# Patient Record
Sex: Female | Born: 1970
Health system: Southern US, Community
[De-identification: ages and names within clinical notes are randomized; demographics above are authoritative.]

## PROBLEM LIST (undated history)

## (undated) DIAGNOSIS — M65332 Trigger finger, left middle finger: Secondary | ICD-10-CM

## (undated) DIAGNOSIS — Z9889 Other specified postprocedural states: Secondary | ICD-10-CM

## (undated) DIAGNOSIS — D3613 Benign neoplasm of peripheral nerves and autonomic nervous system of lower limb, including hip: Secondary | ICD-10-CM

## (undated) DIAGNOSIS — T8859XA Other complications of anesthesia, initial encounter: Secondary | ICD-10-CM

## (undated) DIAGNOSIS — E7800 Pure hypercholesterolemia, unspecified: Secondary | ICD-10-CM

## (undated) DIAGNOSIS — M47812 Spondylosis without myelopathy or radiculopathy, cervical region: Secondary | ICD-10-CM

## (undated) DIAGNOSIS — T7840XA Allergy, unspecified, initial encounter: Secondary | ICD-10-CM

## (undated) DIAGNOSIS — M26609 Unspecified temporomandibular joint disorder, unspecified side: Secondary | ICD-10-CM

## (undated) DIAGNOSIS — K219 Gastro-esophageal reflux disease without esophagitis: Secondary | ICD-10-CM

## (undated) DIAGNOSIS — Z8614 Personal history of Methicillin resistant Staphylococcus aureus infection: Secondary | ICD-10-CM

## (undated) DIAGNOSIS — F329 Major depressive disorder, single episode, unspecified: Secondary | ICD-10-CM

## (undated) DIAGNOSIS — E079 Disorder of thyroid, unspecified: Secondary | ICD-10-CM

## (undated) DIAGNOSIS — R7303 Prediabetes: Secondary | ICD-10-CM

## (undated) DIAGNOSIS — E039 Hypothyroidism, unspecified: Secondary | ICD-10-CM

## (undated) DIAGNOSIS — I10 Essential (primary) hypertension: Secondary | ICD-10-CM

## (undated) DIAGNOSIS — Z1371 Encounter for nonprocreative screening for genetic disease carrier status: Secondary | ICD-10-CM

## (undated) DIAGNOSIS — J309 Allergic rhinitis, unspecified: Secondary | ICD-10-CM

## (undated) DIAGNOSIS — F32A Depression, unspecified: Secondary | ICD-10-CM

## (undated) DIAGNOSIS — R112 Nausea with vomiting, unspecified: Secondary | ICD-10-CM

## (undated) DIAGNOSIS — F909 Attention-deficit hyperactivity disorder, unspecified type: Secondary | ICD-10-CM

## (undated) DIAGNOSIS — K449 Diaphragmatic hernia without obstruction or gangrene: Secondary | ICD-10-CM

## (undated) DIAGNOSIS — T4145XA Adverse effect of unspecified anesthetic, initial encounter: Secondary | ICD-10-CM

## (undated) DIAGNOSIS — D649 Anemia, unspecified: Secondary | ICD-10-CM

## (undated) DIAGNOSIS — E063 Autoimmune thyroiditis: Secondary | ICD-10-CM

## (undated) HISTORY — PX: CARPAL TUNNEL RELEASE: SHX101

## (undated) HISTORY — DX: Allergy, unspecified, initial encounter: T78.40XA

## (undated) HISTORY — DX: Benign neoplasm of peripheral nerves and autonomic nervous system of lower limb, including hip: D36.13

## (undated) HISTORY — DX: Other specified postprocedural states: Z98.890

## (undated) HISTORY — PX: FACIAL RECONSTRUCTION SURGERY: SHX631

## (undated) HISTORY — PX: APPENDECTOMY: SHX54

## (undated) HISTORY — PX: NASAL SINUS SURGERY: SHX719

## (undated) HISTORY — PX: OTHER SURGICAL HISTORY: SHX169

## (undated) HISTORY — DX: Spondylosis without myelopathy or radiculopathy, cervical region: M47.812

## (undated) HISTORY — DX: Depression, unspecified: F32.A

## (undated) HISTORY — PX: KNEE SURGERY: SHX244

## (undated) HISTORY — DX: Unspecified temporomandibular joint disorder, unspecified side: M26.609

## (undated) HISTORY — PX: BREAST SURGERY: SHX581

## (undated) HISTORY — PX: HYSTEROSCOPY WITH D & C: SHX1775

## (undated) HISTORY — PX: OOPHORECTOMY: SHX86

## (undated) HISTORY — DX: Diaphragmatic hernia without obstruction or gangrene: K44.9

## (undated) HISTORY — PX: HALLUX VALGUS CORRECTION: SUR315

## (undated) HISTORY — PX: EXTERNAL FRONTAL ETHMOIDECTOMY: SHX1555

## (undated) HISTORY — DX: Major depressive disorder, single episode, unspecified: F32.9

## (undated) HISTORY — DX: Attention-deficit hyperactivity disorder, unspecified type: F90.9

## (undated) HISTORY — PX: WISDOM TOOTH EXTRACTION: SHX21

## (undated) HISTORY — DX: Disorder of thyroid, unspecified: E07.9

## (undated) HISTORY — PX: DILATION AND CURETTAGE OF UTERUS: SHX78

## (undated) HISTORY — DX: Encounter for nonprocreative screening for genetic disease carrier status: Z13.71

## (undated) HISTORY — PX: METATARSAL OSTEOTOMY: SHX1641

## (undated) HISTORY — PX: CHOLECYSTECTOMY: SHX55

---

## 2001-05-12 DIAGNOSIS — Z9889 Other specified postprocedural states: Secondary | ICD-10-CM

## 2001-05-12 HISTORY — DX: Other specified postprocedural states: Z98.890

## 2004-10-28 ENCOUNTER — Ambulatory Visit: Payer: Self-pay

## 2007-03-22 ENCOUNTER — Ambulatory Visit: Payer: Self-pay

## 2008-01-17 ENCOUNTER — Ambulatory Visit: Payer: Self-pay | Admitting: Otolaryngology

## 2008-11-07 ENCOUNTER — Ambulatory Visit: Payer: Self-pay

## 2009-08-06 ENCOUNTER — Ambulatory Visit: Payer: Self-pay | Admitting: General Surgery

## 2009-10-07 ENCOUNTER — Ambulatory Visit: Payer: Self-pay | Admitting: General Surgery

## 2010-08-12 ENCOUNTER — Ambulatory Visit: Payer: Self-pay

## 2010-08-12 DIAGNOSIS — J069 Acute upper respiratory infection, unspecified: Secondary | ICD-10-CM

## 2010-08-20 ENCOUNTER — Encounter: Payer: Self-pay | Admitting: Internal Medicine

## 2010-08-27 NOTE — Letter (Signed)
Summary: surgeries  surgeries   Imported By: Rosine Beat 08/20/2010 12:24:13  _____________________________________________________________________  External Attachment:    Type:   Image     Comment:   External Document

## 2010-08-27 NOTE — Letter (Signed)
Summary: allergies to medications  allergies to medications   Imported By: Rosine Beat 08/20/2010 12:23:20  _____________________________________________________________________  External Attachment:    Type:   Image     Comment:   External Document

## 2010-08-27 NOTE — Progress Notes (Signed)
Summary: office visit 08/12/10  office visit 08/12/10   Imported By: Erskine Squibb Breitmeier 08/20/2010 12:19:16  _____________________________________________________________________  External Attachment:    Type:   Image     Comment:   External Document

## 2010-08-27 NOTE — Progress Notes (Signed)
Summary: office visit 08/12/10  office visit 08/12/10   Imported By: Erskine Squibb Breitmeier 08/20/2010 12:20:06  _____________________________________________________________________  External Attachment:    Type:   Image     Comment:   External Document

## 2010-09-12 ENCOUNTER — Telehealth: Payer: Self-pay | Admitting: Family Medicine

## 2010-09-14 ENCOUNTER — Telehealth: Payer: Self-pay | Admitting: Family Medicine

## 2010-09-17 NOTE — Progress Notes (Signed)
  Phone Note Call from Patient   Caller: Patient Call For: Dr. Hyacinth Meeker Summary of Call: The patient requested to get a Zpack as she had discussed with Dr. Hyacinth Meeker if symptoms got any worse or didn't improve.  Initial call taken by: Eugenio Hoes,  September 12, 2010 1:34 PM  Follow-up for Phone Call        TRIED TO CALL PATIENT BUT NUMBER WAS DISCONNECTED THERE ARE KNOW OTHER PHONE NUMBERS IN SYSTEM FOR PATIENT  Follow-up by: Eugenio Hoes,  September 12, 2010 1:34 PM

## 2010-09-22 NOTE — Progress Notes (Signed)
  Phone Note Call from Patient   Reason for Call: Insurance Question Summary of Call: Patient was seen by Dr Hyacinth Meeker a week ago but still having problem with sinus. patient came in wanted to know could she still get some medication for her sinus.Marland Kitchen spoke with doctor advised her will send a zpack.. patient stated please send to [facility] phne number is [phone] Initial call taken by: Eugenio Hoes,  September 14, 2010 12:09 PM    New/Updated Medications: AZITHROMYCIN 250 MG TABS (AZITHROMYCIN) take 2 tabs by mouth on day 1, then 1 tab by mouth daily until completed Prescriptions: AZITHROMYCIN 250 MG TABS (AZITHROMYCIN) take 2 tabs by mouth on day 1, then 1 tab by mouth daily until completed  #6 x 0   Entered and Authorized by:   Standley Dakins MD   Signed by:   Standley Dakins MD on 09/14/2010   Method used:   Print then Give to Patient   RxID:   [id number]  It was telephoned to [facility] at [phone] C. Cyndie Mull, MD, CDE, FAAFP

## 2010-09-23 ENCOUNTER — Ambulatory Visit: Payer: Self-pay | Admitting: General Surgery

## 2010-09-24 ENCOUNTER — Ambulatory Visit: Payer: Self-pay | Admitting: General Surgery

## 2011-03-25 ENCOUNTER — Telehealth: Payer: Self-pay | Admitting: Internal Medicine

## 2011-03-25 NOTE — Telephone Encounter (Signed)
Patient has scheduled appt for next wed.

## 2011-03-25 NOTE — Telephone Encounter (Signed)
pitting edmina in both legs/nodlule in neck swollen pt would like to come in tomorrow or Tuesday or wednesday

## 2011-03-31 ENCOUNTER — Ambulatory Visit (INDEPENDENT_AMBULATORY_CARE_PROVIDER_SITE_OTHER): Payer: 59 | Admitting: Internal Medicine

## 2011-03-31 ENCOUNTER — Encounter: Payer: Self-pay | Admitting: Internal Medicine

## 2011-03-31 VITALS — BP 135/88 | HR 76 | Temp 98.4°F | Resp 16 | Ht 62.5 in | Wt 169.5 lb

## 2011-03-31 DIAGNOSIS — R609 Edema, unspecified: Secondary | ICD-10-CM

## 2011-03-31 DIAGNOSIS — R030 Elevated blood-pressure reading, without diagnosis of hypertension: Secondary | ICD-10-CM

## 2011-03-31 DIAGNOSIS — R51 Headache: Secondary | ICD-10-CM

## 2011-03-31 DIAGNOSIS — Z0000 Encounter for general adult medical examination without abnormal findings: Secondary | ICD-10-CM

## 2011-03-31 DIAGNOSIS — N926 Irregular menstruation, unspecified: Secondary | ICD-10-CM

## 2011-03-31 DIAGNOSIS — R635 Abnormal weight gain: Secondary | ICD-10-CM

## 2011-03-31 MED ORDER — TRAMADOL HCL 50 MG PO TABS: 50.0000 mg | ORAL_TABLET | Freq: Four times a day (QID) | ORAL | Status: DC | PRN

## 2011-03-31 MED ORDER — TRIAMTERENE-HCTZ 75-50 MG PO TABS: 1.0000 | ORAL_TABLET | Freq: Every day | ORAL | Status: DC

## 2011-04-01 ENCOUNTER — Encounter: Payer: Self-pay | Admitting: Internal Medicine

## 2011-04-01 LAB — CBC WITH DIFFERENTIAL/PLATELET
Basophils Absolute: 0 10*3/uL (ref 0.0–0.1)
Eosinophils Absolute: 0 10*3/uL (ref 0.0–0.7)
Hemoglobin: 11.1 g/dL — ABNORMAL LOW (ref 12.0–15.0)
Lymphocytes Relative: 24.2 % (ref 12.0–46.0)
MCHC: 32.6 g/dL (ref 30.0–36.0)
Monocytes Relative: 6.1 % (ref 3.0–12.0)
Neutrophils Relative %: 69.3 % (ref 43.0–77.0)
Platelets: 289 10*3/uL (ref 150.0–400.0)
RDW: 14.8 % — ABNORMAL HIGH (ref 11.5–14.6)

## 2011-04-01 LAB — LUTEINIZING HORMONE: LH: 6.57 m[IU]/mL

## 2011-04-01 LAB — LIPID PANEL: Cholesterol: 233 mg/dL — ABNORMAL HIGH (ref 0–200)

## 2011-04-01 LAB — TSH: TSH: 2.27 u[IU]/mL (ref 0.35–5.50)

## 2011-04-01 LAB — COMPREHENSIVE METABOLIC PANEL
AST: 21 U/L (ref 0–37)
BUN: 9 mg/dL (ref 6–23)
Calcium: 8.6 mg/dL (ref 8.4–10.5)
Chloride: 99 mEq/L (ref 96–112)
Creatinine, Ser: 0.6 mg/dL (ref 0.4–1.2)
GFR: 126.13 mL/min (ref >60.00)
Glucose, Bld: 111 mg/dL — ABNORMAL HIGH (ref 70–99)

## 2011-04-01 LAB — POCT URINALYSIS DIPSTICK
Ketones, UA: NEGATIVE
Leukocytes, UA: NEGATIVE
Nitrite, UA: NEGATIVE
Protein, UA: NEGATIVE
Urobilinogen, UA: 0.2

## 2011-04-01 LAB — MICROALBUMIN / CREATININE URINE RATIO
Creatinine,U: 16.9 mg/dL
Microalb Creat Ratio: 0.6 mg/g (ref 0.0–30.0)
Microalb, Ur: 0.1 mg/dL (ref 0.0–1.9)

## 2011-04-01 LAB — FOLLICLE STIMULATING HORMONE: FSH: 11.4 m[IU]/mL

## 2011-04-03 ENCOUNTER — Encounter: Payer: Self-pay | Admitting: Internal Medicine

## 2011-04-03 DIAGNOSIS — I10 Essential (primary) hypertension: Secondary | ICD-10-CM | POA: Insufficient documentation

## 2011-04-03 DIAGNOSIS — Z0000 Encounter for general adult medical examination without abnormal findings: Secondary | ICD-10-CM | POA: Insufficient documentation

## 2011-04-03 DIAGNOSIS — R635 Abnormal weight gain: Secondary | ICD-10-CM | POA: Insufficient documentation

## 2011-04-03 DIAGNOSIS — R609 Edema, unspecified: Secondary | ICD-10-CM | POA: Insufficient documentation

## 2011-04-03 NOTE — Progress Notes (Signed)
  Subjective:    Patient ID: Mandy Torres, female    DOB: 1970/07/15, 40 y.o.   MRN: [id number]  HPI  40 yo white female with history of facial reconstruction surgery, sinus surgery last seen June 2011 for sinusitis presents to reestablish primary care.  Has had increased fatigue, mild LE edema, menstrual irregularity and weight gain over the past year with no endocrine workup thus far.     Review of Systems  Constitutional: Positive for fatigue and unexpected weight change. Negative for fever and chills.  HENT: Negative for hearing loss, ear pain, nosebleeds, congestion, sore throat, facial swelling, rhinorrhea, sneezing, mouth sores, trouble swallowing, neck pain, neck stiffness, voice change, postnasal drip, sinus pressure, tinnitus and ear discharge.   Eyes: Negative for pain, discharge, redness and visual disturbance.  Respiratory: Negative for cough, chest tightness, shortness of breath, wheezing and stridor.   Cardiovascular: Positive for leg swelling. Negative for chest pain and palpitations.  Genitourinary: Positive for menstrual problem.  Musculoskeletal: Negative for myalgias and arthralgias.  Skin: Negative for color change and rash.  Neurological: Negative for dizziness, weakness, light-headedness and headaches.  Hematological: Negative for adenopathy.       Objective:   Physical Exam  Constitutional: She is oriented to person, place, and time. She appears well-developed and well-nourished.  HENT:  Mouth/Throat: Oropharynx is clear and moist.  Eyes: EOM are normal. Pupils are equal, round, and reactive to light. No scleral icterus.  Neck: Normal range of motion. Neck supple. No JVD present. No thyromegaly present.  Cardiovascular: Normal rate, regular rhythm, normal heart sounds and intact distal pulses.   Pulmonary/Chest: Effort normal and breath sounds normal.  Abdominal: Soft. Bowel sounds are normal. She exhibits no mass. There is no tenderness.  Musculoskeletal:  Normal range of motion. She exhibits no edema.  Lymphadenopathy:    She has no cervical adenopathy.  Neurological: She is alert and oriented to person, place, and time.  Skin: Skin is warm and dry.  Psychiatric: She has a normal mood and affect.          Assessment & Plan:

## 2011-04-03 NOTE — Assessment & Plan Note (Signed)
13 lb weight gain noted since last visit. BMI now 30.   Will screen for diabetes, thyroid, consider screening for sleep apnea if she is developing new onset hypertension as well.

## 2011-04-03 NOTE — Assessment & Plan Note (Signed)
New onset LE,  With weight gain and elevated bp concurrent.  Will check urine for protein ,  Consider vascular eval for venous insufficiency given sedentary job.

## 2011-04-09 ENCOUNTER — Telehealth: Payer: Self-pay | Admitting: Internal Medicine

## 2011-04-09 NOTE — Telephone Encounter (Signed)
Patient called and was a little upset because she got the letter from her lab results that we had sent her.   She stated she has a few concerns, it needs to be noted that she was not fasting the day she had her labs done, and she had requested a C Reactive Protein be drawn and it was not, and she also said she let the doctor she works for review her labs and they told her one lab indicates that she could be anemic, and she was wondering why that was not addressed with her.  She also stated she requested that a lab be drawn to see if she was perimenopausal, and she did not see that one either.  Please advise.

## 2011-04-21 ENCOUNTER — Encounter: Payer: Self-pay | Admitting: *Deleted

## 2011-04-22 ENCOUNTER — Encounter: Payer: Self-pay | Admitting: Internal Medicine

## 2011-05-04 ENCOUNTER — Other Ambulatory Visit: Payer: Self-pay | Admitting: Internal Medicine

## 2011-05-04 DIAGNOSIS — R609 Edema, unspecified: Secondary | ICD-10-CM

## 2011-05-04 DIAGNOSIS — N926 Irregular menstruation, unspecified: Secondary | ICD-10-CM

## 2011-05-04 DIAGNOSIS — Z0000 Encounter for general adult medical examination without abnormal findings: Secondary | ICD-10-CM

## 2011-05-04 DIAGNOSIS — R030 Elevated blood-pressure reading, without diagnosis of hypertension: Secondary | ICD-10-CM

## 2011-05-04 DIAGNOSIS — R635 Abnormal weight gain: Secondary | ICD-10-CM

## 2011-05-04 DIAGNOSIS — R51 Headache: Secondary | ICD-10-CM

## 2011-05-04 MED ORDER — TRAMADOL HCL 50 MG PO TABS: 50.0000 mg | ORAL_TABLET | Freq: Four times a day (QID) | ORAL | Status: DC | PRN

## 2011-06-19 ENCOUNTER — Other Ambulatory Visit: Payer: Self-pay

## 2011-06-19 ENCOUNTER — Ambulatory Visit (HOSPITAL_COMMUNITY)
Admission: RE | Admit: 2011-06-19 | Discharge: 2011-06-19 | Disposition: A | Discharge status: Home / Self Care | Payer: 59 | Source: Ambulatory Visit | Attending: Family Medicine | Admitting: Family Medicine

## 2011-06-19 ENCOUNTER — Other Ambulatory Visit (HOSPITAL_COMMUNITY): Payer: Self-pay | Admitting: *Deleted

## 2011-06-19 DIAGNOSIS — M542 Cervicalgia: Secondary | ICD-10-CM | POA: Insufficient documentation

## 2011-06-19 DIAGNOSIS — R52 Pain, unspecified: Secondary | ICD-10-CM

## 2011-06-23 ENCOUNTER — Ambulatory Visit (INDEPENDENT_AMBULATORY_CARE_PROVIDER_SITE_OTHER): Payer: 59 | Admitting: Family Medicine

## 2011-06-23 ENCOUNTER — Encounter: Payer: Self-pay | Admitting: Family Medicine

## 2011-06-23 VITALS — BP 120/72 | HR 85 | Temp 98.5°F | Ht 62.5 in | Wt 160.2 lb

## 2011-06-23 DIAGNOSIS — F329 Major depressive disorder, single episode, unspecified: Secondary | ICD-10-CM

## 2011-06-23 DIAGNOSIS — G43909 Migraine, unspecified, not intractable, without status migrainosus: Secondary | ICD-10-CM

## 2011-06-23 DIAGNOSIS — E785 Hyperlipidemia, unspecified: Secondary | ICD-10-CM

## 2011-06-23 DIAGNOSIS — F909 Attention-deficit hyperactivity disorder, unspecified type: Secondary | ICD-10-CM

## 2011-06-23 DIAGNOSIS — E7849 Other hyperlipidemia: Secondary | ICD-10-CM | POA: Insufficient documentation

## 2011-06-23 DIAGNOSIS — R030 Elevated blood-pressure reading, without diagnosis of hypertension: Secondary | ICD-10-CM

## 2011-06-23 NOTE — Patient Instructions (Addendum)
It was so nice to meet you. Please come in for a nurse visit for fasting labs in 3 months. Have a Merry Christmas.

## 2011-06-23 NOTE — Progress Notes (Signed)
Subjective:    Patient ID: Mandy Torres, female    DOB: 14-Jan-1971, 40 y.o.   MRN: [id number]  HPI   40 yo female, previous pt of Dr. Darrick Huntsman here to establish care.  DJD of spine- per pt, C4-C6.  Followed by neurosurg at [facility]. Now taking Nabumetone 500 mg twice daily, Gabapentin 100 mg three times daily Has follow up scheduled in January.  Less radiculopathy.  Migraine headaches- followed by Dr[facility] Neale [facility]. Couldn't tolerate topamax- caused cognitive issues. Getting trigger point injections, seems to be helping. Taking Compazine as needed for headache.  ADHD- recently diagnosed. Followed by Valinda Hoar. Started Amphetamine yesterday.  HLD- Lab Results  Component Value Date   CHOL 233* 04/01/2011   HDL 50.10 04/01/2011   LDLDIRECT 171.8 04/01/2011   TRIG 140.0 04/01/2011   CHOLHDL 5 04/01/2011   Working on diet. Does not want to take medication if possible.  Patient Active Problem List  Diagnoses  . Weight gain  . Elevated blood pressure reading without diagnosis of hypertension  . Edema  . Routine adult health maintenance  . Hyperlipidemia, familial, high LDL   Past Medical History  Diagnosis Date  . History of breast biopsy 05/2001  . Allergy   . ADHD (attention deficit hyperactivity disorder)     Valinda Hoar  . Depression   . DJD (degenerative joint disease), cervical     neurosurg - Dr. Darcus Austin at [facility]  . Migraine    Past Surgical History  Procedure Date  . Cholecystectomy   . Knee surgery 11/97 and 4/93    right  . Oophorectomy   . Appendectomy   . Nasal sinus surgery 2006 and 1998    deviated septum, ethmoids, Dr. Sherrie Mustache then Willeen Cass  . Facial reconstruction surgery 2/88, 9/88, 2/89  . Breast surgery     normal biopsy 2002, left   History  Substance Use Topics  . Smoking status: Never Smoker   . Smokeless tobacco: Never Used  . Alcohol Use: Yes     social   Family History  Problem Relation Age of Onset  .  Cancer Mother     Breast  . Cancer Sister     Half sister, Breast  . Cancer Maternal Aunt     Breast   Allergies  Allergen Reactions  . Avelox (Moxifloxacin Hcl In Nacl) Hives  . Levaquin Nausea And Vomiting  . Penicillins Hives  . Percocet (Oxycodone-Acetaminophen) Nausea And Vomiting    Also Endocet  . Ranitidine Nausea And Vomiting  . Sulfa Antibiotics Hives  . Topamax     congnitive impairmant  . Valium     Hyperactivity    Current Outpatient Prescriptions on File Prior to Visit  Medication Sig Dispense Refill  . Omeprazole-Sodium Bicarbonate (ZEGERID) 20-1100 MG CAPS Take 1 capsule by mouth daily.         The PMH, PSH, Social History, Family History, Medications, and allergies have been reviewed in [facility], and have been updated if relevant.   Review of Systems  See HPI Constitutional: Negative for fever and chills.  HENT: Negative for hearing loss, ear pain, nosebleeds, congestion, sore throat, facial swelling, rhinorrhea, sneezing, mouth sores, trouble swallowing, neck pain, neck stiffness, voice change, postnasal drip, sinus pressure, tinnitus and ear discharge.   Eyes: Negative for pain, discharge, redness and visual disturbance.  Respiratory: Negative for cough, chest tightness, shortness of breath, wheezing and stridor.   Cardiovascular:  Negative for chest pain and palpitations.  Musculoskeletal:  Negative for myalgias and arthralgias.  Skin: Negative for color change and rash.  Neurological: Negative for dizziness, weakness, light-headedness and headaches.  Hematological: Negative for adenopathy.       Objective:   Physical Exam  BP 120/72  Pulse 85  Temp(Src) 98.5 F (36.9 C) (Oral)  Ht 5' 2.5" (1.588 m)  Wt 160 lb 4 oz (72.689 kg)  BMI 28.84 kg/m2  LMP 05/24/2011  Constitutional: She is oriented to person, place, and time. She appears well-developed and well-nourished.  HENT:  Mouth/Throat: Oropharynx is clear and moist.  Eyes: EOM are normal. Pupils  are equal, round, and reactive to light. No scleral icterus.  Neck: Normal range of motion. Neck supple. No JVD present. No thyromegaly present.  Cardiovascular: Normal rate, regular rhythm, normal heart sounds and intact distal pulses.   Pulmonary/Chest: Effort normal and breath sounds normal.  Abdominal: Soft. Bowel sounds are normal. She exhibits no mass. There is no tenderness.  Musculoskeletal: Normal range of motion. She exhibits no edema.  Lymphadenopathy:    She has no cervical adenopathy.  Neurological: She is alert and oriented to person, place, and time.  Skin: Skin is warm and dry.  Psychiatric: She has a normal mood and affect.     Assessment & Plan:    1. Hyperlipidemia, familial, high LDL  New.  Working on diet. Recheck labs in 3 months. Lipid Profile, Basic metabolic panel  2. Elevated blood pressure reading without diagnosis of hypertension  BP normal today.   3. ADHD (attention deficit hyperactivity disorder)  Just started stimulant.  Keep follow up with Valinda Hoar.   4. Depression  Stable.   5. Migraine  Improved with trigger point injections.

## 2011-08-18 ENCOUNTER — Other Ambulatory Visit (HOSPITAL_COMMUNITY): Payer: Self-pay | Admitting: *Deleted

## 2011-08-18 DIAGNOSIS — K1120 Sialoadenitis, unspecified: Secondary | ICD-10-CM

## 2011-08-23 ENCOUNTER — Other Ambulatory Visit: Payer: Self-pay | Admitting: *Deleted

## 2011-08-23 ENCOUNTER — Other Ambulatory Visit (HOSPITAL_COMMUNITY): Payer: Self-pay | Admitting: *Deleted

## 2011-08-23 ENCOUNTER — Ambulatory Visit (HOSPITAL_COMMUNITY)
Admission: RE | Admit: 2011-08-23 | Discharge: 2011-08-23 | Disposition: A | Discharge status: Home / Self Care | Payer: 59 | Source: Ambulatory Visit | Attending: Family Medicine | Admitting: Family Medicine

## 2011-08-23 ENCOUNTER — Encounter (HOSPITAL_COMMUNITY): Payer: Self-pay

## 2011-08-23 DIAGNOSIS — K1120 Sialoadenitis, unspecified: Secondary | ICD-10-CM

## 2011-08-23 MED ORDER — IOHEXOL 300 MG/ML  SOLN: 75.0000 mL | Freq: Once | INTRAMUSCULAR | Status: AC | PRN

## 2011-08-23 MED ORDER — RIZATRIPTAN BENZOATE 10 MG PO TABS: 10.0000 mg | ORAL_TABLET | ORAL | Status: DC | PRN

## 2011-08-23 NOTE — Telephone Encounter (Signed)
Patient advised via telephone, Rx sent to pharmacy by Dr. Dayton Martes.

## 2011-09-27 ENCOUNTER — Other Ambulatory Visit: Payer: Self-pay | Admitting: *Deleted

## 2011-09-27 MED ORDER — RIZATRIPTAN BENZOATE 10 MG PO TABS: 10.0000 mg | ORAL_TABLET | ORAL | Status: DC | PRN

## 2011-09-27 NOTE — Telephone Encounter (Signed)
Received faxed refill request from pharmacy. Is it okay to refill medication? 

## 2011-11-08 ENCOUNTER — Other Ambulatory Visit: Payer: Self-pay | Admitting: *Deleted

## 2011-11-08 MED ORDER — RIZATRIPTAN BENZOATE 10 MG PO TABS: 10.0000 mg | ORAL_TABLET | ORAL | Status: DC | PRN

## 2011-11-08 NOTE — Telephone Encounter (Signed)
Faxed request from [facility], last filled 09/27/11 for # 9.

## 2011-12-15 ENCOUNTER — Ambulatory Visit: Payer: Self-pay | Admitting: General Surgery

## 2011-12-23 ENCOUNTER — Other Ambulatory Visit (HOSPITAL_COMMUNITY): Payer: Self-pay | Admitting: General Surgery

## 2011-12-23 DIAGNOSIS — R2240 Localized swelling, mass and lump, unspecified lower limb: Secondary | ICD-10-CM

## 2011-12-28 ENCOUNTER — Other Ambulatory Visit: Payer: Self-pay | Admitting: *Deleted

## 2011-12-28 MED ORDER — RIZATRIPTAN BENZOATE 10 MG PO TABS: 10.0000 mg | ORAL_TABLET | ORAL | Status: DC | PRN

## 2011-12-28 NOTE — Telephone Encounter (Signed)
Medication phoned to pharmacy.  

## 2011-12-28 NOTE — Telephone Encounter (Signed)
Received faxed refill request from pharmacy. Is it okay to refill medication? 

## 2011-12-31 ENCOUNTER — Encounter: Payer: Self-pay | Admitting: Family Medicine

## 2011-12-31 ENCOUNTER — Ambulatory Visit (INDEPENDENT_AMBULATORY_CARE_PROVIDER_SITE_OTHER): Payer: 59 | Admitting: Family Medicine

## 2011-12-31 ENCOUNTER — Ambulatory Visit (HOSPITAL_COMMUNITY)
Admission: RE | Admit: 2011-12-31 | Discharge: 2011-12-31 | Disposition: A | Discharge status: Home / Self Care | Payer: 59 | Source: Ambulatory Visit | Attending: General Surgery | Admitting: General Surgery

## 2011-12-31 VITALS — BP 110/84 | HR 56 | Temp 97.9°F | Wt 154.0 lb

## 2011-12-31 DIAGNOSIS — R2240 Localized swelling, mass and lump, unspecified lower limb: Secondary | ICD-10-CM

## 2011-12-31 DIAGNOSIS — K629 Disease of anus and rectum, unspecified: Secondary | ICD-10-CM

## 2011-12-31 DIAGNOSIS — R229 Localized swelling, mass and lump, unspecified: Secondary | ICD-10-CM | POA: Insufficient documentation

## 2011-12-31 DIAGNOSIS — K6289 Other specified diseases of anus and rectum: Secondary | ICD-10-CM

## 2011-12-31 MED ORDER — GADOBENATE DIMEGLUMINE 529 MG/ML IV SOLN
14.0000 mL | Freq: Once | INTRAVENOUS | Status: AC | PRN
  Administered 2011-12-31: 14 mL via INTRAVENOUS

## 2011-12-31 MED ORDER — DOXYCYCLINE HYCLATE 100 MG PO TABS: 100.0000 mg | ORAL_TABLET | Freq: Two times a day (BID) | ORAL | Status: AC

## 2011-12-31 MED ORDER — HYDROCORTISONE 2.5 % RE CREA: TOPICAL_CREAM | Freq: Two times a day (BID) | RECTAL | Status: AC

## 2011-12-31 NOTE — Progress Notes (Signed)
Subjective:    Patient ID: Mandy Torres, female    DOB: January 07, 1971, 41 y.o.   MRN: [id number]  HPI  41 yo here for ?swollen knot on rectum x 1 week.  Not painful or bleeding. She felt it when she was wiping. No h/o hemorrhoids. She does have frequent bouts of constipation.  Never had anything like this in past. Does have h/o MRSA.  Patient Active Problem List  Diagnosis  . Weight gain  . Elevated blood pressure reading without diagnosis of hypertension  . Edema  . Routine adult health maintenance  . Hyperlipidemia, familial, high LDL  . ADHD (attention deficit hyperactivity disorder)  . Depression  . Migraine   Past Medical History  Diagnosis Date  . History of breast biopsy 05/2001  . Allergy   . ADHD (attention deficit hyperactivity disorder)     Valinda Hoar  . Depression   . DJD (degenerative joint disease), cervical     neurosurg - [facility] Darcus Austin at [facility]  . Migraine    Past Surgical History  Procedure Date  . Cholecystectomy   . Knee surgery 11/97 and 4/93    right  . Oophorectomy   . Appendectomy   . Nasal sinus surgery 2006 and 1998    deviated septum, ethmoids, Dr. Sherrie Mustache then Willeen Cass  . Facial reconstruction surgery 2/88, 9/88, 2/89  . Breast surgery     normal biopsy 2002, left   History  Substance Use Topics  . Smoking status: Never Smoker   . Smokeless tobacco: Never Used  . Alcohol Use: Yes     social   Family History  Problem Relation Age of Onset  . Cancer Mother     Breast  . Cancer Sister     Half sister, Breast  . Cancer Maternal Aunt     Breast   Allergies  Allergen Reactions  . Avelox (Moxifloxacin Hcl In Nacl) Hives  . Levofloxacin Nausea And Vomiting  . Penicillins Hives  . Percocet (Oxycodone-Acetaminophen) Nausea And Vomiting    Also Endocet  . Ranitidine Nausea And Vomiting  . Sulfa Antibiotics Hives  . Topamax     congnitive impairmant  . Valium     Hyperactivity    Current Outpatient Prescriptions on File  Prior to Visit  Medication Sig Dispense Refill  . chlorproMAZINE (THORAZINE) 25 MG tablet Take 1-2 tablets three times daily as needed for headache       . gabapentin (NEURONTIN) 100 MG capsule Take 100 mg by mouth 3 (three) times daily.        . methocarbamol (ROBAXIN) 750 MG tablet Take 750 mg by mouth 4 (four) times daily.        . nabumetone (RELAFEN) 500 MG tablet Take 500 mg by mouth 2 (two) times daily.        . norethindrone-ethinyl estradiol (MICROGESTIN,JUNEL,LOESTRIN) 1-20 MG-MCG tablet Take 1 tablet by mouth daily.        Maxwell Caul Bicarbonate (ZEGERID) 20-1100 MG CAPS Take 1 capsule by mouth daily.        . rizatriptan (MAXALT) 10 MG tablet Take 1 tablet (10 mg total) by mouth as needed. May repeat in 2 hours if needed  10 tablet  0  . venlafaxine (EFFEXOR-XR) 75 MG 24 hr capsule Take 75 mg by mouth 3 (three) times daily.         The PMH, PSH, Social History, Family History, Medications, and allergies have been reviewed in [facility], and have been updated if relevant.  Review of Systems See HPI No abdominal pain No changes in her bowels No fevers or chills    Objective:   Physical Exam BP 110/84  Pulse 56  Temp 97.9 F (36.6 C)  Wt 154 lb (69.854 kg)  General:  Well-developed,well-nourished,in no acute distress; alert,appropriate and cooperative throughout examination Head:  normocephalic and atraumatic.   Abdomen:  Bowel sounds positive,abdomen soft and non-tender without masses, organomegaly or hernias noted. Rectal:   No hemorrhoids visible, non palpable internally Area that she is complaining of is just inferior to rectum- when I press, a little tender     Assessment & Plan:   1. Abnormality of rectum    New- no abscess or hemorrhoid visible, very small area that is slightly raised? Will treat with anusol, sitz baths. Multiple abx allergies- will place on Doxy since it is Friday afternoon. If it does develop into an abscess, will need immediate  surgical referral. Pt will call on Monday with an update. The patient indicates understanding of these issues and agrees with the plan.

## 2011-12-31 NOTE — Patient Instructions (Addendum)
Good to see you.  Take warm sitz baths for 20 to 30 minutes, 3 to 4 times per day.   anusol cream to the area at least twice a day  Take antibiotics as directed.  Please call me on Monday with an update.

## 2012-01-03 ENCOUNTER — Telehealth: Payer: Self-pay | Admitting: *Deleted

## 2012-01-03 DIAGNOSIS — K611 Rectal abscess: Secondary | ICD-10-CM

## 2012-01-03 NOTE — Telephone Encounter (Signed)
Pt called to report that she is exactly the same as she was when she saw you on Friday.  She is still doing the sitz baths, no change at all.  Please advise.

## 2012-01-03 NOTE — Telephone Encounter (Signed)
Ok thanks for update.  Let's refer to surgery for possible peri rectal abscess.

## 2012-01-07 ENCOUNTER — Ambulatory Visit: Payer: 59 | Admitting: Family Medicine

## 2012-01-31 ENCOUNTER — Other Ambulatory Visit: Payer: Self-pay | Admitting: *Deleted

## 2012-01-31 MED ORDER — RIZATRIPTAN BENZOATE 10 MG PO TABS: 10.0000 mg | ORAL_TABLET | ORAL | Status: DC | PRN

## 2012-01-31 NOTE — Telephone Encounter (Signed)
Faxed refill request from [facility], last filled 12/28/11.

## 2012-02-21 ENCOUNTER — Other Ambulatory Visit: Payer: Self-pay | Admitting: *Deleted

## 2012-02-21 MED ORDER — RIZATRIPTAN BENZOATE 10 MG PO TABS: 10.0000 mg | ORAL_TABLET | ORAL | Status: DC | PRN

## 2012-02-21 NOTE — Telephone Encounter (Signed)
Faxed refill request from [facility] [place].  Last filled 11/08/11.

## 2012-04-07 ENCOUNTER — Other Ambulatory Visit: Payer: Self-pay | Admitting: *Deleted

## 2012-04-07 MED ORDER — RIZATRIPTAN BENZOATE 10 MG PO TABS: 10.0000 mg | ORAL_TABLET | ORAL | Status: DC | PRN

## 2012-04-07 NOTE — Telephone Encounter (Signed)
Faxed refill request from [facility] [place], last filled 02/23/12.

## 2012-05-22 ENCOUNTER — Other Ambulatory Visit: Payer: Self-pay | Admitting: *Deleted

## 2012-05-22 MED ORDER — RIZATRIPTAN BENZOATE 10 MG PO TABS: 10.0000 mg | ORAL_TABLET | ORAL | Status: DC | PRN

## 2012-10-13 ENCOUNTER — Encounter: Payer: Self-pay | Admitting: Family Medicine

## 2012-10-13 ENCOUNTER — Ambulatory Visit (INDEPENDENT_AMBULATORY_CARE_PROVIDER_SITE_OTHER): Payer: 59 | Admitting: Family Medicine

## 2012-10-13 VITALS — BP 130/84 | HR 72 | Temp 98.2°F | Wt 169.0 lb

## 2012-10-13 DIAGNOSIS — F329 Major depressive disorder, single episode, unspecified: Secondary | ICD-10-CM

## 2012-10-13 DIAGNOSIS — E7849 Other hyperlipidemia: Secondary | ICD-10-CM

## 2012-10-13 DIAGNOSIS — G43909 Migraine, unspecified, not intractable, without status migrainosus: Secondary | ICD-10-CM

## 2012-10-13 DIAGNOSIS — F909 Attention-deficit hyperactivity disorder, unspecified type: Secondary | ICD-10-CM

## 2012-10-13 DIAGNOSIS — Z131 Encounter for screening for diabetes mellitus: Secondary | ICD-10-CM

## 2012-10-13 DIAGNOSIS — E785 Hyperlipidemia, unspecified: Secondary | ICD-10-CM

## 2012-10-13 LAB — LIPID PANEL
Cholesterol: 213 mg/dL — ABNORMAL HIGH (ref 0–200)
Triglycerides: 167 mg/dL — ABNORMAL HIGH (ref <150)
VLDL: 33 mg/dL (ref 0–40)

## 2012-10-13 LAB — COMPREHENSIVE METABOLIC PANEL
AST: 19 U/L (ref 0–37)
Alkaline Phosphatase: 68 U/L (ref 39–117)
BUN: 8 mg/dL (ref 6–23)
Calcium: 9.2 mg/dL (ref 8.4–10.5)
Chloride: 99 mEq/L (ref 96–112)
Creat: 0.7 mg/dL (ref 0.50–1.10)

## 2012-10-13 MED ORDER — RIZATRIPTAN BENZOATE 10 MG PO TABS: 10.0000 mg | ORAL_TABLET | ORAL | Status: DC | PRN

## 2012-10-13 NOTE — Progress Notes (Signed)
Subjective:    Patient ID: Mandy Torres, female    DOB: 08-Jan-1971, 42 y.o.   MRN: [id number]  HPI   Very pleasant 42 yo female here for rx refills/follow up.     Migraine headaches- was followed by Mandy Torres [facility] but no longer seeing him. Felt the injections were no longer helping. Could not tolerate topamax- caused cognitive issues. Takes Maxalt for abortive therapy.  Was under good control until last 3-4 weeks, have several per week.  Can be associated with nauseated, mild photophobia.  She has been under more stress lately- office just started Epic and her aunt has metastatic breast CA.    Was recently started and Lamictal and Effexor for mood disorder.  Followed by Mandy Torres.  Feels this is improving her mood.  HLD- LDL quite high when last checked in 03/2011- she wanted to work on diet and did not return for follow up labs. Lab Results  Component Value Date   CHOL 233* 04/01/2011   HDL 50.10 04/01/2011   LDLDIRECT 171.8 04/01/2011   TRIG 140.0 04/01/2011   CHOLHDL 5 04/01/2011    Patient Active Problem List  Diagnosis  . Weight gain  . Elevated blood pressure reading without diagnosis of hypertension  . Edema  . Hyperlipidemia, familial, high LDL  . ADHD (attention deficit hyperactivity disorder)  . Depression  . Migraine  . Abnormality of rectum   Past Medical History  Diagnosis Date  . History of breast biopsy 05/2001  . Allergy   . ADHD (attention deficit hyperactivity disorder)     Mandy Torres  . Depression   . DJD (degenerative joint disease), cervical     neurosurg - [facility] Darcus Torres at [facility]  . Migraine    Past Surgical History  Procedure Laterality Date  . Cholecystectomy    . Knee surgery  11/97 and 4/93    right  . Oophorectomy    . Appendectomy    . Nasal sinus surgery  2006 and 1998    deviated septum, ethmoids, Dr. Sherrie Torres then Mandy Torres  . Facial reconstruction surgery  2/88, 9/88, 2/89  . Breast surgery      normal  biopsy 2002, left   History  Substance Use Topics  . Smoking status: Never Smoker   . Smokeless tobacco: Never Used  . Alcohol Use: Yes     Comment: social   Family History  Problem Relation Age of Onset  . Cancer Mother     Breast  . Cancer Sister     Half sister, Breast  . Cancer Maternal Aunt     Breast   Allergies  Allergen Reactions  . Avelox (Moxifloxacin Hcl In Nacl) Hives  . Levofloxacin Nausea And Vomiting  . Penicillins Hives  . Percocet (Oxycodone-Acetaminophen) Nausea And Vomiting    Also Endocet  . Ranitidine Nausea And Vomiting  . Sulfa Antibiotics Hives  . Topamax     congnitive impairmant  . Valium     Hyperactivity    Current Outpatient Prescriptions on File Prior to Visit  Medication Sig Dispense Refill  . chlorproMAZINE (THORAZINE) 25 MG tablet Take 1-2 tablets three times daily as needed for headache       . gabapentin (NEURONTIN) 100 MG capsule Take 100 mg by mouth 3 (three) times daily.        . methocarbamol (ROBAXIN) 750 MG tablet Take 750 mg by mouth 4 (four) times daily.        . nabumetone (RELAFEN)  500 MG tablet Take 500 mg by mouth 2 (two) times daily.        . norethindrone-ethinyl estradiol (MICROGESTIN,JUNEL,LOESTRIN) 1-20 MG-MCG tablet Take 1 tablet by mouth daily.        Mandy Torres Bicarbonate (ZEGERID) 20-1100 MG CAPS Take 1 capsule by mouth daily.        . rizatriptan (MAXALT) 10 MG tablet Take 1 tablet (10 mg total) by mouth as needed. May repeat in 2 hours if needed  9 tablet  3  . venlafaxine (EFFEXOR-XR) 75 MG 24 hr capsule Take 75 mg by mouth 3 (three) times daily.         No current facility-administered medications on file prior to visit.   The PMH, PSH, Social History, Family History, Medications, and allergies have been reviewed in [facility], and have been updated if relevant.   Review of Systems  See HPI Constitutional: Negative for fever and chills.  Eyes: Negative for pain, discharge, redness and visual disturbance.   Respiratory: Negative for cough, chest tightness, shortness of breath, wheezing and stridor.   Cardiovascular:  Negative for chest pain and palpitations.  Musculoskeletal: Negative for myalgias and arthralgias.       Objective:   Physical Exam  BP 130/84  Pulse 72  Temp(Src) 98.2 F (36.8 C)  Wt 169 lb (76.658 kg)  BMI 30.4 kg/m2  Constitutional: She is oriented to person, place, and time. She appears well-developed and well-nourished.  HENT:  Mouth/Throat: Oropharynx is clear and moist.  Eyes: EOM are normal. Pupils are equal, round, and reactive to light. No scleral icterus.  Neck: Normal range of motion. Neck supple. No JVD present. No thyromegaly present.  Cardiovascular: Normal rate, regular rhythm, normal heart sounds and intact distal pulses.   Pulmonary/Chest: Effort normal and breath sounds normal.  Abdominal: Soft. Bowel sounds are normal. She exhibits no mass. There is no tenderness.  Musculoskeletal: Normal range of motion. She exhibits no edema.  Lymphadenopathy:    She has no cervical adenopathy.  Neurological: She is alert and oriented to person, place, and time.  Skin: Skin is warm and dry.  Psychiatric: She has a normal mood and affect.     Assessment & Plan:   1. Depression Improved with psychotherapy, lamictal and effexor. I have asked her to send records from her psychiatrist office. The patient indicates understanding of these issues and agrees with the plan.   2. Hyperlipidemia, familial, high LDL Recheck labs today. - Comprehensive metabolic panel - Lipid Panel  3. Migraine Deteriorated.  Likely due to increased stressors and recent addition of SSRI and Lamictal.  She would like to give it a little more time, call back in 3 weeks if migraines continue to remain as frequent.  Cautioned about taking maxalt too frequently as it can lead to rebound headaches. The patient indicates understanding of these issues and agrees with the plan.   4.  Screening for diabetes mellitus  - Hemoglobin A1c  5.  Mood disorder- See #1

## 2012-10-13 NOTE — Patient Instructions (Signed)
Good to see you. Please call me in a few weeks with an update of your migraines.

## 2012-10-16 ENCOUNTER — Encounter: Payer: Self-pay | Admitting: Family Medicine

## 2013-02-28 ENCOUNTER — Other Ambulatory Visit: Payer: Self-pay | Admitting: *Deleted

## 2013-02-28 MED ORDER — RIZATRIPTAN BENZOATE 10 MG PO TABS: 10.0000 mg | ORAL_TABLET | ORAL | Status: DC | PRN

## 2013-02-28 NOTE — Telephone Encounter (Signed)
Faxed refill request from [facility], last filled 01/30/13.

## 2013-03-02 NOTE — Telephone Encounter (Signed)
Pt called to ck on status of refill; spoke with Laurelyn Sickle at [facility] pharmacy and did not receive refill; Medication phoned to [facility] pharmacy as instructed.Called Greggory Stallion at [facility] and cancelled rx there. Apologized to pt.

## 2013-04-11 ENCOUNTER — Ambulatory Visit: Payer: Self-pay

## 2013-05-04 ENCOUNTER — Ambulatory Visit: Payer: 59

## 2013-05-04 ENCOUNTER — Ambulatory Visit (INDEPENDENT_AMBULATORY_CARE_PROVIDER_SITE_OTHER): Payer: 59

## 2013-05-04 VITALS — BP 142/99 | HR 86 | Resp 18

## 2013-05-04 DIAGNOSIS — M79671 Pain in right foot: Secondary | ICD-10-CM

## 2013-05-04 DIAGNOSIS — M79672 Pain in left foot: Secondary | ICD-10-CM

## 2013-05-04 DIAGNOSIS — M79609 Pain in unspecified limb: Secondary | ICD-10-CM

## 2013-05-04 DIAGNOSIS — G5760 Lesion of plantar nerve, unspecified lower limb: Secondary | ICD-10-CM

## 2013-05-04 MED ORDER — MELOXICAM 15 MG PO TABS: 15.0000 mg | ORAL_TABLET | Freq: Every day | ORAL | Status: DC

## 2013-05-04 MED ORDER — TRIAMCINOLONE ACETONIDE 10 MG/ML IJ SUSP: 10.0000 mg | Freq: Once | INTRAMUSCULAR | Status: DC

## 2013-05-04 NOTE — Progress Notes (Signed)
  Subjective:    Patient ID: Mandy Torres, female    DOB: 23-Mar-1971, 42 y.o.   MRN: [id number] Both feet hurt and has been going on for 2 years and has inserts and hurts in the ball of both feet HPI patient presents this time for followup continues to have pain second third toes consistent with neuroma symptomology left foot and now is having problems in the right foot as well. Patient also having shin splint type symptomatology with activities which she addressed by stretching before activities.    Review of Systems  Constitutional: Negative.   HENT: Negative.   Eyes: Negative.   Respiratory: Negative.   Cardiovascular: Negative.   Gastrointestinal:       Reflux and a herinal hernia  Endocrine: Negative.   Genitourinary: Negative.   Musculoskeletal: Positive for back pain and neck pain.       Degenerative neck disease  Skin: Negative.   Allergic/Immunologic: Negative.   Neurological: Positive for numbness.       Carpel tunnel and i have migraines  Hematological: Negative.   Psychiatric/Behavioral:       Hyertension difficent-ADHD       Objective:   Physical Exam Neurovascular status is intact with pedal pulses palpable DP and PT +2/4. Refill timed 3-4 seconds bilateral. Skin temperature warm turgor normal no edema noted. There is pain and draped lateral compression second and third interspaces most significant second lateral x-rays reveal narrowing of the second interspace and touching the second third metatarsals on AP and oblique views. There is some slight splaying of the second and third toes on x-ray and clinical evaluation. These findings are consistent with early neuroma symptomology. Patient did try to attempt Mobic has been using the knee nabumetone continues to have pain with activities. Has been wearing orthotics which she brought with her for modification at this time.       Assessment & Plan:  Assessment persistent neuropsychology bilateral feet. Metatarsal pads  applied to the orthotics at this time. Steroid injection consisting of 10 mg Kenalog in 20 mg Xylocaine infiltrated to the second interspace bilateral feet. Recommend followup in place also prescription for Mobic 15 mg once daily we'll discontinue the Relafen. Followup in approximately 2 months for reevaluation may be candidate for steroid or alcohol sclerosing injections injections in the future.  Alvan Dame DPM

## 2013-05-04 NOTE — Addendum Note (Signed)
Addended by: Shelly Bombard on: 05/04/2013 03:21 PM   Modules accepted: Orders, Medications

## 2013-05-04 NOTE — Patient Instructions (Signed)
Morton's Neuroma Neuralgia (nerve pain) or neuroma (benign [non-cancerous] nerve tumor) may develop on any interdigital nerve. The interdigital nerves (nerves between digits) of the foot travel beneath and between the metatarsals (long bones of the fore foot) and pass the nerve endings to the toes. The third interdigital is a common place for a small neuroma to form called Morton's neuroma. Another nerve to be affected commonly is the fourth interdigital nerve. This would be in approximately in the area of the base or ball under the bottom of your fourth toe. This condition occurs more commonly in women and is usually on one side. It is usually first noticed by pain radiating (spreading) to the ball of the foot or to the toes. CAUSES The cause of interdigital neuralgia may be from low grade repetitive trauma (damage caused by an accident) as in activities causing a repeated pounding of the foot (running, jumping etc.). It is also caused by improper footwear or recent loss of the fatty padding on the bottom of the foot. TREATMENT  The condition often resolves (goes away) simply with decreasing activity if that is thought to be the cause. Proper shoes are beneficial. Orthotics (special foot support aids) such as a metatarsal bar are often beneficial. This condition usually responds to conservative therapy, however if surgery is necessary it usually brings complete relief. HOME CARE INSTRUCTIONS   Apply ice to the area of soreness for 15-20 minutes, 3-4 times per day, while awake for the first 2 days. Put ice in a plastic bag and place a towel between the bag of ice and your skin.  Only take over-the-counter or prescription medicines for pain, discomfort, or fever as directed by your caregiver. MAKE SURE YOU:   Understand these instructions.  Will watch your condition.  Will get help right away if you are not doing well or get worse. Document Released: 10/04/2000 Document Revised: 09/20/2011 Document  Reviewed: 06/28/2005 [employer] Patient Information 2014 [employer] ICE INSTRUCTIONS  Apply ice or cold pack to the affected area at least 3 times a day for 10-15 minutes each time.  You should also use ice after prolonged activity or vigorous exercise.  Do not apply ice longer than 20 minutes at one time.  Always keep a cloth between your skin and the ice pack to prevent burns.  Being consistent and following these instructions will help control your symptoms.  We suggest you purchase a gel ice pack because they are reusable and do bit leak.  Some of them are designed to wrap around the area.  Use the method that works best for you.  Here are some other suggestions for icing.   Use a frozen bag of peas or corn-inexpensive and molds well to your body, usually stays frozen for 10 to 20 minutes.  Wet a towel with cold water and squeeze out the excess until it's damp.  Place in a bag in the freezer for 20 minutes. Then remove and use. 

## 2013-06-26 ENCOUNTER — Ambulatory Visit (INDEPENDENT_AMBULATORY_CARE_PROVIDER_SITE_OTHER): Payer: 59 | Admitting: Internal Medicine

## 2013-06-26 ENCOUNTER — Encounter: Payer: Self-pay | Admitting: Internal Medicine

## 2013-06-26 VITALS — BP 124/82 | HR 95 | Temp 97.6°F | Wt 178.5 lb

## 2013-06-26 DIAGNOSIS — J019 Acute sinusitis, unspecified: Secondary | ICD-10-CM

## 2013-06-26 DIAGNOSIS — B379 Candidiasis, unspecified: Secondary | ICD-10-CM

## 2013-06-26 MED ORDER — CEFDINIR 300 MG PO CAPS: 300.0000 mg | ORAL_CAPSULE | Freq: Two times a day (BID) | ORAL | Status: DC

## 2013-06-26 MED ORDER — FLUCONAZOLE 150 MG PO TABS: 150.0000 mg | ORAL_TABLET | Freq: Once | ORAL | Status: DC

## 2013-06-26 NOTE — Patient Instructions (Signed)

## 2013-06-26 NOTE — Progress Notes (Signed)
Pre-visit discussion using our clinic review tool. No additional management support is needed unless otherwise documented below in the visit note.  

## 2013-06-26 NOTE — Progress Notes (Signed)
HPI  Pt presents to the clinic today with c/o sinus congestion and cough. This started about 9 days ago. She did see ENT 2 weeks ago for TMJ, she was given steroids at that time. She does have a headache, postnasal drip and yellow/brown nasal drainage. She has tried dayquil, nyquil, and honey with lemon juice without much relief. She denies fever, chills or body aches. She does have a history of allergies but not asthma. She is taking Patanase for her allergies. She has not had sick contacts.  Review of Systems    Past Medical History  Diagnosis Date  . History of breast biopsy 05/2001  . Allergy   . ADHD (attention deficit hyperactivity disorder)     Mandy Torres  . Depression   . DJD (degenerative joint disease), cervical     neurosurg - Dr. Darcus Austin at [facility]  . Migraine     Family History  Problem Relation Age of Onset  . Cancer Mother     Breast  . Cancer Sister     Half sister, Breast  . Cancer Maternal Aunt     Breast    History   Social History  . Marital Status: Single    Spouse Name: N/A    Number of Children: N/A  . Years of Education: N/A   Occupational History  . Not on file.   Social History Main Topics  . Smoking status: Never Smoker   . Smokeless tobacco: Never Used  . Alcohol Use: Yes     Comment: social  . Drug Use: No  . Sexual Activity: Not on file   Other Topics Concern  . Not on file   Social History Narrative  . No narrative on file    Allergies  Allergen Reactions  . Avelox [Moxifloxacin Hcl In Nacl] Hives  . Endocet [Oxycodone-Acetaminophen]   . Levofloxacin Nausea And Vomiting  . Penicillins Hives  . Percocet [Oxycodone-Acetaminophen] Nausea And Vomiting    Also Endocet  . Ranitidine Nausea And Vomiting  . Sulfa Antibiotics Hives  . Topamax     congnitive impairmant  . Valium     Hyperactivity      Constitutional: Positive headache, fatigue and fever. Denies fever or abrupt weight changes.  HEENT:  Positive eye pain,  pressure behind the eyes, facial pain, nasal congestion and sore throat. Denies eye redness, ear pain, ringing in the ears, wax buildup, runny nose or bloody nose. Respiratory: Positive cough. Denies difficulty breathing or shortness of breath.  Cardiovascular: Denies chest pain, chest tightness, palpitations or swelling in the hands or feet.   No other specific complaints in a complete review of systems (except as listed in HPI above).  Objective:    BP 124/82  Pulse 95  Temp(Src) 97.6 F (36.4 C) (Oral)  Wt 178 lb 8 oz (80.967 kg)  SpO2 98% Wt Readings from Last 3 Encounters:  06/26/13 178 lb 8 oz (80.967 kg)  10/13/12 169 lb (76.658 kg)  12/31/11 154 lb (69.854 kg)    General: Appears her stated age, well developed, well nourished in NAD. HEENT: Head: normal shape and size; Eyes: sclera white, no icterus, conjunctiva pink, PERRLA and EOMs intact; Ears: Tm's gray and intact, normal light reflex; Nose: mucosa pink and moist, septum midline; Throat/Mouth: + PND. Teeth present, mucosa pink and moist, no exudate noted, no lesions or ulcerations noted.  Neck: Mild cervical lymphadenopathy. Neck supple, trachea midline. No massses, lumps or thyromegaly present.  Cardiovascular: Normal rate and rhythm. S1,S2  noted.  No murmur, rubs or gallops noted. No JVD or BLE edema. No carotid bruits noted. Pulmonary/Chest: Normal effort and positive vesicular breath sounds. No respiratory distress. No wheezes, rales or ronchi noted.      Assessment & Plan:   Acute bacterial sinusitis  Can use a Neti Pot which can be purchased from your local drug store. Continue Patanase daily eRx for Omnicef 300 mg BID x 10 days eRx for Diflucan for antibiotic induced yeast infection  RTC as needed or if symptoms persist.

## 2013-07-03 ENCOUNTER — Telehealth: Payer: Self-pay | Admitting: *Deleted

## 2013-07-03 NOTE — Telephone Encounter (Signed)
RECEIVED PHARMACY REQUEST REFILL FOR MELOXICAM 15 MG

## 2013-07-20 ENCOUNTER — Ambulatory Visit: Payer: 59

## 2013-07-27 ENCOUNTER — Ambulatory Visit: Payer: 59 | Admitting: Family Medicine

## 2013-07-31 ENCOUNTER — Ambulatory Visit: Payer: 59

## 2013-08-02 ENCOUNTER — Telehealth: Payer: Self-pay | Admitting: *Deleted

## 2013-08-02 MED ORDER — MELOXICAM 15 MG PO TABS: 15.0000 mg | ORAL_TABLET | Freq: Every day | ORAL | Status: DC

## 2013-08-02 NOTE — Telephone Encounter (Signed)
Patient requested refill on meloxicam. Advised pt that she needed a follow up appt before refill. Pt states that she has a follow up in Feb, so I did refill just this time.

## 2013-08-03 ENCOUNTER — Ambulatory Visit (INDEPENDENT_AMBULATORY_CARE_PROVIDER_SITE_OTHER): Payer: 59 | Admitting: Family Medicine

## 2013-08-03 ENCOUNTER — Encounter: Payer: Self-pay | Admitting: Family Medicine

## 2013-08-03 VITALS — BP 142/92 | HR 97 | Temp 98.3°F | Ht 62.5 in | Wt 183.5 lb

## 2013-08-03 DIAGNOSIS — E785 Hyperlipidemia, unspecified: Secondary | ICD-10-CM

## 2013-08-03 DIAGNOSIS — G43909 Migraine, unspecified, not intractable, without status migrainosus: Secondary | ICD-10-CM

## 2013-08-03 DIAGNOSIS — E7849 Other hyperlipidemia: Secondary | ICD-10-CM

## 2013-08-03 DIAGNOSIS — R7303 Prediabetes: Secondary | ICD-10-CM

## 2013-08-03 DIAGNOSIS — R7309 Other abnormal glucose: Secondary | ICD-10-CM

## 2013-08-03 DIAGNOSIS — R635 Abnormal weight gain: Secondary | ICD-10-CM

## 2013-08-03 LAB — T4, FREE: Free T4: 0.74 ng/dL — ABNORMAL LOW (ref 0.80–1.80)

## 2013-08-03 LAB — LIPID PANEL
CHOL/HDL RATIO: 4.5 ratio
Cholesterol: 207 mg/dL — ABNORMAL HIGH (ref 0–200)
HDL: 46 mg/dL (ref >39)
LDL Cholesterol: 120 mg/dL — ABNORMAL HIGH (ref 0–99)
Triglycerides: 203 mg/dL — ABNORMAL HIGH (ref <150)
VLDL: 41 mg/dL — ABNORMAL HIGH (ref 0–40)

## 2013-08-03 LAB — COMPREHENSIVE METABOLIC PANEL
ALBUMIN: 4.1 g/dL (ref 3.5–5.2)
ALK PHOS: 78 U/L (ref 39–117)
ALT: 18 U/L (ref 0–35)
AST: 19 U/L (ref 0–37)
BUN: 8 mg/dL (ref 6–23)
CALCIUM: 8.8 mg/dL (ref 8.4–10.5)
CHLORIDE: 100 meq/L (ref 96–112)
CO2: 26 mEq/L (ref 19–32)
Creat: 0.77 mg/dL (ref 0.50–1.10)
GLUCOSE: 92 mg/dL (ref 70–99)
POTASSIUM: 4.3 meq/L (ref 3.5–5.3)
SODIUM: 135 meq/L (ref 135–145)
TOTAL PROTEIN: 6.5 g/dL (ref 6.0–8.3)
Total Bilirubin: 0.2 mg/dL — ABNORMAL LOW (ref 0.3–1.2)

## 2013-08-03 LAB — TSH: TSH: 3.694 u[IU]/mL (ref 0.350–4.500)

## 2013-08-03 MED ORDER — AMITRIPTYLINE HCL 10 MG PO TABS: 10.0000 mg | ORAL_TABLET | Freq: Every evening | ORAL | Status: DC | PRN

## 2013-08-03 NOTE — Assessment & Plan Note (Signed)
Deteriorated, likely due to stress. Will try elavil 10 mg qhs for preventative therapy.  Continue maxalt for abortive therapy- cautioned on overuse.  Will refer to Dr. Tomi Likens, neurology for further evaluation/treatment.

## 2013-08-03 NOTE — Assessment & Plan Note (Signed)
Recheck labs today. 

## 2013-08-03 NOTE — Patient Instructions (Addendum)
Good to see you.  We are starting elavil 10 mg at bedtime.  Please stop by to see Rosaria Ferries on your way out to set up your referrals.  I will call you with your lab results.  I am so sorry for your loss.

## 2013-08-03 NOTE — Progress Notes (Signed)
Pre-visit discussion using our clinic review tool. No additional management support is needed unless otherwise documented below in the visit note.  

## 2013-08-03 NOTE — Progress Notes (Signed)
Subjective:    Patient ID: Mandy Torres, female    DOB: 09/16/1970, 43 y.o.   MRN: [id number]  HPI  Very pleasant 43 yo female here to discuss getting lab work and referrals.   Migraine headaches- was followed by Dr. Domingo Cocking [facility] but no longer seeing him. Felt the injections were no longer helping.  Could not tolerate topamax- caused cognitive issues.  Takes Maxalt for abortive therapy.  Can be associated with nauseated, mild photophobia. Deteriorated two months ago- after her aunt died (who she lived with).  Having almost daily headaches.  HLD- has not had labs checked in a while.  Would like lab work today.  Obesity- has gained weight.  Tries to make good choices but thinks she needs to see a nutritionist to really help her lose weight for good.  Patient Active Problem List   Diagnosis Date Noted  . Abnormality of rectum 12/31/2011  . Hyperlipidemia, familial, high LDL 06/23/2011  . ADHD (attention deficit hyperactivity disorder)   . Depression   . Migraine   . Weight gain 04/03/2011  . Elevated blood pressure reading without diagnosis of hypertension 04/03/2011   Past Medical History  Diagnosis Date  . History of breast biopsy 05/2001  . Allergy   . ADHD (attention deficit hyperactivity disorder)     [name]  . Depression   . DJD (degenerative joint disease), cervical     neurosurg - Dr. Wendall Stade at [facility]  . Migraine    Past Surgical History  Procedure Laterality Date  . Cholecystectomy    . Knee surgery  11/97 and 4/93    right  . Oophorectomy    . Appendectomy    . Nasal sinus surgery  2006 and 1998    deviated septum, ethmoids, Dr. Caryn Section then Richardson Landry  . Facial reconstruction surgery  2/88, 9/88, 2/89  . Breast surgery      normal biopsy 2002, left   History  Substance Use Topics  . Smoking status: Never Smoker   . Smokeless tobacco: Never Used  . Alcohol Use: Yes     Comment: social   Family History  Problem  Relation Age of Onset  . Cancer Mother     Breast  . Cancer Sister     Half sister, Breast  . Cancer Maternal Aunt     Breast   Allergies  Allergen Reactions  . Avelox [Moxifloxacin Hcl In Nacl] Hives  . Endocet [Oxycodone-Acetaminophen]   . Levofloxacin Nausea And Vomiting  . Penicillins Hives  . Percocet [Oxycodone-Acetaminophen] Nausea And Vomiting    Also Endocet  . Ranitidine Nausea And Vomiting  . Sulfa Antibiotics Hives  . Topamax     congnitive impairmant  . Valium     Hyperactivity    Current Outpatient Prescriptions on File Prior to Visit  Medication Sig Dispense Refill  . Ascorbic Acid (VITAMIN C) 1000 MG tablet Take 1,000 mg by mouth daily.      . cetirizine (ZYRTEC) 10 MG tablet Take 10 mg by mouth daily.      Marland Kitchen lamoTRIgine (LAMICTAL) 100 MG tablet Take 100 mg by mouth daily.      Marland Kitchen loratadine (CLARITIN) 10 MG tablet Take 10 mg by mouth daily.      . meloxicam (MOBIC) 15 MG tablet Take 1 tablet (15 mg total) by mouth daily.  30 tablet  0  . methocarbamol (ROBAXIN) 750 MG tablet Take 750 mg by mouth 4 (four) times daily.        Marland Kitchen  norethindrone-ethinyl estradiol (MICROGESTIN,JUNEL,LOESTRIN) 1-20 MG-MCG tablet Take 1 tablet by mouth daily.        Earney Navy Bicarbonate (ZEGERID) 20-1100 MG CAPS Take 1 capsule by mouth daily.        . rizatriptan (MAXALT) 10 MG tablet Take 1 tablet (10 mg total) by mouth as needed. May repeat in 2 hours if needed  9 tablet  3  . venlafaxine (EFFEXOR-XR) 75 MG 24 hr capsule Take 75 mg by mouth 3 (three) times daily.         Current Facility-Administered Medications on File Prior to Visit  Medication Dose Route Frequency Provider Last Rate Last Dose  . triamcinolone acetonide (KENALOG) 10 MG/ML injection 10 mg  10 mg Intramuscular Once Harriet Masson, DPM      . triamcinolone acetonide (KENALOG) 10 MG/ML injection 10 mg  10 mg Intramuscular Once Harriet Masson, DPM       The PMH, PSH, Social History, Family History,  Medications, and allergies have been reviewed in [facility], and have been updated if relevant.   Review of Systems See HPI Denies anxiety or depression- feels she is coping with aunt's loss the best she can.    Objective:   Physical Exam BP Readings from Last 3 Encounters:  06/26/13 124/82  05/04/13 142/99  10/13/12 130/84  BP 142/92  Pulse 97  Temp(Src) 98.3 F (36.8 C) (Oral)  Ht 5' 2.5" (1.588 m)  Wt 183 lb 8 oz (83.235 kg)  BMI 33.01 kg/m2  SpO2 97%  Gen:  Alert, pleasant, NAD Neuro:  Normal gait, CN II-XII grossly intact Psych:  Good eye contact, not anxious or depressed appearing       Assessment & Plan:

## 2013-08-03 NOTE — Assessment & Plan Note (Signed)
Wt Readings from Last 3 Encounters:  08/03/13 183 lb 8 oz (83.235 kg)  06/26/13 178 lb 8 oz (80.967 kg)  10/13/12 169 lb (76.658 kg)   Deteriorated.  Refer to nutritionist.

## 2013-08-04 LAB — HEMOGLOBIN A1C
HEMOGLOBIN A1C: 5.8 % — AB (ref <5.7)
Mean Plasma Glucose: 120 mg/dL — ABNORMAL HIGH (ref <117)

## 2013-08-06 ENCOUNTER — Telehealth: Payer: Self-pay | Admitting: *Deleted

## 2013-08-06 NOTE — Telephone Encounter (Signed)
RECEIVED FAXED REFILL REQUEST FOR MELOXICAM 15 MG

## 2013-08-07 MED ORDER — MELOXICAM 15 MG PO TABS: 15.0000 mg | ORAL_TABLET | Freq: Every day | ORAL | Status: DC

## 2013-08-07 NOTE — Telephone Encounter (Signed)
Ok refill of meloxicam 15 mg daily.  Dr Blenda Mounts

## 2013-08-09 ENCOUNTER — Other Ambulatory Visit: Payer: Self-pay

## 2013-08-09 MED ORDER — RIZATRIPTAN BENZOATE 10 MG PO TABS: 10.0000 mg | ORAL_TABLET | ORAL | Status: DC | PRN

## 2013-08-09 NOTE — Telephone Encounter (Signed)
Pt request refill maxalt to [facility] employee pharmacy and pt wanted to ck on status of amitriptyline. Spoke with Reece Levy at [place] and Amitriptyline is ready for pick up.pt voiced understanding.

## 2013-08-15 ENCOUNTER — Telehealth: Payer: Self-pay | Admitting: *Deleted

## 2013-08-15 NOTE — Telephone Encounter (Signed)
vm left by pt stating that she faxed over paperwork to be completed for her to be able to workout at [facility]. i spoke to pt and informed her that I had not yet received paperwork. She states that she faxed it to the main line; requested that the fax it directly to me at [phone]. Fax has been received and placed on Dr Hulen Shouts desk for completion. Pt informed Dr Deborra Medina out of office until 08/17/13.

## 2013-08-17 ENCOUNTER — Ambulatory Visit (INDEPENDENT_AMBULATORY_CARE_PROVIDER_SITE_OTHER): Payer: 59

## 2013-08-17 VITALS — BP 167/105 | HR 97 | Resp 16

## 2013-08-17 DIAGNOSIS — M79609 Pain in unspecified limb: Secondary | ICD-10-CM

## 2013-08-17 DIAGNOSIS — G5760 Lesion of plantar nerve, unspecified lower limb: Secondary | ICD-10-CM

## 2013-08-17 NOTE — Patient Instructions (Signed)

## 2013-08-17 NOTE — Progress Notes (Signed)
   Subjective:    Patient ID: Mandy Torres, female    DOB: 10/17/1970, 43 y.o.   MRN: [id number]  HPI Comments: "I am wanting these shots. That's what helps"  patient had significant improvement following the steroid injection second interspace bilateral. However the pain has resumed there is tenderness both of second and third interspaces second more severe bilateral    Review of Systems no new changes or problems noted.     Objective:   Physical Exam Neurovascular status appears to be intact with pedal pulses palpable DP and PT +2/4 bilateral Refill time 3 seconds all digits turgor normal no edema noted neurologically epicritic and proprioceptive sensations intact there is paresthesia direct lateral compression second interspace and somewhat third interspace right slightly worse than left. Patient has otherwise normal muscle strengths no open wounds ulcerations mild flexible digital contractures noted otherwise unremarkable feet bilateral.       Assessment & Plan:  Assessment this time is suspect Morton's neuroma responding to the steroid injection this time patient is requesting more permanent correction to 4 months series of alcohol sclerosing injections. At this time infiltrated 1 cc of 4% alcohol and 0.5% Marcaine plain to each of the second and third interspaces of both feet. At this fact total of 4 cc was utilized. Patient are the injections well as instructed to take Tylenol as needed for pain and apply ice to the affected areas daily reappointed within the next week and then weekly thereafter for another 4 injections in a series of 5-7 injections are usually needed. Reevaluate in one to 2 weeks for her next injection for suspect Morton's neuroma maintain a coming shoes as instructed as well.  Harriet Masson DPM

## 2013-08-20 NOTE — Telephone Encounter (Signed)
Spoke to pt and informed her paperwork for workout clearance faxed to [place] at [facility] .

## 2013-08-24 ENCOUNTER — Ambulatory Visit (INDEPENDENT_AMBULATORY_CARE_PROVIDER_SITE_OTHER): Payer: 59

## 2013-08-24 VITALS — BP 149/98 | HR 96 | Resp 16

## 2013-08-24 DIAGNOSIS — M79609 Pain in unspecified limb: Secondary | ICD-10-CM

## 2013-08-24 DIAGNOSIS — G5760 Lesion of plantar nerve, unspecified lower limb: Secondary | ICD-10-CM

## 2013-08-24 NOTE — Progress Notes (Signed)
   Subjective:    Patient ID: Mandy Torres, female    DOB: 1970/11/06, 43 y.o.   MRN: [id number]  HPI Comments: "Well, the shots last week didn't do as good as the cortisone, but it is a little better"     Review of Systems no new changes or findings at this time     Objective:   Physical Exam Look straight basket status is intact pedal pulses palpable there's no signs of bruising or ecchymosis from previous injection patient has was temporary improvement although not as much as a steroid injection and the patient was explained has normal is expected that the improvement should increase over the series of injections. At this time and he still pain on direct lateral compression second intermetatarsal spaces of both feet. No new changes or findings noted       Assessment & Plan:  Assessment Morton's neuroma second and third interspaces bilateral at this time all 4 interspaces are infiltrated each with 1 cc of 4% alcohol solution and 0.5% Marcaine plain patient of injections well instructed to maintain ice packs the next couple days Tylenol or improve is needed for pain reappointed in the next week to 2 weeks for the third series of 5-7 injections. Contact of any difficulties or exacerbations occur  Harriet Masson DPM

## 2013-08-24 NOTE — Patient Instructions (Signed)

## 2013-09-04 ENCOUNTER — Ambulatory Visit: Payer: 59

## 2013-09-04 ENCOUNTER — Ambulatory Visit (INDEPENDENT_AMBULATORY_CARE_PROVIDER_SITE_OTHER): Payer: 59

## 2013-09-04 VITALS — BP 134/86 | HR 90 | Resp 12

## 2013-09-04 DIAGNOSIS — G5760 Lesion of plantar nerve, unspecified lower limb: Secondary | ICD-10-CM

## 2013-09-04 DIAGNOSIS — M79609 Pain in unspecified limb: Secondary | ICD-10-CM

## 2013-09-04 NOTE — Progress Notes (Signed)
   Subjective:    Patient ID: Mandy Torres, female    DOB: 1971/04/20, 43 y.o.   MRN: [id number]  HPI '' BOTH FEET MUCH BETTER, BUT STILL HAVING PAIN.''    Review of Systems no new changes or findings at this time.     Objective:   Physical Exam Neurovascular status is intact pedal pulses palpable distal slight tenderness of the second and third interspace second more so than third bilateral on direct lateral compression. No ecchymosis is noted no change in skin texture turgor no other new findings patient describes roughly less pain since last injections will continue with a series of 57 injections this time plan for her third injection to each of the second third interspaces bilateral.       Assessment & Plan:  Assessment persistent Morton's neuroma second third and metatarsal spaces of both feet. This time injection 1 cc of 4% alcohol solution and 0.5% Marcaine plain for you to each of the second and third intermetatarsal spaces bilateral feet. Reappointed one week for a fourth and a series of 57 injections maintain ice to the affected area and Tylenol for pain  Harriet Masson DPM

## 2013-09-04 NOTE — Patient Instructions (Signed)

## 2013-09-07 ENCOUNTER — Ambulatory Visit: Payer: 59 | Admitting: Neurology

## 2013-09-14 ENCOUNTER — Ambulatory Visit (INDEPENDENT_AMBULATORY_CARE_PROVIDER_SITE_OTHER): Payer: 59

## 2013-09-14 ENCOUNTER — Ambulatory Visit: Payer: Self-pay | Admitting: Family Medicine

## 2013-09-14 VITALS — BP 139/88 | HR 96 | Resp 12

## 2013-09-14 DIAGNOSIS — M79609 Pain in unspecified limb: Secondary | ICD-10-CM

## 2013-09-14 DIAGNOSIS — G5760 Lesion of plantar nerve, unspecified lower limb: Secondary | ICD-10-CM

## 2013-09-14 NOTE — Patient Instructions (Signed)

## 2013-09-14 NOTE — Progress Notes (Signed)
   Subjective:    Patient ID: Heath Lark, female    DOB: 06/23/1971, 43 y.o.   MRN: [id number]  HPI '' BOTH FEET ARE DOING OK AND IS ABOUT THE SAME.''    Review of Systems no new changes or findings    Objective:   Physical Exam Neurovascular status intact patient had decreased burning sensation up in the ball of the foot and the toe area still a bruised feeling your achiness a more proximal posse secondary to ecchymosis from the series of alcohol injections at this time a fourth injection of 1 cc 4% alcohol solution and 0.5% Marcaine plain infiltrated each of the second and third interspaces of both feet. There is no significant ecchymosis noted no increased temperature patient's symptoms are stabilizing are slightly improved will continue in reappointed one week for a fifth injection series       Assessment & Plan:  Assessment Morton's neuroma improving with steroid and correction alcohol injections has had 3 in a series of 57 injections at this time and fourth injection was delivered to each of the second and third interspaces bilateral. Maintain ice and Tylenol as needed for pain recheck in one week for a fifth injection and then we'll consider one-month hiatus  Harriet Masson DPM

## 2013-09-20 ENCOUNTER — Ambulatory Visit: Payer: 59 | Admitting: Neurology

## 2013-09-21 ENCOUNTER — Ambulatory Visit (INDEPENDENT_AMBULATORY_CARE_PROVIDER_SITE_OTHER): Payer: 59

## 2013-09-21 ENCOUNTER — Ambulatory Visit: Payer: 59

## 2013-09-21 ENCOUNTER — Ambulatory Visit: Payer: 59 | Admitting: Neurology

## 2013-09-21 VITALS — BP 142/89 | HR 93 | Resp 16

## 2013-09-21 DIAGNOSIS — M79609 Pain in unspecified limb: Secondary | ICD-10-CM

## 2013-09-21 DIAGNOSIS — G5760 Lesion of plantar nerve, unspecified lower limb: Secondary | ICD-10-CM

## 2013-09-21 NOTE — Progress Notes (Signed)
   Subjective:    Patient ID: Mandy Torres, female    DOB: 03-22-1971, 43 y.o.   MRN: [id number]  HPI Comments: "They are much better"  2nd and 3rd interspace bilateral     Review of Systems no new changes or finding     Objective:   Physical Exam Neurovascular status is intact pedal pulses palpable there is definite decrease the amount of pain or sensitivity on direct lateral compression second and third spaces of both feet [name] been feeling much better not completely resolved and improved at this time patient is ready for a fifth injection of alcohol to each of the second interspaces bilateral.       Assessment & Plan:  Assessment persistent Morton's neuroma improving with a series of alcohol injections at this time 1 cc of 4% alcohol is infiltrated to the second and third intermetatarsal spaces of both feet. The alcohol is infiltrated alcohol and 0.5% Marcaine plain is delivered patient tolerated the injection well we'll recheck in 6 weeks for long-term followup possible sixth or seventh injections if needed based on progress or improvement  Harriet Masson DPM

## 2013-09-21 NOTE — Patient Instructions (Signed)

## 2013-10-05 ENCOUNTER — Telehealth: Payer: Self-pay | Admitting: *Deleted

## 2013-10-05 ENCOUNTER — Ambulatory Visit (INDEPENDENT_AMBULATORY_CARE_PROVIDER_SITE_OTHER): Payer: 59 | Admitting: Neurology

## 2013-10-05 ENCOUNTER — Encounter: Payer: Self-pay | Admitting: Neurology

## 2013-10-05 VITALS — BP 130/84 | HR 84 | Temp 97.2°F | Resp 20 | Ht 64.0 in | Wt 181.8 lb

## 2013-10-05 DIAGNOSIS — M542 Cervicalgia: Secondary | ICD-10-CM

## 2013-10-05 DIAGNOSIS — M5481 Occipital neuralgia: Secondary | ICD-10-CM

## 2013-10-05 DIAGNOSIS — G43009 Migraine without aura, not intractable, without status migrainosus: Secondary | ICD-10-CM

## 2013-10-05 DIAGNOSIS — G4440 Drug-induced headache, not elsewhere classified, not intractable: Secondary | ICD-10-CM

## 2013-10-05 DIAGNOSIS — M531 Cervicobrachial syndrome: Secondary | ICD-10-CM

## 2013-10-05 MED ORDER — NORTRIPTYLINE HCL 10 MG PO CAPS: 10.0000 mg | ORAL_CAPSULE | Freq: Every day | ORAL | Status: DC

## 2013-10-05 NOTE — Patient Instructions (Signed)
Migraine Recommendations: 1.  Take Maxalt (10mg ) at immediate onset of migraine. 2.  Limit use of pain relievers to no more than 2 days out of the week.  These medications include acetaminophen, ibuprofen, triptans and narcotics.  This will help reduce risk of rebound headaches. 3.  Keep a headache diary. 4.  Stay adequately hydrated. 5.  Maintain good sleep hygiene. 6.  Maintain proper stress management. 7.  We will stop amitriptyline and instead start nortriptyline 10mg  at bedtime.  It is similar to amitriptyline except it has a reputation of less side effects.  Call in 4 weeks with update. 8.  We will schedule you for occipital nerve blocks. 9.  Follow up in 3 months.
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
## 2013-10-05 NOTE — Telephone Encounter (Signed)
Pharmacy at [phone] is aware of medication change

## 2013-10-10 ENCOUNTER — Ambulatory Visit: Payer: Self-pay | Admitting: Family Medicine

## 2013-10-19 ENCOUNTER — Ambulatory Visit (INDEPENDENT_AMBULATORY_CARE_PROVIDER_SITE_OTHER): Payer: 59 | Admitting: Neurology

## 2013-10-19 VITALS — BP 140/90 | HR 78 | Temp 98.0°F | Resp 18 | Wt 180.8 lb

## 2013-10-19 DIAGNOSIS — M531 Cervicobrachial syndrome: Secondary | ICD-10-CM

## 2013-10-19 DIAGNOSIS — M5481 Occipital neuralgia: Secondary | ICD-10-CM

## 2013-10-19 MED ORDER — BUPIVACAINE HCL 0.25 % IJ SOLN
3.0000 mL | Freq: Once | INTRAMUSCULAR | Status: AC
  Administered 2013-10-19: 3 mL

## 2013-10-19 MED ORDER — TRIAMCINOLONE ACETONIDE 40 MG/ML IJ SUSP
40.0000 mg | Freq: Once | INTRAMUSCULAR | Status: AC
  Administered 2013-10-19: 40 mg via INTRAMUSCULAR

## 2013-10-19 NOTE — Progress Notes (Signed)
See procedure note.

## 2013-10-19 NOTE — Procedures (Signed)
Procedure: Occipital nerve block  Procedure risks, benefits and alternative treatments explained to patient and they agree to proceed.   A concentration of 0.25% bupivacaine (3 ml) was mixed with 40 mg of Kenalog (1 ml). A 30 gauge needle was used for injection. The regions of the left and right greater occipital nerves were located by palpation. The areas were prepped with alcohol. A total of 4 cc of the above mixture (2 cc each side) was injected without difficulty. The patient tolerated the procedure well.    Adam R. Tomi Likens, DO

## 2013-10-31 ENCOUNTER — Other Ambulatory Visit: Payer: Self-pay | Admitting: Neurology

## 2013-10-31 ENCOUNTER — Other Ambulatory Visit: Payer: Self-pay | Admitting: *Deleted

## 2013-10-31 MED ORDER — METHOCARBAMOL 750 MG PO TABS: 750.0000 mg | ORAL_TABLET | Freq: Four times a day (QID) | ORAL | Status: DC

## 2013-10-31 NOTE — Telephone Encounter (Signed)
Pt left v/m requesting refill methocarbamol to [facility]; pt said had discussed with Dr Deborra Medina about filling methocarbamol; Mendel Ryder PA had previously filled.

## 2013-10-31 NOTE — Telephone Encounter (Signed)
Pt requesting medication refill. Last ov 07/2013-acute with no future appts scheduled. pls advise

## 2013-11-02 ENCOUNTER — Ambulatory Visit (INDEPENDENT_AMBULATORY_CARE_PROVIDER_SITE_OTHER): Payer: 59

## 2013-11-02 VITALS — BP 158/93 | HR 18

## 2013-11-02 DIAGNOSIS — M779 Enthesopathy, unspecified: Secondary | ICD-10-CM

## 2013-11-02 DIAGNOSIS — G5760 Lesion of plantar nerve, unspecified lower limb: Secondary | ICD-10-CM

## 2013-11-02 DIAGNOSIS — M79609 Pain in unspecified limb: Secondary | ICD-10-CM

## 2013-11-02 DIAGNOSIS — M722 Plantar fascial fibromatosis: Secondary | ICD-10-CM

## 2013-11-02 NOTE — Progress Notes (Signed)
   Subjective:    Patient ID: Mandy Torres, female    DOB: [birth year], 43 y.o.   MRN: [id number]  HPI  Both feet are better and my left feet is an issue and when I walk it has pain to it    Review of Systems no new systemic findings or changes noted     Objective:   Physical Exam Lower extremity objective findings as follows neurovascular status is intact there is no reproducible pain on direct lateral compression of the interspaces bilateral patient has series of 5 alcohol sclerosing injections some slight tenderness when she tries to hyperextend or Hyperflex the toes on the left foot: Nearly as severe or problematic as it had been constantly prior to that and series of injections been completed I did not feel additional injections or needed at this time for patient continues to have some mild digital contracture and some mild capsulitis a second MTP joint with hyperextension also posse some mild plantar fascial symptomology with gait changes her old orthotics are more than 45 years old and likely no longer functioning patient would benefit from new functional orthoses this time to maintain the correction improvement we've achieved an alcohol injections and prevent recurrence of both/plantar fasciitis capsulitis of the foot as well as neuroma symptomology      Assessment & Plan:  Assessment resolved are improved neuroma symptomology bilateral feet residual capsulitis and plantar fascial symptomology the arch and foot are still present would benefit from functional orthoses orthotics skin carried out at this time patient be contacted with in 2-3 weeks for orthotic pickup and fitting once they're ready available for fitting and use with appropriate instructions for use been given. Recheck in 3 weeks for followup. If there is any future re\re exacerbation of neuroma symptomology consider posterior injections of alcohol that time Harriet Masson DPM

## 2013-11-02 NOTE — Patient Instructions (Signed)
WEARING INSTRUCTIONS FOR ORTHOTICS  Don't expect to be comfortable wearing your orthotic devices for the first time.  Like eyeglasses, you may be aware of them as time passes, they will not be uncomfortable and you will enjoy wearing them.  FOLLOW THESE INSTRUCTIONS EXACTLY!  1. Wear your orthotic devices for:       Not more than 1 hour the first day.       Not more than 2 hours the second day.       Not more than 3 hours the third day and so on.        Or wear them for as long as they feel comfortable.       If you experience discomfort in your feet or legs take them out.  When feet & legs feel       better, put them back in.  You do need to be consistent and wear them a little        everyday. 2.   If at any time the orthotic devices become acutely uncomfortable before the       time for that particular day, STOP WEARING THEM. 3.   On the next day, do not increase the wearing time. 4.   Subsequently, increase the wearing time by 15-30 minutes only if comfortable to do       so. 5.   You will be seen by your doctor about 2-4 weeks after you receive your orthotic       devices, at which time you will probably be wearing your devices comfortably        for about 8 hours or more a day. 6.   Some patients occasionally report mild aches or discomfort in other parts of the of       body such as the knees, hips or back after 3 or 4 consecutive hours of wear.  If this       is the case with you, do not extend your wearing time.  Instead, cut it back an hour or       two.  In all likelihood, these symptoms will disappear in a short period of time as your       body posture realigns itself and functions more efficiently. 7.   It is possible that your orthotic device may require some small changes or adjustment       to improve their function or make them more comfortable.   This is usually not done       before one to three months have elapsed.  These adjustments are made in        accordance  with the changed position your feet are assuming as a result of       improved biomechanical function. 8.   In women's shoes, it's not unusual for your heel to slip out of the shoe, particularly if       they are step-in-shoes.  If this is the case, try other shoes or other styles.  Try to       purchase shoes which have deeper heal seats or higher heel counters. 9.   Squeaking of orthotics devices in the shoes is due to the movement of the devices       when they are functioning normally.  To eliminate squeaking, simply dust some       baby powder into your shoes before inserting the devices.  If this does not work,          apply soap or wax to the edges of the orthotic devices or put a tissue into the shoes. 10. It is important that you follow these directions explicitly.  Failure to do so will simply       prolong the adjustment period or create problems which are easily avoided.  It makes       no difference if you are wearing your orthotic devices for only a few hours after        several months, so long as you are wearing them comfortably for those hours. 11. If you have any questions or complaints, contact our office.  We have no way of       knowing about your problems unless you tell us.  If we do not hear from you, we will       assume that you are proceeding well.     ICE INSTRUCTIONS  Apply ice or cold pack to the affected area at least 3 times a day for 10-15 minutes each time.  You should also use ice after prolonged activity or vigorous exercise.  Do not apply ice longer than 20 minutes at one time.  Always keep a cloth between your skin and the ice pack to prevent burns.  Being consistent and following these instructions will help control your symptoms.  We suggest you purchase a gel ice pack because they are reusable and do bit leak.  Some of them are designed to wrap around the area.  Use the method that works best for you.  Here are some other suggestions for icing.   Use a  frozen bag of peas or corn-inexpensive and molds well to your body, usually stays frozen for 10 to 20 minutes.  Wet a towel with cold water and squeeze out the excess until it's damp.  Place in a bag in the freezer for 20 minutes. Then remove and use. 

## 2013-11-15 ENCOUNTER — Other Ambulatory Visit: Payer: Self-pay | Admitting: *Deleted

## 2013-11-15 MED ORDER — NABUMETONE 500 MG PO TABS: 500.0000 mg | ORAL_TABLET | Freq: Two times a day (BID) | ORAL | Status: DC

## 2013-11-15 NOTE — Telephone Encounter (Signed)
Per Dr. Deborra Medina, Rx was faxed to [facility] and chart updated with med and pt notified Rx was sent

## 2013-11-30 ENCOUNTER — Ambulatory Visit (INDEPENDENT_AMBULATORY_CARE_PROVIDER_SITE_OTHER): Payer: 59 | Admitting: *Deleted

## 2013-11-30 DIAGNOSIS — M722 Plantar fascial fibromatosis: Secondary | ICD-10-CM

## 2013-11-30 NOTE — Patient Instructions (Signed)

## 2013-11-30 NOTE — Progress Notes (Signed)
Dispensed patient's orthotics with oral and written instructions for wearing. Patient will follow up with Dr. Sikora in 6 weeks for an orthotic check.  

## 2013-12-04 ENCOUNTER — Other Ambulatory Visit: Payer: Self-pay | Admitting: Neurology

## 2013-12-21 ENCOUNTER — Ambulatory Visit: Payer: 59 | Admitting: Neurology

## 2013-12-28 ENCOUNTER — Telehealth: Payer: Self-pay | Admitting: Family Medicine

## 2013-12-28 NOTE — Telephone Encounter (Signed)
Patient Information:  Caller Name: Kathaleen  Phone: [phone]  Patient: Mandy Torres, Mandy Torres  Gender: Female  DOB: [birth year]  Age: 43 Years  PCP: Arnette Norris [facility])  Pregnant: No  Office Follow Up:  Does the office need to follow up with this patient?: No  Instructions For The Office: N/A  RN Note:  Triaged per Nausea Guideline in [facility]; Homecare due to all other situations all triage questions negative; force fluids and make sure continuing to urinate well; if sx increase or worsen call back   Symptoms  Reason For Call & Symptoms: Pt is calling and states that she has had diarrhea since 12/25/13; she is also having nausea and feeling hot and cold;  no further diarrhea and no vomiting; still feeling very nauseated; had several bottles of Gatorade;   no fever; urinated 20 minutes ago and good amount; states she feels better today than she had been feeling  Reviewed Health History In EMR: Yes  Reviewed Medications In EMR: Yes  Reviewed Allergies In EMR: Yes  Reviewed Surgeries / Procedures: Yes  Date of Onset of Symptoms: 12/25/2013 OB / GYN:  LMP: Unknown  Guideline(s) Used:  No Protocol Available - Sick Adult  Disposition Per Guideline:   Home Care  Reason For Disposition Reached:   Patient's symptoms are safe to treat at home per nursing judgment  Advice Given:  Call Back If:  New symptoms develop  You become worse.  Patient Will Follow Care Advice:  YES

## 2013-12-30 NOTE — Telephone Encounter (Signed)
Noted  

## 2014-01-18 ENCOUNTER — Ambulatory Visit (INDEPENDENT_AMBULATORY_CARE_PROVIDER_SITE_OTHER): Payer: 59

## 2014-01-18 VITALS — BP 141/82 | HR 95 | Resp 16

## 2014-01-18 DIAGNOSIS — M948X9 Other specified disorders of cartilage, unspecified sites: Secondary | ICD-10-CM

## 2014-01-18 DIAGNOSIS — M722 Plantar fascial fibromatosis: Secondary | ICD-10-CM

## 2014-01-18 DIAGNOSIS — M779 Enthesopathy, unspecified: Secondary | ICD-10-CM

## 2014-01-18 DIAGNOSIS — G5760 Lesion of plantar nerve, unspecified lower limb: Secondary | ICD-10-CM

## 2014-01-18 DIAGNOSIS — M2580 Other specified joint disorders, unspecified joint: Secondary | ICD-10-CM

## 2014-01-18 DIAGNOSIS — M79609 Pain in unspecified limb: Secondary | ICD-10-CM

## 2014-01-18 DIAGNOSIS — M79606 Pain in leg, unspecified: Secondary | ICD-10-CM

## 2014-01-18 MED ORDER — MELOXICAM 15 MG PO TABS: 15.0000 mg | ORAL_TABLET | Freq: Every day | ORAL | Status: DC

## 2014-01-18 NOTE — Progress Notes (Signed)
   Subjective:    Patient ID: Mandy Torres, female    DOB: Sep 29, 1970, 43 y.o.   MRN: [id number]  HPI Comments: Orthotic Check  "I feel like these sharp knife like pains"       Review of Systems no new systemic changes or findings noted     Objective:   Physical Exam Neurovascular status is intact and unchanged pedal pulses are palpable patient is having pain under the first MTP area ball of left foot more so than right no neuroma pain in the interspaces are doing much better no plantar fascial pain the orthotic otherwise fit and contour well however has possibly some changes in her gait and conscious he feels his walking little different has developed possibly a mild sesamoiditis or bursitis of the first MTP joint left x-rays demonstrate no fracture slight HAV deformity bilateral with subluxation at the MTP joint bipartite tibial sesamoid on the right no bipartite sesamoid on the left although cannot rule out a arthropathy or shifting of sesamoids.       Assessment & Plan:  Assessment sesamoiditis or bursitis first MTP area left foot at this time a dancer's pad or pockets applied to the left orthotic with adjustment also this time patient placed back on a regimen of MOBIC 15 mg once daily and run out of her NSAID therapy in the past the [facility] have been helpful previously also recommend warm compress ice pack to the area recheck in 2-4 weeks if no improvement may be candidate for more invasive options possibly a steroid injection or additional adjustments to orthoses next  [employer] the

## 2014-01-18 NOTE — Patient Instructions (Signed)
ICE INSTRUCTIONS  Apply ice or cold pack to the affected area at least 3 times a day for 10-15 minutes each time.  You should also use ice after prolonged activity or vigorous exercise.  Do not apply ice longer than 20 minutes at one time.  Always keep a cloth between your skin and the ice pack to prevent burns.  Being consistent and following these instructions will help control your symptoms.  We suggest you purchase a gel ice pack because they are reusable and do bit leak.  Some of them are designed to wrap around the area.  Use the method that works best for you.  Here are some other suggestions for icing.   Use a frozen bag of peas or corn-inexpensive and molds well to your body, usually stays frozen for 10 to 20 minutes.  Wet a towel with cold water and squeeze out the excess until it's damp.  Place in a bag in the freezer for 20 minutes. Then remove and use.  Alternate applying warm compress ice pack to the ball of left foot in the area of sesamoid bone and bursitis apply ice 2 or 3 times daily as needed

## 2014-01-25 ENCOUNTER — Ambulatory Visit (INDEPENDENT_AMBULATORY_CARE_PROVIDER_SITE_OTHER): Payer: 59 | Admitting: Family Medicine

## 2014-01-25 ENCOUNTER — Encounter: Payer: Self-pay | Admitting: Family Medicine

## 2014-01-25 VITALS — BP 144/92 | HR 90 | Temp 98.0°F | Wt 180.0 lb

## 2014-01-25 DIAGNOSIS — F32A Depression, unspecified: Secondary | ICD-10-CM

## 2014-01-25 DIAGNOSIS — E7849 Other hyperlipidemia: Secondary | ICD-10-CM

## 2014-01-25 DIAGNOSIS — R030 Elevated blood-pressure reading, without diagnosis of hypertension: Secondary | ICD-10-CM

## 2014-01-25 DIAGNOSIS — F3289 Other specified depressive episodes: Secondary | ICD-10-CM

## 2014-01-25 DIAGNOSIS — E785 Hyperlipidemia, unspecified: Secondary | ICD-10-CM

## 2014-01-25 DIAGNOSIS — R7303 Prediabetes: Secondary | ICD-10-CM | POA: Insufficient documentation

## 2014-01-25 DIAGNOSIS — R946 Abnormal results of thyroid function studies: Secondary | ICD-10-CM

## 2014-01-25 DIAGNOSIS — E669 Obesity, unspecified: Secondary | ICD-10-CM

## 2014-01-25 DIAGNOSIS — R7989 Other specified abnormal findings of blood chemistry: Secondary | ICD-10-CM | POA: Insufficient documentation

## 2014-01-25 DIAGNOSIS — F329 Major depressive disorder, single episode, unspecified: Secondary | ICD-10-CM

## 2014-01-25 DIAGNOSIS — R7309 Other abnormal glucose: Secondary | ICD-10-CM

## 2014-01-25 LAB — COMPREHENSIVE METABOLIC PANEL
ALK PHOS: 79 U/L (ref 39–117)
ALT: 14 U/L (ref 0–35)
AST: 18 U/L (ref 0–37)
Albumin: 4.2 g/dL (ref 3.5–5.2)
BUN: 6 mg/dL (ref 6–23)
CO2: 28 mEq/L (ref 19–32)
CREATININE: 0.77 mg/dL (ref 0.50–1.10)
Calcium: 9.1 mg/dL (ref 8.4–10.5)
Chloride: 96 mEq/L (ref 96–112)
Glucose, Bld: 85 mg/dL (ref 70–99)
Potassium: 4.2 mEq/L (ref 3.5–5.3)
Sodium: 132 mEq/L — ABNORMAL LOW (ref 135–145)
Total Bilirubin: 0.2 mg/dL (ref 0.2–1.2)
Total Protein: 6.4 g/dL (ref 6.0–8.3)

## 2014-01-25 LAB — T4, FREE: Free T4: 0.85 ng/dL (ref 0.80–1.80)

## 2014-01-25 LAB — LDL CHOLESTEROL, DIRECT: LDL DIRECT: 137 mg/dL — AB

## 2014-01-25 LAB — HEMOGLOBIN A1C
HEMOGLOBIN A1C: 5.8 % — AB (ref <5.7)
MEAN PLASMA GLUCOSE: 120 mg/dL — AB (ref <117)

## 2014-01-25 LAB — TSH: TSH: 4.302 u[IU]/mL (ref 0.350–4.500)

## 2014-01-25 MED ORDER — METHOCARBAMOL 750 MG PO TABS: 750.0000 mg | ORAL_TABLET | Freq: Four times a day (QID) | ORAL | Status: DC

## 2014-01-25 MED ORDER — RIZATRIPTAN BENZOATE 10 MG PO TABS: 10.0000 mg | ORAL_TABLET | ORAL | Status: DC | PRN

## 2014-01-25 MED ORDER — HYDROCHLOROTHIAZIDE 12.5 MG PO CAPS: 12.5000 mg | ORAL_CAPSULE | Freq: Every day | ORAL | Status: DC

## 2014-01-25 NOTE — Assessment & Plan Note (Signed)
New.  Advised that Mobic could worsen LE edema/BP. She will monitor this. Start HCTZ 12.5 mg daily. Follow up in 2 weeks.

## 2014-01-25 NOTE — Assessment & Plan Note (Signed)
Has not lost weight but starting a new exercise program. Very motivated.

## 2014-01-25 NOTE — Patient Instructions (Addendum)
Great to see you. We will call you with your lab results and you can view them online.  We are staring hydrochlorothiazide 12. 5 mg daily.  Follow up in 2 weeks.

## 2014-01-25 NOTE — Assessment & Plan Note (Signed)
Recheck ldl today.

## 2014-01-25 NOTE — Assessment & Plan Note (Signed)
Recheck a1c today.

## 2014-01-25 NOTE — Progress Notes (Signed)
Subjective:   Patient ID: Mandy Torres, female    DOB: 11-17-1970, 43 y.o.   MRN: [id number]  Mandy Torres is a pleasant 43 y.o. year old female who presents to clinic today with Hypertension  on 01/25/2014  HPI: ?HTN/LE edema-  BP has been going up for about a year. Does feel "beet red" when she exercises.  No CP or SOB.  Does feel legs are more swollen at the end of the day. BP Readings from Last 3 Encounters:  01/25/14 144/92  01/18/14 141/82  11/02/13 158/93     Abnormal thyroid labs- In January 2015, TSH normal but Ft4 a little low at 0.74. Denies any symptoms of hypothyroidism other than difficulty losing weight. Wt Readings from Last 3 Encounters:  01/25/14 180 lb (81.647 kg)  10/19/13 180 lb 12.8 oz (82.01 kg)  10/05/13 181 lb 12.8 oz (82.464 kg)   Has not been able to exercise as much due to foot pain but this is improving.    H/o prediabetes- Lab Results  Component Value Date   HGBA1C 5.8* 08/03/2013    Foot/leg pain- saw Dr. Blenda Mounts, podiatry, on 7/10- note reviewed and recommendations as follows:  sesamoiditis or bursitis first MTP area left foot at this time a dancer's pad or pockets applied to the left orthotic with adjustment also this time patient placed back on a regimen of MOBIC 15 mg once daily and run out of her NSAID therapy in the past the [facility] have been helpful previously also recommend warm compress ice pack to the area recheck in 2-4 weeks if no improvement may be candidate for more invasive options possibly a steroid injection or additional adjustments to orthoses next   Occipital neuralgia- followed by Dr. Tomi Likens.  Had occipital nerve block on 10/19/13.  Note reviewed.    Depression- followed by Dennard Nip, NP.  Current Outpatient Prescriptions on File Prior to Visit  Medication Sig Dispense Refill  . Ascorbic Acid (VITAMIN C) 1000 MG tablet Take 1,000 mg by mouth daily.      . cetirizine (ZYRTEC) 10 MG tablet Take 10 mg by mouth daily.       Marland Kitchen loratadine (CLARITIN) 10 MG tablet Take 10 mg by mouth daily.      . meloxicam (MOBIC) 15 MG tablet Take 1 tablet (15 mg total) by mouth daily.  30 tablet  2  . norethindrone-ethinyl estradiol (MICROGESTIN,JUNEL,LOESTRIN) 1-20 MG-MCG tablet Take 1 tablet by mouth daily.        . nortriptyline (PAMELOR) 10 MG capsule TAKE 1 CAPSULE (10 MG TOTAL) BY MOUTH AT BEDTIME.  30 capsule  0  . Omeprazole-Sodium Bicarbonate (ZEGERID) 20-1100 MG CAPS Take 1 capsule by mouth daily.        Marland Kitchen venlafaxine (EFFEXOR-XR) 75 MG 24 hr capsule Take 75 mg by mouth 3 (three) times daily.         Current Facility-Administered Medications on File Prior to Visit  Medication Dose Route Frequency Provider Last Rate Last Dose  . triamcinolone acetonide (KENALOG) 10 MG/ML injection 10 mg  10 mg Intramuscular Once Harriet Masson, DPM      . triamcinolone acetonide (KENALOG) 10 MG/ML injection 10 mg  10 mg Intramuscular Once Harriet Masson, DPM        Allergies  Allergen Reactions  . Avelox [Moxifloxacin Hcl In Nacl] Hives  . Endocet [Oxycodone-Acetaminophen]   . Levofloxacin Nausea And Vomiting  . Penicillins Hives  . Percocet [Oxycodone-Acetaminophen] Nausea And Vomiting    Also Endocet  .  Ranitidine Nausea And Vomiting  . Sulfa Antibiotics Hives  . Topamax     congnitive impairmant  . Valium     Hyperactivity     Past Medical History  Diagnosis Date  . History of breast biopsy 05/2001  . Allergy   . ADHD (attention deficit hyperactivity disorder)     [name]  . Depression   . DJD (degenerative joint disease), cervical     neurosurg - Dr. Wendall Stade at [facility]  . Migraine   . Hiatal hernia   . Foot neuroma   . TMJ disease     Past Surgical History  Procedure Laterality Date  . Cholecystectomy    . Knee surgery  11/97 and 4/93    right  . Oophorectomy    . Appendectomy    . Nasal sinus surgery  2006 and 1998    deviated septum, ethmoids, Dr. Caryn Section then Richardson Landry  . Facial reconstruction  surgery  2/88, 9/88, 2/89  . Breast surgery      normal biopsy 2002, left    Family History  Problem Relation Age of Onset  . Cancer Mother     Breast  . Cancer Sister     Half sister, Breast  . Cancer Maternal Aunt     Breast    History   Social History  . Marital Status: Single    Spouse Name: N/A    Number of Children: N/A  . Years of Education: N/A   Occupational History  . Not on file.   Social History Main Topics  . Smoking status: Never Smoker   . Smokeless tobacco: Never Used  . Alcohol Use: Yes     Comment: social  . Drug Use: No  . Sexual Activity: Yes   Other Topics Concern  . Not on file   Social History Narrative  . No narrative on file   The PMH, PSH, Social History, Family History, Medications, and allergies have been reviewed in [facility], and have been updated if relevant.  Review of Systems    See HPI Denies any anxiety or depression  Objective:    BP 144/92  Pulse 90  Temp(Src) 98 F (36.7 C) (Oral)  Wt 180 lb (81.647 kg)  SpO2 97%   Physical Exam  Nursing note and vitals reviewed. Constitutional: She appears well-developed and well-nourished. No distress.  HENT:  Head: Normocephalic.  Neck: Normal range of motion.  Cardiovascular: Normal rate and regular rhythm.   Pulmonary/Chest: Effort normal and breath sounds normal. No respiratory distress. She has no wheezes. She has no rales.  Musculoskeletal: She exhibits edema.  Trace edema bilaterally  Psychiatric: She has a normal mood and affect. Her behavior is normal. Judgment and thought content normal.          Assessment & Plan:   Prediabetes - Plan: Hemoglobin A1c  Abnormal thyroid blood test - Plan: TSH, T4, Free  HLD (hyperlipidemia) - Plan: LDL Cholesterol, Direct, Comprehensive metabolic panel  Hyperlipidemia, familial, high LDL  Elevated blood pressure reading without diagnosis of hypertension  Depression  Obesity, unspecified No Follow-up on file.

## 2014-01-25 NOTE — Progress Notes (Signed)
Pre visit review using our clinic review tool, if applicable. No additional management support is needed unless otherwise documented below in the visit note. 

## 2014-01-28 ENCOUNTER — Other Ambulatory Visit: Payer: Self-pay | Admitting: Neurology

## 2014-01-28 NOTE — Telephone Encounter (Signed)
Okay to refill nortriptyline 10mg  at bedtime, #30, R3

## 2014-01-28 NOTE — Telephone Encounter (Signed)
Please advise 

## 2014-01-31 ENCOUNTER — Telehealth: Payer: Self-pay

## 2014-01-31 NOTE — Telephone Encounter (Signed)
Pt called to ck on recent lab results advised Dr Deborra Medina plans to discuss at 02/07/14 appt and pt said that was OK.

## 2014-02-07 ENCOUNTER — Ambulatory Visit: Payer: 59 | Admitting: Family Medicine

## 2014-02-15 ENCOUNTER — Ambulatory Visit (INDEPENDENT_AMBULATORY_CARE_PROVIDER_SITE_OTHER): Payer: 59 | Admitting: Family Medicine

## 2014-02-15 ENCOUNTER — Ambulatory Visit (INDEPENDENT_AMBULATORY_CARE_PROVIDER_SITE_OTHER): Payer: 59

## 2014-02-15 ENCOUNTER — Encounter: Payer: Self-pay | Admitting: Family Medicine

## 2014-02-15 ENCOUNTER — Ambulatory Visit: Payer: 59

## 2014-02-15 VITALS — BP 132/84 | HR 85 | Resp 16

## 2014-02-15 VITALS — BP 132/84 | HR 85 | Temp 98.3°F | Wt 182.5 lb

## 2014-02-15 DIAGNOSIS — M79606 Pain in leg, unspecified: Secondary | ICD-10-CM

## 2014-02-15 DIAGNOSIS — R7309 Other abnormal glucose: Secondary | ICD-10-CM

## 2014-02-15 DIAGNOSIS — G5760 Lesion of plantar nerve, unspecified lower limb: Secondary | ICD-10-CM

## 2014-02-15 DIAGNOSIS — I10 Essential (primary) hypertension: Secondary | ICD-10-CM

## 2014-02-15 DIAGNOSIS — R7989 Other specified abnormal findings of blood chemistry: Secondary | ICD-10-CM

## 2014-02-15 DIAGNOSIS — R946 Abnormal results of thyroid function studies: Secondary | ICD-10-CM

## 2014-02-15 DIAGNOSIS — M948X9 Other specified disorders of cartilage, unspecified sites: Secondary | ICD-10-CM

## 2014-02-15 DIAGNOSIS — M2580 Other specified joint disorders, unspecified joint: Secondary | ICD-10-CM

## 2014-02-15 DIAGNOSIS — R7303 Prediabetes: Secondary | ICD-10-CM

## 2014-02-15 DIAGNOSIS — M79609 Pain in unspecified limb: Secondary | ICD-10-CM

## 2014-02-15 DIAGNOSIS — E669 Obesity, unspecified: Secondary | ICD-10-CM

## 2014-02-15 DIAGNOSIS — M722 Plantar fascial fibromatosis: Secondary | ICD-10-CM

## 2014-02-15 MED ORDER — HYDROCHLOROTHIAZIDE 12.5 MG PO CAPS: 12.5000 mg | ORAL_CAPSULE | Freq: Every day | ORAL | Status: DC

## 2014-02-15 MED ORDER — TRIAMCINOLONE ACETONIDE 10 MG/ML IJ SUSP: 10.0000 mg | Freq: Once | INTRAMUSCULAR | Status: DC

## 2014-02-15 NOTE — Progress Notes (Signed)
Pre visit review using our clinic review tool, if applicable. No additional management support is needed unless otherwise documented below in the visit note. 

## 2014-02-15 NOTE — Progress Notes (Signed)
Subjective:   Patient ID: Mandy Torres, female    DOB: 1971/01/08, 43 y.o.   MRN: [id number]  Mandy Torres is a pleasant 43 y.o. year old female who presents to clinic today with Follow-up  on 02/15/2014  HPI: Two week follow up:  Saw her on 7/17-  HTN/LE edema-  BP had been going up for about a year. Did endorse feeling "beet red" when she exercises.  No CP or SOB. Also noticed more LE edema at the end of the day. Started HCTZ 12.5 mg daily. Has noticed improved in lower extremity edema. No side effects.  Does feel like her BP has been under better control.  Lab Results  Component Value Date   CREATININE 0.77 01/25/2014   Wt Readings from Last 3 Encounters:  02/15/14 182 lb 8 oz (82.781 kg)  01/25/14 180 lb (81.647 kg)  10/19/13 180 lb 12.8 oz (82.01 kg)     Abnormal thyroid labs 6 months ago- In January 2015, TSH normal but Ft4 a little low at 0.74. Denied any symptoms of hypothyroidism other than difficulty losing weight. Labs done two weeks ago normal FT 4 0.85- we are discussing those today as well. Lab Results  Component Value Date   TSH 4.302 01/25/2014      H/o prediabetes- Lab Results  Component Value Date   HGBA1C 5.8* 01/25/2014   HLD- LDL improved to 137 two weeks ago. Lab Results  Component Value Date   CHOL 207* 08/03/2013   HDL 46 08/03/2013   LDLCALC 120* 08/03/2013   LDLDIRECT 137* 01/25/2014   TRIG 203* 08/03/2013   CHOLHDL 4.5 08/03/2013   Current Outpatient Prescriptions on File Prior to Visit  Medication Sig Dispense Refill  . Ascorbic Acid (VITAMIN C) 1000 MG tablet Take 1,000 mg by mouth daily.      . cetirizine (ZYRTEC) 10 MG tablet Take 10 mg by mouth daily.      Marland Kitchen gabapentin (NEURONTIN) 100 MG capsule Take 100 mg by mouth 3 (three) times daily.      Marland Kitchen loratadine (CLARITIN) 10 MG tablet Take 10 mg by mouth daily.      . meloxicam (MOBIC) 15 MG tablet Take 1 tablet (15 mg total) by mouth daily.  30 tablet  2  . methocarbamol (ROBAXIN)  750 MG tablet Take 1 tablet (750 mg total) by mouth 4 (four) times daily.  120 tablet  0  . norethindrone-ethinyl estradiol (MICROGESTIN,JUNEL,LOESTRIN) 1-20 MG-MCG tablet Take 1 tablet by mouth daily.        . nortriptyline (PAMELOR) 10 MG capsule TAKE 1 CAPSULE (10 MG TOTAL) BY MOUTH AT BEDTIME.  30 capsule  3  . Omeprazole-Sodium Bicarbonate (ZEGERID) 20-1100 MG CAPS Take 1 capsule by mouth daily.        . rizatriptan (MAXALT) 10 MG tablet Take 1 tablet (10 mg total) by mouth as needed. May repeat in 2 hours if needed  9 tablet  3  . venlafaxine (EFFEXOR-XR) 75 MG 24 hr capsule Take 75 mg by mouth 3 (three) times daily.         Current Facility-Administered Medications on File Prior to Visit  Medication Dose Route Frequency Provider Last Rate Last Dose  . triamcinolone acetonide (KENALOG) 10 MG/ML injection 10 mg  10 mg Intramuscular Once Harriet Masson, DPM      . triamcinolone acetonide (KENALOG) 10 MG/ML injection 10 mg  10 mg Intramuscular Once Harriet Masson, DPM        Allergies  Allergen Reactions  .  Avelox [Moxifloxacin Hcl In Nacl] Hives  . Endocet [Oxycodone-Acetaminophen]   . Levofloxacin Nausea And Vomiting  . Penicillins Hives  . Percocet [Oxycodone-Acetaminophen] Nausea And Vomiting    Also Endocet  . Ranitidine Nausea And Vomiting  . Sulfa Antibiotics Hives  . Topamax     congnitive impairmant  . Valium     Hyperactivity     Past Medical History  Diagnosis Date  . History of breast biopsy 05/2001  . Allergy   . ADHD (attention deficit hyperactivity disorder)     [name]  . Depression   . DJD (degenerative joint disease), cervical     neurosurg - Dr. Wendall Stade at [facility]  . Migraine   . Hiatal hernia   . Foot neuroma   . TMJ disease     Past Surgical History  Procedure Laterality Date  . Cholecystectomy    . Knee surgery  11/97 and 4/93    right  . Oophorectomy    . Appendectomy    . Nasal sinus surgery  2006 and 1998    deviated septum, ethmoids,  Dr. Caryn Section then Richardson Landry  . Facial reconstruction surgery  2/88, 9/88, 2/89  . Breast surgery      normal biopsy 2002, left    Family History  Problem Relation Age of Onset  . Cancer Mother     Breast  . Cancer Sister     Half sister, Breast  . Cancer Maternal Aunt     Breast    History   Social History  . Marital Status: Single    Spouse Name: N/A    Number of Children: N/A  . Years of Education: N/A   Occupational History  . Not on file.   Social History Main Topics  . Smoking status: Never Smoker   . Smokeless tobacco: Never Used  . Alcohol Use: Yes     Comment: social  . Drug Use: No  . Sexual Activity: Yes   Other Topics Concern  . Not on file   Social History Narrative  . No narrative on file   The PMH, PSH, Social History, Family History, Medications, and allergies have been reviewed in [facility], and have been updated if relevant.  Review of Systems    See HPI No CP or SOB NO dizziness NO LE edema No HA Objective:    BP 132/84  Pulse 85  Temp(Src) 98.3 F (36.8 C) (Oral)  Wt 182 lb 8 oz (82.781 kg)  SpO2 97%   Physical Exam  Nursing note and vitals reviewed. Constitutional: She is oriented to person, place, and time. She appears well-developed and well-nourished. No distress.  HENT:  Head: Normocephalic and atraumatic.  Cardiovascular: Normal rate, regular rhythm and normal heart sounds.   Pulmonary/Chest: Effort normal and breath sounds normal.  Neurological: She is alert and oriented to person, place, and time.  Skin: Skin is warm and dry.  Psychiatric: She has a normal mood and affect. Her behavior is normal. Judgment and thought content normal.          Assessment & Plan:   Obesity, unspecified  Essential hypertension  Prediabetes No Follow-up on file.

## 2014-02-15 NOTE — Progress Notes (Signed)
   Subjective:    Patient ID: Mandy Torres, female    DOB: 06-08-1971, 43 y.o.   MRN: [id number]  HPI Comments: "Its still hurting"  Follow up sesamoiditis vs bursitis left foot  Foot Pain      Review of Systems no new findings or systemic changes    Objective:   Physical Exam Neurovascular status intact and unchanged pedal pulses palpable the fascia area is greatly improved orthotics have helped with plantar fasciitis there is no pain medial band plantar fascia however significant pain first MTP area bilateral. Previous is more on the left and right but now both are painful on direct compression with sesamoid area and on dorsiflexion or resisted plantar flexion. Patient also indicates she's been doing any exercise routinely to a lot of stooping clotting which may be putting a lot of excess pressure on the MTP joint of the foot as well. I suggested she discontinue or moderate that over the next couple of weeks. Orthopedic exam otherwise unremarkable no changes no new x-rays at this time. Assessment is continued sesamoiditis or capsulitis first MTP joint bilateral cannot rule out early neuroma symptomology hours definitely more in the sesamoid region and therefore recommend a steroid injection       Assessment & Plan:  Assessment bursitis first of your versus sesamoiditis bilateral. At this time injection tender with Kenalog 20 mg Xylocaine plain is injected divided between the left and right first MTP joint sesamoid area. Patient tolerated injections well was given instructions for warm compress ice pack continue with the [facility] or meloxicam anti-inflammatory as needed and maintain stable shoes. Avoid squatting and bending followup in one month if not improved.  Harriet Masson DPM

## 2014-02-15 NOTE — Patient Instructions (Signed)

## 2014-02-15 NOTE — Assessment & Plan Note (Signed)
Resolved. TSH and FT4 within normal limits. Discussed results today.

## 2014-02-15 NOTE — Assessment & Plan Note (Signed)
Improved with HCTZ 12.5 mg daily. Continue current dose- eRx sent. Call or return to clinic prn if these symptoms worsen or fail to improve as anticipated.

## 2014-02-15 NOTE — Assessment & Plan Note (Signed)
Stable

## 2014-03-04 ENCOUNTER — Telehealth: Payer: Self-pay | Admitting: Family Medicine

## 2014-03-04 NOTE — Telephone Encounter (Signed)
Pt's OBGYN will not be practicing any more due to his own personal illness. Pt will start coming to you for her yearly pap smears. She will also need for you to start refilling her birth control Loestrin. Pt uses [facility].   Pt also request referral for a neck ultrasound; right side. Her neck is continuing to bother her really bad.  Please advise.

## 2014-03-05 MED ORDER — NORETHINDRONE ACET-ETHINYL EST 1-20 MG-MCG PO TABS: 1.0000 | ORAL_TABLET | Freq: Every day | ORAL | Status: DC

## 2014-03-05 NOTE — Telephone Encounter (Signed)
Spoke to pt and advised per Dr Deborra Medina. BCP Rx filled and CPE scheduled for 03/2014

## 2014-03-05 NOTE — Telephone Encounter (Signed)
Yes I can do her pap smears and refill her OCPs.  Is she talking about a thyroid ultrasound?

## 2014-03-14 ENCOUNTER — Telehealth: Payer: Self-pay | Admitting: Family Medicine

## 2014-03-14 DIAGNOSIS — M542 Cervicalgia: Secondary | ICD-10-CM

## 2014-03-14 NOTE — Telephone Encounter (Signed)
Patient called to check on the order for the Neck US that was discussed last week. No order is in Epic. She is having right sided neck pain and wants to go to [facility] or [facility] on a Friday 2pm or later as they close at 1pm. She can be reached at work# [phone]. Please place the order.

## 2014-03-14 NOTE — Telephone Encounter (Signed)
Referral placed.

## 2014-03-15 ENCOUNTER — Ambulatory Visit (HOSPITAL_COMMUNITY)
Admission: RE | Admit: 2014-03-15 | Discharge: 2014-03-15 | Disposition: A | Discharge status: Home / Self Care | Payer: 59 | Source: Ambulatory Visit | Attending: Family Medicine | Admitting: Family Medicine

## 2014-03-15 DIAGNOSIS — M542 Cervicalgia: Secondary | ICD-10-CM | POA: Diagnosis present

## 2014-03-15 DIAGNOSIS — R599 Enlarged lymph nodes, unspecified: Secondary | ICD-10-CM | POA: Insufficient documentation

## 2014-03-19 ENCOUNTER — Other Ambulatory Visit: Payer: Self-pay | Admitting: Family Medicine

## 2014-03-19 DIAGNOSIS — E069 Thyroiditis, unspecified: Secondary | ICD-10-CM

## 2014-03-22 ENCOUNTER — Encounter: Payer: 59 | Admitting: Family Medicine

## 2014-03-26 ENCOUNTER — Other Ambulatory Visit (HOSPITAL_COMMUNITY): Payer: Self-pay | Admitting: Specialist

## 2014-03-26 DIAGNOSIS — M25561 Pain in right knee: Secondary | ICD-10-CM

## 2014-04-02 ENCOUNTER — Encounter: Payer: Self-pay | Admitting: Family Medicine

## 2014-04-02 ENCOUNTER — Ambulatory Visit (INDEPENDENT_AMBULATORY_CARE_PROVIDER_SITE_OTHER): Payer: 59 | Admitting: Family Medicine

## 2014-04-02 ENCOUNTER — Other Ambulatory Visit (HOSPITAL_COMMUNITY)
Admission: RE | Admit: 2014-04-02 | Discharge: 2014-04-02 | Disposition: A | Discharge status: Home / Self Care | Payer: 59 | Source: Ambulatory Visit | Attending: Family Medicine | Admitting: Family Medicine

## 2014-04-02 VITALS — BP 118/68 | HR 79 | Temp 98.4°F | Ht 62.0 in | Wt 179.5 lb

## 2014-04-02 DIAGNOSIS — Z113 Encounter for screening for infections with a predominantly sexual mode of transmission: Secondary | ICD-10-CM | POA: Insufficient documentation

## 2014-04-02 DIAGNOSIS — R7989 Other specified abnormal findings of blood chemistry: Secondary | ICD-10-CM

## 2014-04-02 DIAGNOSIS — I10 Essential (primary) hypertension: Secondary | ICD-10-CM

## 2014-04-02 DIAGNOSIS — N760 Acute vaginitis: Secondary | ICD-10-CM | POA: Insufficient documentation

## 2014-04-02 DIAGNOSIS — E069 Thyroiditis, unspecified: Secondary | ICD-10-CM

## 2014-04-02 DIAGNOSIS — E7849 Other hyperlipidemia: Secondary | ICD-10-CM

## 2014-04-02 DIAGNOSIS — Z1151 Encounter for screening for human papillomavirus (HPV): Secondary | ICD-10-CM | POA: Insufficient documentation

## 2014-04-02 DIAGNOSIS — E039 Hypothyroidism, unspecified: Secondary | ICD-10-CM | POA: Insufficient documentation

## 2014-04-02 DIAGNOSIS — F32A Depression, unspecified: Secondary | ICD-10-CM

## 2014-04-02 DIAGNOSIS — F329 Major depressive disorder, single episode, unspecified: Secondary | ICD-10-CM

## 2014-04-02 DIAGNOSIS — M25569 Pain in unspecified knee: Secondary | ICD-10-CM

## 2014-04-02 DIAGNOSIS — E669 Obesity, unspecified: Secondary | ICD-10-CM

## 2014-04-02 DIAGNOSIS — F3289 Other specified depressive episodes: Secondary | ICD-10-CM

## 2014-04-02 DIAGNOSIS — Z01419 Encounter for gynecological examination (general) (routine) without abnormal findings: Secondary | ICD-10-CM | POA: Insufficient documentation

## 2014-04-02 DIAGNOSIS — R946 Abnormal results of thyroid function studies: Secondary | ICD-10-CM

## 2014-04-02 DIAGNOSIS — R21 Rash and other nonspecific skin eruption: Secondary | ICD-10-CM

## 2014-04-02 DIAGNOSIS — E785 Hyperlipidemia, unspecified: Secondary | ICD-10-CM

## 2014-04-02 DIAGNOSIS — Z0000 Encounter for general adult medical examination without abnormal findings: Secondary | ICD-10-CM

## 2014-04-02 DIAGNOSIS — M25561 Pain in right knee: Secondary | ICD-10-CM | POA: Insufficient documentation

## 2014-04-02 LAB — T4, FREE: FREE T4: 0.51 ng/dL — AB (ref 0.60–1.60)

## 2014-04-02 LAB — TSH: TSH: 4.31 u[IU]/mL (ref 0.35–4.50)

## 2014-04-02 LAB — T3, FREE: T3, Free: 2.7 pg/mL (ref 2.3–4.2)

## 2014-04-02 NOTE — Progress Notes (Signed)
Subjective:   Patient ID: Mandy Torres, female    DOB: [birth year], 43 y.o.   MRN: [id number]  Mandy Torres is a pleasant 43 y.o. year old female who presents to clinic today with Annual Exam and Knee Pain  on 04/02/2014  HPI:  Last mammogram 04/2013. Last pap smear at [place] last year- having them yearly. Did have h/o abnormal pap smear. S/p oophorectomy in her 45s.  Since then, has been on OCPs.  HTN- Saw her on 7/17-  HTN/LE edema-  BP had been going up for about a year. Did endorse feeling "beet red" when she exercises.  No CP or SOB. Also noticed more LE edema at the end of the day. Started HCTZ 12.5 mg daily. Has noticed improved in lower extremity edema. No side effects.  Does feel like her BP has been under better control.  Lab Results  Component Value Date   CREATININE 0.77 01/25/2014      Wt Readings from Last 3 Encounters:  04/02/14 179 lb 8 oz (81.421 kg)  02/15/14 182 lb 8 oz (82.781 kg)  01/25/14 180 lb (81.647 kg)     Abnormal thyroid labs 6 months ago- In January 2015, TSH normal but Ft4 a little low at 0.74. Denied any symptoms of hypothyroidism other than difficulty losing weight. Labs done last month were normal FT 4 0.85. Lab Results  Component Value Date   TSH 4.302 01/25/2014   Thyroiditis confirmed on ultrasound 03/15/14-  Seeing endocrinologist.   H/o prediabetes- Lab Results  Component Value Date   HGBA1C 5.8* 01/25/2014   HLD- LDL improved Lab Results  Component Value Date   CHOL 207* 08/03/2013   HDL 46 08/03/2013   LDLCALC 120* 08/03/2013   LDLDIRECT 137* 01/25/2014   TRIG 203* 08/03/2013   CHOLHDL 4.5 08/03/2013   Knee pain- seeing Dr. Theda Torres. MRI right knee has been ordered by him and scheduled for 04/12/14. Has had multiple surgeries on that knee and cortisone injections no longer effective.  Also also been seeing Dr. Blenda Torres for morton's neuroma and sesamoiditis. Last saw him on 02/15/14- note reviewed.   Given cortisone  injections. Current Outpatient Prescriptions on File Prior to Visit  Medication Sig Dispense Refill  . cetirizine (ZYRTEC) 10 MG tablet Take 10 mg by mouth daily.      Marland Kitchen gabapentin (NEURONTIN) 100 MG capsule Take 100 mg by mouth 3 (three) times daily.      . hydrochlorothiazide (MICROZIDE) 12.5 MG capsule Take 1 capsule (12.5 mg total) by mouth daily.  90 capsule  3  . loratadine (CLARITIN) 10 MG tablet Take 10 mg by mouth daily.      . meloxicam (MOBIC) 15 MG tablet Take 1 tablet (15 mg total) by mouth daily.  30 tablet  2  . methocarbamol (ROBAXIN) 750 MG tablet Take 1 tablet (750 mg total) by mouth 4 (four) times daily.  120 tablet  0  . norethindrone-ethinyl estradiol (MICROGESTIN,JUNEL,LOESTRIN) 1-20 MG-MCG tablet Take 1 tablet by mouth daily.  1 Package  11  . nortriptyline (PAMELOR) 10 MG capsule TAKE 1 CAPSULE (10 MG TOTAL) BY MOUTH AT BEDTIME.  30 capsule  3  . rizatriptan (MAXALT) 10 MG tablet Take 1 tablet (10 mg total) by mouth as needed. May repeat in 2 hours if needed  9 tablet  3  . venlafaxine (EFFEXOR-XR) 75 MG 24 hr capsule Take 75 mg by mouth 3 (three) times daily.         Current Facility-Administered Medications  on File Prior to Visit  Medication Dose Route Frequency Provider Last Rate Last Dose  . triamcinolone acetonide (KENALOG) 10 MG/ML injection 10 mg  10 mg Intramuscular Once Mandy Torres, DPM      . triamcinolone acetonide (KENALOG) 10 MG/ML injection 10 mg  10 mg Intramuscular Once Mandy Torres, DPM      . triamcinolone acetonide (KENALOG) 10 MG/ML injection 10 mg  10 mg Other Once Mandy Torres, DPM        Allergies  Allergen Reactions  . Avelox [Moxifloxacin Hcl In Nacl] Hives  . Endocet [Oxycodone-Acetaminophen]   . Levofloxacin Nausea And Vomiting  . Penicillins Hives  . Percocet [Oxycodone-Acetaminophen] Nausea And Vomiting    Also Endocet  . Ranitidine Nausea And Vomiting  . Sulfa Antibiotics Hives  . Topamax     congnitive impairmant  . Valium      Hyperactivity     Past Medical History  Diagnosis Date  . History of breast biopsy 05/2001  . Allergy   . ADHD (attention deficit hyperactivity disorder)     Mandy Torres  . Depression   . DJD (degenerative joint disease), cervical     neurosurg - Dr. Wendall Torres at [facility]  . Migraine   . Hiatal hernia   . Foot neuroma   . TMJ disease     Past Surgical History  Procedure Laterality Date  . Cholecystectomy    . Knee surgery  11/97 and 4/93    right  . Oophorectomy    . Appendectomy    . Nasal sinus surgery  2006 and 1998    deviated septum, ethmoids, Dr. Caryn Torres then Mandy Torres  . Facial reconstruction surgery  2/88, 9/88, 2/89  . Breast surgery      normal biopsy 2002, left    Family History  Problem Relation Age of Onset  . Cancer Mother     Breast  . Cancer Sister     Half sister, Breast  . Cancer Maternal Aunt     Breast    History   Social History  . Marital Status: Single    Spouse Name: N/A    Number of Children: N/A  . Years of Education: N/A   Occupational History  . Not on file.   Social History Main Topics  . Smoking status: Never Smoker   . Smokeless tobacco: Never Used  . Alcohol Use: Yes     Comment: social  . Drug Use: No  . Sexual Activity: Yes   Other Topics Concern  . Not on file   Social History Narrative  . No narrative on file   The PMH, PSH, Social History, Family History, Medications, and allergies have been reviewed in [facility], and have been updated if relevant.  Review of Systems    See HPI Patient reports no  vision/ hearing changes,anorexia, weight change, fever ,adenopathy, persistant / recurrent hoarseness, swallowing issues, chest pain, edema,persistant / recurrent cough, hemoptysis, dyspnea(rest, exertional, paroxysmal nocturnal), gastrointestinal  bleeding (melena, rectal bleeding), abdominal pain, excessive heart burn, GU symptoms(dysuria, hematuria, pyuria, voiding/incontinence  Issues) syncope, focal weakness, severe  memory loss, concerning skin lesions, depression, anxiety, abnormal bruising/bleeding, major joint swelling, breast masses or abnormal vaginal bleeding.    Objective:    BP 118/68  Pulse 79  Temp(Src) 98.4 F (36.9 C) (Oral)  Ht 5\' 2"  (1.575 m)  Wt 179 lb 8 oz (81.421 kg)  BMI 32.82 kg/m2  SpO2 97%  LMP 03/15/2014   Physical Exam  General:  Well-developed,well-nourished,in no  acute distress; alert,appropriate and cooperative throughout examination Head:  normocephalic and atraumatic.   Eyes:  vision grossly intact, pupils equal, pupils round, and pupils reactive to light.   Ears:  R ear normal and L ear normal.   Nose:  no external deformity.   Mouth:  good dentition.   Neck:  No deformities, masses, or tenderness noted. Breasts:  No mass, nodules, thickening, tenderness, bulging, retraction, inflamation, nipple discharge or skin changes noted.   Lungs:  Normal respiratory effort, chest expands symmetrically. Lungs are clear to auscultation, no crackles or wheezes. Heart:  Normal rate and regular rhythm. S1 and S2 normal without gallop, murmur, click, rub or other extra sounds. Abdomen:  Bowel sounds positive,abdomen soft and non-tender without masses, organomegaly or hernias noted. Rectal:  no external abnormalities.   Genitalia:  Pelvic Exam:        External: normal female genitalia without lesions or masses        Vagina: normal without lesions or masses        Cervix: normal without lesions or masses        Adnexa: normal bimanual exam without masses or fullness        Uterus: normal by palpation        Pap smear: performed Msk:  No deformity or scoliosis noted of thoracic or lumbar spine.   Extremities:  No clubbing, cyanosis, edema, or deformity noted with normal full range of motion of all joints.   Neurologic:  alert & oriented X3 and gait normal.   Skin:   Dry flaky skin on hairline Cervical Nodes:  No lymphadenopathy noted Axillary Nodes:  No palpable  lymphadenopathy Psych:  Cognition and judgment appear intact. Alert and cooperative with normal attention span and concentration. No apparent delusions, illusions, hallucinations        Assessment & Plan:   Routine general medical examination at a health care facility  Hyperlipidemia, familial, high LDL  Essential hypertension  Depression  Abnormal thyroid blood test No Follow-up on file.

## 2014-04-02 NOTE — Assessment & Plan Note (Signed)
Pap smear today. Advised her to schedule mammogram. We also discussed weaning her off OCPs soon. The patient indicates understanding of these issues and agrees with the plan.

## 2014-04-02 NOTE — Assessment & Plan Note (Signed)
Followed by ortho. MRI scheduled.

## 2014-04-02 NOTE — Assessment & Plan Note (Signed)
Reviewed preventive care protocols, scheduled due services, and updated immunizations Discussed nutrition, exercise, diet, and healthy lifestyle.  

## 2014-04-02 NOTE — Patient Instructions (Signed)
Great to see you. We will call you with your lab results and I can route them to Dr. Cruzita Lederer. We will also call you with a dermatology referral.

## 2014-04-02 NOTE — Progress Notes (Signed)
Pre visit review using our clinic review tool, if applicable. No additional management support is needed unless otherwise documented below in the visit note. 

## 2014-04-02 NOTE — Assessment & Plan Note (Signed)
Improved.  Has been attending nutrition counseling and exercising more.

## 2014-04-02 NOTE — Addendum Note (Signed)
Addended by: Modena Nunnery on: 04/02/2014 10:47 AM   Modules accepted: Orders

## 2014-04-02 NOTE — Assessment & Plan Note (Signed)
Advised to keep appt with Dr. Cruzita Lederer. Will recheck thyroid function today and route to Dr. Cruzita Lederer.

## 2014-04-04 LAB — CYTOLOGY - PAP

## 2014-04-05 ENCOUNTER — Ambulatory Visit (INDEPENDENT_AMBULATORY_CARE_PROVIDER_SITE_OTHER): Payer: 59

## 2014-04-05 ENCOUNTER — Other Ambulatory Visit: Payer: Self-pay | Admitting: Family Medicine

## 2014-04-05 VITALS — BP 138/84 | HR 87 | Resp 14 | Ht 62.5 in | Wt 179.0 lb

## 2014-04-05 DIAGNOSIS — M2580 Other specified joint disorders, unspecified joint: Secondary | ICD-10-CM

## 2014-04-05 DIAGNOSIS — M779 Enthesopathy, unspecified: Secondary | ICD-10-CM

## 2014-04-05 DIAGNOSIS — M948X9 Other specified disorders of cartilage, unspecified sites: Secondary | ICD-10-CM

## 2014-04-05 DIAGNOSIS — M79606 Pain in leg, unspecified: Secondary | ICD-10-CM

## 2014-04-05 DIAGNOSIS — M722 Plantar fascial fibromatosis: Secondary | ICD-10-CM

## 2014-04-05 DIAGNOSIS — M79609 Pain in unspecified limb: Secondary | ICD-10-CM

## 2014-04-05 LAB — CERVICOVAGINAL ANCILLARY ONLY
BACTERIAL VAGINITIS: POSITIVE — AB
Bacterial vaginitis: POSITIVE — AB
CANDIDA VAGINITIS: NEGATIVE
HERPES (WINDOWPATH): NEGATIVE

## 2014-04-05 MED ORDER — METRONIDAZOLE 500 MG PO TABS: 500.0000 mg | ORAL_TABLET | Freq: Two times a day (BID) | ORAL | Status: AC

## 2014-04-05 MED ORDER — PREDNISONE 10 MG PO KIT: PACK | ORAL | Status: DC

## 2014-04-05 MED ORDER — ORLISTAT 60 MG PO CAPS: 60.0000 mg | ORAL_CAPSULE | Freq: Three times a day (TID) | ORAL | Status: DC

## 2014-04-05 NOTE — Progress Notes (Signed)
   Subjective:    Patient ID: Mandy Torres, female    DOB: 03/14/1971, 43 y.o.   MRN: [id number]  HPI Comments: Left - plantar MPJ still hurts to walk, it got better, then it started again.  Right - heel still hurt, the bunion got better, but it hurts between the 1st and 2nd metatarsals.  Foot Pain      Review of Systems no new findings or systemic changes noted     Objective:   Physical Exam Neurovascular status is intact and unchanged pedal pulses are palpable DP +2 PT plus one over 4 capillary refill time 3 seconds continues to have pain dorsal first MTP area right plantar sesamoid area first MTP area left is also some mild plantar fascial pain although not as bad as it had been previously orthotics are wearing well fit and contour well with full contact to the foot patient has intact early for steroid injection versus compacting painful and symptomatic may be having some difficulty adjusting to the orthoses at this time.       Assessment & Plan:  Assessment persistent capsulitis first MTP and sesamoiditis more so left than right plan at this time patient placed on a Sterapred [place] Dosepak x12 days recheck in one month for followup maintain warm compress ice pack remained stable shoe and orthoses may consider other noninvasive studies MRI or additional steroid injections the future based on findings  Harriet Masson DPM

## 2014-04-05 NOTE — Patient Instructions (Signed)
ICE INSTRUCTIONS  Apply ice or cold pack to the affected area at least 3 times a day for 10-15 minutes each time.  You should also use ice after prolonged activity or vigorous exercise.  Do not apply ice longer than 20 minutes at one time.  Always keep a cloth between your skin and the ice pack to prevent burns.  Being consistent and following these instructions will help control your symptoms.  We suggest you purchase a gel ice pack because they are reusable and do bit leak.  Some of them are designed to wrap around the area.  Use the method that works best for you.  Here are some other suggestions for icing.   Use a frozen bag of peas or corn-inexpensive and molds well to your body, usually stays frozen for 10 to 20 minutes.  Wet a towel with cold water and squeeze out the excess until it's damp.  Place in a bag in the freezer for 20 minutes. Then remove and use.  Alternate applying warm compress ice pack 10-15 minutes each repeat 2 or 3 times alternating between the 2 feet.

## 2014-04-08 ENCOUNTER — Other Ambulatory Visit: Payer: Self-pay | Admitting: Family Medicine

## 2014-04-09 ENCOUNTER — Ambulatory Visit (HOSPITAL_COMMUNITY): Payer: 59

## 2014-04-10 ENCOUNTER — Telehealth: Payer: Self-pay | Admitting: *Deleted

## 2014-04-10 NOTE — Telephone Encounter (Signed)
I'm a patient of Dr. Blenda Mounts.  He had put me on a steroid pack for 12 days.  I'm into my third day.  I'm losing my marbles and want to know when I can taper off these.  Do I need to do 6 days completely or can I taper off today?  Please give me a call.  I'm having a lot of trouble at work, I can't remember anything, kind of dizzy and feel disoriented sort of speak.  Please call me at work if you can't reach me.  I look forward to hearing from you tomorrow.  It's about 4:41pm.

## 2014-04-11 NOTE — Telephone Encounter (Signed)
If the medication is causing problems may taper off over 3 day., Takes refills today to pills tomorrow and only 1 the next day and discontinue thereafter. Better to taper down quickly rather than stopping completely. Keep followup appointment if symptoms persist.  Harriet Masson DPM

## 2014-04-11 NOTE — Telephone Encounter (Signed)
I called and left her a message that Dr. Blenda Mounts said it's best to taper off rather than stop suddenly.  He suggests you take 3 pills today, 2 tomorrow and one on Saturday.  If problem persists come in to see if, if not keep scheduled appointment.  Call if you have any further questions or concerns.

## 2014-04-12 ENCOUNTER — Ambulatory Visit (INDEPENDENT_AMBULATORY_CARE_PROVIDER_SITE_OTHER): Payer: 59 | Admitting: Internal Medicine

## 2014-04-12 ENCOUNTER — Ambulatory Visit (HOSPITAL_COMMUNITY)
Admission: RE | Admit: 2014-04-12 | Discharge: 2014-04-12 | Disposition: A | Discharge status: Home / Self Care | Payer: 59 | Source: Ambulatory Visit | Attending: Specialist | Admitting: Specialist

## 2014-04-12 ENCOUNTER — Encounter: Payer: Self-pay | Admitting: Internal Medicine

## 2014-04-12 VITALS — BP 112/70 | HR 78 | Temp 98.1°F | Resp 12 | Ht 62.5 in | Wt 183.4 lb

## 2014-04-12 DIAGNOSIS — E069 Thyroiditis, unspecified: Secondary | ICD-10-CM

## 2014-04-12 DIAGNOSIS — M25561 Pain in right knee: Secondary | ICD-10-CM | POA: Diagnosis present

## 2014-04-12 DIAGNOSIS — R599 Enlarged lymph nodes, unspecified: Secondary | ICD-10-CM | POA: Insufficient documentation

## 2014-04-12 DIAGNOSIS — R946 Abnormal results of thyroid function studies: Secondary | ICD-10-CM

## 2014-04-12 DIAGNOSIS — R7989 Other specified abnormal findings of blood chemistry: Secondary | ICD-10-CM

## 2014-04-12 DIAGNOSIS — R591 Generalized enlarged lymph nodes: Secondary | ICD-10-CM

## 2014-04-12 NOTE — Patient Instructions (Signed)
Please stop at the lab. You can have thyroid labs checked yearly by Dr Deborra Medina. Please return as needed.

## 2014-04-12 NOTE — Progress Notes (Signed)
Patient ID: Mandy Torres, female   DOB: 1970/08/29, 43 y.o.   MRN: [id number]   HPI  Mandy Torres is a 43 y.o.-year-old female, referred by her PCP, Dr. Deborra Medina, in consultation for thyroiditis, abnormal thyroid tests and an enlarged R cervical lymph node.  Last year she found an enlarged R sided nodule in upper R neck at the time she also had TMJ pbs >> saw ENT >> LN considered too small to Bx.  She then saw PCP >> U/S ordered >> LN 1.4 cm but also thyroiditis.  Pt feels pain in this nodule, hoarseness, dysphagia/odynophagia, SOB with lying down.  I reviewed pt's thyroid tests: Lab Results  Component Value Date   TSH 4.31 04/02/2014   TSH 4.302 01/25/2014   TSH 3.694 08/03/2013   TSH 2.27 04/01/2011   FREET4 0.51* 04/02/2014   FREET4 0.85 01/25/2014   FREET4 0.74* 08/03/2013    Thyroid U/S (03/15/2014):  1. Borderline enlarged lymph node of the right neck just superior to the right thyroid lobe may represent a reactive lymph node with  otherwise normal architecture.  2. Heterogeneous thyroid without focal nodule(s), most compatible with thyroiditis.  Reviewing the report, the LN is 1.4 cm in the largest dimension, appears to have a fatty hilum, and normal architecture.  Pt describes: - + fatigue - + weight gain - no cold intolerance - + constipation - + dry skin - + hair falling - + depression/+ anxiety - not new   She has + FH of thyroid disorders in: mother, 3 aunts. No FH of thyroid cancer.  No h/o radiation tx to head or neck. No recent use of iodine supplements.  I reviewed her chart and she also has a history of HTN, Prediabetes, depression, ADHD, HL, GERD, TMJ, hiatal hernia, OA, bilateral carpal tunnel.  ROS: Constitutional: + weight gain, + fatigue, no subjective hyperthermia/hypothermia Eyes: + blurry vision, no xerophthalmia ENT: no sore throat, + nodule palpated in neck, no dysphagia/odynophagia, no hoarseness, + decreased hearing Cardiovascular: no CP/SOB/+  palpitations/+ leg swelling Respiratory: no cough/SOB Gastrointestinal: no N/V/D/+ C/+ heartburn Musculoskeletal: + all: muscle/joint aches Skin: no rashes Neurological: no tremors/numbness/tingling/dizziness, + HA Psychiatric: + both: depression/anxiety  Past Medical History  Diagnosis Date  . History of breast biopsy 05/2001  . Allergy   . ADHD (attention deficit hyperactivity disorder)     [name]  . Depression   . DJD (degenerative joint disease), cervical     neurosurg - Dr. Wendall Stade at [facility]  . Migraine   . Hiatal hernia   . Foot neuroma   . TMJ disease    Past Surgical History  Procedure Laterality Date  . Cholecystectomy    . Knee surgery  11/97 and 4/93    right  . Oophorectomy    . Appendectomy    . Nasal sinus surgery  2006 and 1998    deviated septum, ethmoids, Dr. Caryn Section then Richardson Landry  . Facial reconstruction surgery  2/88, 9/88, 2/89  . Breast surgery      normal biopsy 2002, left   History   Occupational History  . Not on file.   Social History Main Topics  . Smoking status: Never Smoker   . Smokeless tobacco: Never Used  . Alcohol Use: Yes     Comment: social  . Drug Use: No  . Sexual Activity: Yes   Other Topics Concern  . 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 cetirizine (ZYRTEC) 10 MG tablet Take 10 mg by mouth daily.      Marland Kitchen gabapentin (NEURONTIN) 100 MG capsule Take 100 mg by mouth 3 (three) times daily.      . hydrochlorothiazide (MICROZIDE) 12.5 MG capsule Take 1 capsule (12.5 mg total) by mouth daily.  90 capsule  3  . lansoprazole (PREVACID) 15 MG capsule Take 15 mg by mouth daily at 12 noon.      . loratadine (CLARITIN) 10 MG tablet Take 10 mg by mouth daily.      . meloxicam (MOBIC) 15 MG tablet Take 1 tablet (15 mg  total) by mouth daily.  30 tablet  2  . methocarbamol (ROBAXIN) 750 MG tablet TAKE 1 TABLET BY MOUTH 4 TIMES DAILY  120 tablet  0  . methocarbamol (ROBAXIN) 750 MG tablet TAKE 1 TABLET BY MOUTH 4 TIMES DAILY  120 tablet  3  . metroNIDAZOLE (FLAGYL) 500 MG tablet Take 1 tablet (500 mg total) by mouth 2 (two) times daily.  14 tablet  0  . norethindrone-ethinyl estradiol (MICROGESTIN,JUNEL,LOESTRIN) 1-20 MG-MCG tablet Take 1 tablet by mouth daily.  1 Package  11  . nortriptyline (PAMELOR) 10 MG capsule TAKE 1 CAPSULE (10 MG TOTAL) BY MOUTH AT BEDTIME.  30 capsule  3  . orlistat (ALLI) 60 MG capsule Take 1 capsule (60 mg total) by mouth 3 (three) times daily with meals.      . PredniSONE 10 MG KIT Take as instructed for 12 days, 6 tablets days,1  and 2, 5 tablets days 3 and 4, for tablets days 5 and 6, and so on until: Done  48 each  0  . rizatriptan (MAXALT) 10 MG tablet Take 1 tablet (10 mg total) by mouth as needed. May repeat in 2 hours if needed  9 tablet  3  . venlafaxine (EFFEXOR-XR) 75 MG 24 hr capsule Take 75 mg by mouth 3 (three) times daily.         Current Facility-Administered Medications on File Prior to Visit  Medication Dose Route Frequency Provider Last Rate Last Dose  . triamcinolone acetonide (KENALOG) 10 MG/ML injection 10 mg  10 mg Intramuscular Once Harriet Masson, DPM      . triamcinolone acetonide (KENALOG) 10 MG/ML injection 10 mg  10 mg Intramuscular Once Harriet Masson, DPM      . triamcinolone acetonide (KENALOG) 10 MG/ML injection 10 mg  10 mg Other Once Harriet Masson, DPM       Allergies  Allergen Reactions  . Avelox [Moxifloxacin Hcl In Nacl] Hives  . Endocet [Oxycodone-Acetaminophen]   . Levofloxacin Nausea And Vomiting  . Penicillins Hives  . Percocet [Oxycodone-Acetaminophen] Nausea And Vomiting    Also Endocet  . Ranitidine Nausea And Vomiting  . Sulfa Antibiotics Hives  . Topamax     congnitive impairmant  . Valium     Hyperactivity    Family History   Problem Relation Age of Onset  . Cancer Mother     Breast  . Diabetes Mother   . Thyroid disease Mother   . Cancer Sister     Half sister, Breast  . Cancer Maternal Aunt     Breast  . Diabetes Maternal Aunt   . Thyroid disease Maternal Aunt   . Heart  disease Maternal Grandmother    PE: BP 112/70  Pulse 78  Temp(Src) 98.1 F (36.7 C) (Oral)  Resp 12  Ht 5' 2.5" (1.588 m)  Wt 183 lb 6.4 oz (83.19 kg)  BMI 32.99 kg/m2  SpO2 97%  LMP 03/15/2014 Wt Readings from Last 3 Encounters:  04/12/14 183 lb 6.4 oz (83.19 kg)  04/05/14 179 lb (81.194 kg)  04/02/14 179 lb 8 oz (81.421 kg)   Constitutional: overweight, in NAD Eyes: PERRLA, EOMI, no exophthalmos ENT: moist mucous membranes, no thyromegaly, + palpable R cervical LN - mobile, soft Cardiovascular: RRR, No MRG Respiratory: CTA B Gastrointestinal: abdomen soft, NT, ND, BS+ Musculoskeletal: no deformities, strength intact in all 4 Skin: moist, warm, no rashes Neurological: no tremor with outstretched hands, DTR normal in all 4  ASSESSMENT: 1. Thyroiditis  2. Abnormal TFTs  3. Enlarged LN in neck  PLAN:  1. Patient with heterogeneous appearance of her Thyroid on U/S. She appears euthyroid. She does not appear to have a goiter, thyroid nodules, or neck compression symptoms. - we discussed that this is usually the case in Hashimoto's thyroiditis, so we will check thyroid antibodies to see if she has this. If she does, I explained that this is an autoimmune ds and it will prompt more vigilant thyroid checks, likely the next in 6 months and annually afterwards. If she does not have Hashimoto's thyroiditis, she can have them checked annually by PCP. I explained that if she has Hashimoto's thyroiditis, she may have hypothyroid sxs even with normal TFTs.  2. Abnormal TFTs - pt had normal TSH levels but slightly low free T4  - see plan above  3. Enlarged LN in R neck - I reassured her that this appears reactive based on the  U/S imaging (especially as it has a fatty hilum).   Office Visit on 04/12/2014  Component Date Value Ref Range Status  . Thyroid Peroxidase Antibody 04/12/2014 152* <9 IU/mL Final   Comment: ** Please note change in reference range(s). **                                                     **Please note change in methodology. If re-baselining is needed, for                          patients who are being serially tested, please call customer service,                          within 3 days of collection, to request to add on the appropriate                          re-baselining test code for the previous methodology, at no charge.**  . Thyroglobulin Ab 04/12/2014 4* <2 IU/mL Final   Comment: ** Please note change in reference range(s). **                                                     **Please note change in methodology. If re-baselining is needed, for  patients who are being serially tested, please call customer service,                          within 3 days of collection, to request to add on the appropriate                          re-baselining test code for the previous methodology, at no charge.**   Msg sent: Dear Ms Evette Doffing, The thyroid antibodies are high, meaning that you do have Hashimoto's thyroiditis, as suspected. Sincerely, Philemon Kingdom MD

## 2014-04-13 LAB — THYROID PEROXIDASE ANTIBODY: Thyroperoxidase Ab SerPl-aCnc: 152 IU/mL — ABNORMAL HIGH (ref <9)

## 2014-04-13 LAB — THYROGLOBULIN ANTIBODY: Thyroglobulin Ab: 4 IU/mL — ABNORMAL HIGH (ref <2)

## 2014-04-15 ENCOUNTER — Other Ambulatory Visit: Payer: Self-pay

## 2014-04-25 ENCOUNTER — Telehealth: Payer: Self-pay

## 2014-04-25 NOTE — Telephone Encounter (Signed)
Noted. Thanks.

## 2014-04-25 NOTE — Telephone Encounter (Signed)
Pt said for 3-4 weeks pt has had intermittent pain rt lower quadrant (pt does not have appendix). Last 2-3 days pt has milky vaginal discharge with perineal itching; pt is not sure if vaginitis or yeast inf. Pt scheduled appt to see Dr Damita Dunnings 04-26-14 at 2 PM (pt requested afternoon appt.) if pt condition changes or worsens pt will cb prior to appt. Pt also wanted to schedule mammogram; advised pt if she gets screening mammo pt can schedule herself.Pt voiced understanding and will schedule mammo at [facility].

## 2014-04-26 ENCOUNTER — Encounter: Payer: Self-pay | Admitting: Family Medicine

## 2014-04-26 ENCOUNTER — Ambulatory Visit (INDEPENDENT_AMBULATORY_CARE_PROVIDER_SITE_OTHER): Payer: 59 | Admitting: Family Medicine

## 2014-04-26 VITALS — BP 120/82 | HR 93 | Temp 98.2°F | Wt 184.0 lb

## 2014-04-26 DIAGNOSIS — R109 Unspecified abdominal pain: Secondary | ICD-10-CM

## 2014-04-26 NOTE — Patient Instructions (Signed)
Oblique strain.   Relative rest- don't do what hurts.  Try a compression bandage/belt.  Side stretches and upward dog stretch.   Heat may help but if the pain gets worse you'll have to use an ice bag.

## 2014-04-26 NOTE — Progress Notes (Signed)
Pre visit review using our clinic review tool, if applicable. No additional management support is needed unless otherwise documented below in the visit note.  3-4 weeks of abd pain.  RLQ pain.  Doesn't have an appendix. Not painful all the time.  Intermittent aching.  Some h/o constipation.  Can feel it driving, if sitting, straining for a BM.  No urinary sx, no blood in stool.  No fevers.  No vomiting.    No L abd pain.  R ovary was prev removed. No RUQ pain.  No bruising or rash.    Tends to be worse as the day goes on.   Meds, vitals, and allergies reviewed.   ROS: See HPI.  Otherwise, noncontributory.  GEN: nad, alert and oriented HEENT: mucous membranes moist NECK: supple w/o LA CV: rrr. PULM: ctab, no inc wob ABD: soft, +bs, slightly ttp in the RLQ but no rebound.  Pain on testing abd wall muscles- seems to be limited to the abd wall itself but w/o hernia EXT: no edema SKIN: no acute rash

## 2014-04-28 DIAGNOSIS — R109 Unspecified abdominal pain: Secondary | ICD-10-CM | POA: Insufficient documentation

## 2014-04-28 NOTE — Assessment & Plan Note (Signed)
Pain on testing abd wall muscles- seems to be limited to the abd wall itself but w/o hernia D/w pt. Reassured.  No signs of ominous dx.  See instructions.  F/u prn.

## 2014-04-30 ENCOUNTER — Ambulatory Visit (INDEPENDENT_AMBULATORY_CARE_PROVIDER_SITE_OTHER): Payer: 59

## 2014-04-30 VITALS — BP 136/85 | HR 89 | Resp 13

## 2014-04-30 DIAGNOSIS — M79606 Pain in leg, unspecified: Secondary | ICD-10-CM

## 2014-04-30 DIAGNOSIS — M79673 Pain in unspecified foot: Secondary | ICD-10-CM

## 2014-04-30 DIAGNOSIS — M779 Enthesopathy, unspecified: Secondary | ICD-10-CM

## 2014-04-30 DIAGNOSIS — M2580 Other specified joint disorders, unspecified joint: Secondary | ICD-10-CM

## 2014-04-30 NOTE — Patient Instructions (Signed)
ICE INSTRUCTIONS  Apply ice or cold pack to the affected area at least 3 times a day for 10-15 minutes each time.  You should also use ice after prolonged activity or vigorous exercise.  Do not apply ice longer than 20 minutes at one time.  Always keep a cloth between your skin and the ice pack to prevent burns.  Being consistent and following these instructions will help control your symptoms.  We suggest you purchase a gel ice pack because they are reusable and do bit leak.  Some of them are designed to wrap around the area.  Use the method that works best for you.  Here are some other suggestions for icing.   Use a frozen bag of peas or corn-inexpensive and molds well to your body, usually stays frozen for 10 to 20 minutes.  Wet a towel with cold water and squeeze out the excess until it's damp.  Place in a bag in the freezer for 20 minutes. Then remove and use.  Alternate warm compress and ice pack 10 minutes each repeat 2 or 3 times every evening

## 2014-04-30 NOTE — Progress Notes (Signed)
   Subjective:    Patient ID: Mandy Torres, female    DOB: June 18, 1971, 43 y.o.   MRN: [id number]  HPI Comments: Pt states the areas on her feet, still hurt.  Pt states she complete one pack of the steroids but could not take the 2nd pack.  Foot Pain      Review of Systems no new findings or systemic changes noted     Objective:   Physical Exam Neurovascular status is intact and unchanged patient continues to have pain first MTP joint bilateral left much more so than right there is tenderness on palpation of the sesamoid particular tibial and fibular sesamoid plantar left more so than right some tenderness on range of motion as well patient been wearing orthotics as a steroid injections you try to Dosepak with little or no success temporary prevent couldn't tolerate Dosepak after the second round. Patient continues to have pain with walking activities and on ambulation no significant improvement been going on for at least 3 months now. Remainder the exam otherwise unremarkable unchanged previous x-rays reveal no fracture however cannot rule out suspect stress fracture the sesamoid or sesamoiditis versus a seronegative arthropathy versus a capsular tear at the MTP joint       Assessment & Plan:  Assessment recalcitrant capsulitis or metatarsalgia versus sesamoiditis of the first MTP joint cannot rule out arthropathy or flexor plate care or stress fracture at this time we'll arrange for an MRI to be done reappointed within the next week or 2 for reevaluation with MRI has been completed and further treatment based on findings of the MRI can be additional mobilization possibly even invasive or surgical options based on findings in the interim maintain ice and or hot and cold therapy to the area maintain NSAID or anti-inflammatories feet are tolerated maintain stable shoes and orthoses  Harriet Masson DPM

## 2014-05-01 ENCOUNTER — Encounter: Payer: Self-pay | Admitting: Family Medicine

## 2014-05-13 ENCOUNTER — Telehealth: Payer: Self-pay | Admitting: *Deleted

## 2014-05-13 NOTE — Telephone Encounter (Signed)
I was last in to see Dr. Blenda Mounts.  When I saw him, he wanted me to have an MRI of my feet.  I been waiting on them to call.  I called over there and they said they do not have the order.  If you would help me with that.  Thank you.  I called and informed her that we put in an order on 05/01/2014 for a MRI and faxed it as well.  Patient called you all and was told there was not an order.  "Can I get the MRN number?"  I gave it to here.  She stated the order was in the work queue and didn't know why patient was told that.  She said she would give the patient a call to get it scheduled.  I called and informed the patient that Burnett Kanaris should be giving you a call to schedule the MRI.  I informed her that the order was put in on 05/01/2014.  "She's actually on the line now.  Thank you."

## 2014-05-13 NOTE — Telephone Encounter (Signed)
"  I was there the other day.  He referred me to [facility] for MRI.  They told me to call if I hadn't heard from them.  The number they gave me is incorrect on that sheet that has the check offs."  I gave her the number to [facility], [phone].

## 2014-05-23 ENCOUNTER — Ambulatory Visit (HOSPITAL_COMMUNITY)
Admission: RE | Admit: 2014-05-23 | Discharge: 2014-05-23 | Disposition: A | Discharge status: Home / Self Care | Payer: 59 | Source: Ambulatory Visit

## 2014-05-23 DIAGNOSIS — M19071 Primary osteoarthritis, right ankle and foot: Secondary | ICD-10-CM | POA: Insufficient documentation

## 2014-05-23 DIAGNOSIS — M79673 Pain in unspecified foot: Secondary | ICD-10-CM

## 2014-05-23 DIAGNOSIS — R609 Edema, unspecified: Secondary | ICD-10-CM | POA: Diagnosis not present

## 2014-05-23 DIAGNOSIS — M8430XA Stress fracture, unspecified site, initial encounter for fracture: Secondary | ICD-10-CM | POA: Diagnosis present

## 2014-05-23 DIAGNOSIS — M79672 Pain in left foot: Secondary | ICD-10-CM | POA: Diagnosis present

## 2014-05-23 DIAGNOSIS — M79671 Pain in right foot: Secondary | ICD-10-CM | POA: Diagnosis present

## 2014-05-27 ENCOUNTER — Telehealth: Payer: Self-pay | Admitting: *Deleted

## 2014-05-27 NOTE — Telephone Encounter (Signed)
I was asked to leave a message here for Dr. Blenda Mounts to call me with MRI results for both feet, bilateral.  The MRI was done last Thursday at [facility].  Today I understand he is in the [place] office but as soon as he can give me a call.  Call me at work first.  However, I go to lunch from 11:30-12:30pm.  Our phones are put on then, so I'm not available from Monday - Thursday from 11:30 am until 1pm.  Friday we close at 1pm after that, you can reach me on my cell.  I already have an appointment on 12/02 to come in and discuss it with him but I do want to know something before then.  I had it done on November 12th at [facility].  Thank you.

## 2014-05-29 NOTE — Telephone Encounter (Signed)
Already spoke to patient.

## 2014-06-05 ENCOUNTER — Other Ambulatory Visit: Payer: Self-pay | Admitting: Family Medicine

## 2014-06-05 MED ORDER — GABAPENTIN 100 MG PO CAPS: 100.0000 mg | ORAL_CAPSULE | Freq: Three times a day (TID) | ORAL | Status: DC

## 2014-06-05 NOTE — Telephone Encounter (Signed)
Fax refill request, on fax it says last refilled on 03/25/14 #[id number], please advise

## 2014-06-11 ENCOUNTER — Ambulatory Visit (INDEPENDENT_AMBULATORY_CARE_PROVIDER_SITE_OTHER): Payer: 59

## 2014-06-11 VITALS — BP 120/82 | HR 91 | Resp 18

## 2014-06-11 DIAGNOSIS — G5760 Lesion of plantar nerve, unspecified lower limb: Secondary | ICD-10-CM

## 2014-06-11 DIAGNOSIS — M779 Enthesopathy, unspecified: Secondary | ICD-10-CM

## 2014-06-11 DIAGNOSIS — M79606 Pain in leg, unspecified: Secondary | ICD-10-CM

## 2014-06-11 DIAGNOSIS — M2580 Other specified joint disorders, unspecified joint: Secondary | ICD-10-CM

## 2014-06-11 DIAGNOSIS — M722 Plantar fascial fibromatosis: Secondary | ICD-10-CM

## 2014-06-11 MED ORDER — DICLOFENAC SODIUM 75 MG PO TBEC: 75.0000 mg | DELAYED_RELEASE_TABLET | Freq: Two times a day (BID) | ORAL | Status: DC

## 2014-06-11 NOTE — Progress Notes (Signed)
   Subjective:    Patient ID: Mandy Torres, female    DOB: Oct 02, 1970, 43 y.o.   MRN: [id number]  HPI My big toes are still hurting and the left big toe is worse and I can't walk normal on my left foot and looks swelled    Review of Systems no new findings or systemic changes noted     Objective:   Physical Exam Neurovascular status is unchanged first MTP area bilateral left more so than right painful symptomatic MRIs confirm sesamoid dysfunction and arthropathy of the MTP joint left more so than right no fracture no cysts no tumors.No tear in the flexor plate or joint capsule. No stress fractures identified. MRI confirms sesamoid dysfunction and inflammation as well as first MTP joint osteoarthropathy and degenerative changes left more so than right. Remainder the neurovascular status unchanged pedal pulses are palpable patient is been doing the meloxicam however no significant improvement could not take a second round of Sterapred although was beneficial as far as pain patient is also had pain in her patella for several years her knees have had arthritis and this is likely consistent with some arthropathy affecting both feet in the sesamoid metatarsal areas.       Assessment & Plan:  Assessment this time is sesamoiditis and sesamoid dysfunction with [place] arthropathy and capsulitis first MTP joint left more so than right plan at this time patient placed on an alternative NSAID prescription for diclofenac is given also at this time recommended ice and will arrange physical therapy 3 times a week for 3-4 weeks utilizing ultrasound iontophoresis and range of motion exercising recheck in 2 months for long-term follow-up  Harriet Masson DPM

## 2014-06-11 NOTE — Patient Instructions (Signed)
ICE INSTRUCTIONS  Apply ice or cold pack to the affected area at least 3 times a day for 10-15 minutes each time.  You should also use ice after prolonged activity or vigorous exercise.  Do not apply ice longer than 20 minutes at one time.  Always keep a cloth between your skin and the ice pack to prevent burns.  Being consistent and following these instructions will help control your symptoms.  We suggest you purchase a gel ice pack because they are reusable and do bit leak.  Some of them are designed to wrap around the area.  Use the method that works best for you.  Here are some other suggestions for icing.   Use a frozen bag of peas or corn-inexpensive and molds well to your body, usually stays frozen for 10 to 20 minutes.  Wet a towel with cold water and squeeze out the excess until it's damp.  Place in a bag in the freezer for 20 minutes. Then remove and use.  Follow up with physical therapy 3 times a week for 4 weeks as scheduled

## 2014-06-20 ENCOUNTER — Other Ambulatory Visit: Payer: 59

## 2014-06-20 ENCOUNTER — Encounter: Payer: Self-pay | Admitting: General Surgery

## 2014-06-20 ENCOUNTER — Ambulatory Visit (INDEPENDENT_AMBULATORY_CARE_PROVIDER_SITE_OTHER): Payer: Self-pay | Admitting: General Surgery

## 2014-06-20 VITALS — BP 138/80 | HR 84 | Resp 12 | Ht 62.0 in | Wt 179.0 lb

## 2014-06-20 DIAGNOSIS — E079 Disorder of thyroid, unspecified: Secondary | ICD-10-CM

## 2014-06-20 NOTE — Patient Instructions (Addendum)
Will relay report to Dr. Richardson Landry

## 2014-06-20 NOTE — Progress Notes (Signed)
Patient ID: Mandy Torres, female   DOB: 01/31/1971, 43 y.o.   MRN: [id number]  Chief Complaint  Patient presents with  . Follow-up    Evaluation of tyrhoid    HPI Mandy Torres is a 43 y.o. female here today for a evaluation of a nodule in right side of neck. Patient had a ultrasound done at [facility] in September  2015.  This showed a small lymph node above the thyroid and close to the major vessels. She notes the lump has gotten bigger. She was scheduled to have US guided FNA in radiology but she asked if it could be done here in office.  HPI  Past Medical History  Diagnosis Date  . History of breast biopsy 05/2001  . Allergy   . ADHD (attention deficit hyperactivity disorder)     Mandy Torres  . Depression   . DJD (degenerative joint disease), cervical     neurosurg - Dr. Wendall Stade at [facility]  . Migraine   . Hiatal hernia   . Foot neuroma   . TMJ disease     Past Surgical History  Procedure Laterality Date  . Cholecystectomy    . Knee surgery  11/97 and 4/93    right  . Oophorectomy    . Appendectomy    . Nasal sinus surgery  2006 and 1998    deviated septum, ethmoids, Dr. Caryn Section then Richardson Landry  . Facial reconstruction surgery  2/88, 9/88, 2/89  . Breast surgery      normal biopsy 2002, left    Family History  Problem Relation Age of Onset  . Cancer Mother     Breast  . Diabetes Mother   . Thyroid disease Mother   . Cancer Sister     Half sister, Breast  . Cancer Maternal Aunt     Breast  . Diabetes Maternal Aunt   . Thyroid disease Maternal Aunt   . Heart disease Maternal Grandmother     Social History History  Substance Use Topics  . Smoking status: Never Smoker   . Smokeless tobacco: Never Used  . Alcohol Use: Yes     Comment: social    Allergies  Allergen Reactions  . Avelox [Moxifloxacin Hcl In Nacl] Hives  . Endocet [Oxycodone-Acetaminophen] Nausea Only  . Levofloxacin Nausea And Vomiting  . Penicillins Hives and Nausea Only  .  Percocet [Oxycodone-Acetaminophen] Nausea And Vomiting    Also Endocet  . Ranitidine Nausea And Vomiting  . Sulfa Antibiotics Hives  . Topamax     congnitive impairmant  . Valium     Hyperactivity     Current Outpatient Prescriptions  Medication Sig Dispense Refill  . aspirin 81 MG tablet Take 81 mg by mouth daily.    Marland Kitchen azelastine (ASTELIN) 0.1 % nasal spray   99  . Azelastine HCl 0.15 % SOLN 2 sprays.     Marland Kitchen buPROPion (WELLBUTRIN XL) 150 MG 24 hr tablet Take 150 mg by mouth 2 (two) times daily.     . Calcium Carbonate Antacid (TUMS E-X PO) Take 2 tablets by mouth as needed.    . Chlorpheniramine-Pseudoeph (SUDAFED PLUS PO) Take 1 tablet by mouth every 4 (four) hours as needed.    . clobetasol (TEMOVATE) 0.05 % external solution   3  . diclofenac (VOLTAREN) 75 MG EC tablet Take 1 tablet (75 mg total) by mouth 2 (two) times daily. 60 tablet 2  . gabapentin (NEURONTIN) 100 MG capsule Take 1 capsule (100 mg total) by  mouth 3 (three) times daily. 90 capsule 3  . hydrochlorothiazide (MICROZIDE) 12.5 MG capsule Take 1 capsule (12.5 mg total) by mouth daily. 90 capsule 3  . lansoprazole (PREVACID) 15 MG capsule Take 15 mg by mouth daily at 12 noon.    . loratadine (CLARITIN) 10 MG tablet Take 10 mg by mouth daily.    . methocarbamol (ROBAXIN) 750 MG tablet TAKE 1 TABLET BY MOUTH 4 TIMES DAILY (Patient taking differently: prn) 120 tablet 3  . norethindrone-ethinyl estradiol (MICROGESTIN,JUNEL,LOESTRIN) 1-20 MG-MCG tablet   11  . rizatriptan (MAXALT) 10 MG tablet Take 1 tablet (10 mg total) by mouth as needed. May repeat in 2 hours if needed 9 tablet 3  . venlafaxine (EFFEXOR-XR) 75 MG 24 hr capsule Take 75 mg by mouth 3 (three) times daily.       No current facility-administered medications for this visit.    Review of Systems Review of Systems  Constitutional: Negative.   Respiratory: Negative.   Cardiovascular: Negative.     Blood pressure 138/80, pulse 84, resp. rate 12, height 5\' 2"   (1.575 m), weight 179 lb (81.194 kg), last menstrual period 06/18/2014.  Physical Exam Physical Exam  Data Reviewed Prior US  Assessment    Persistant  And enlarging node right side of neck.     Plan    FNA was done with US guidance. US showed  an oblong node at site of palpable finding in right submandibular area. It is medial and anterior to the IJV and Carotid artery. Max size is 2 cm. After 92ml1% xylocaine instilled, 22 Torres needle was used for FNA with guidance performed. 2 passes made. Cytology sent. Procedure well tolerated.       Mandy Torres 06/21/2014, 8:39 AM

## 2014-06-21 ENCOUNTER — Encounter: Payer: Self-pay | Admitting: General Surgery

## 2014-06-25 LAB — FINE-NEEDLE ASPIRATION

## 2014-07-12 HISTORY — PX: METATARSAL OSTEOTOMY WITH BUNIONECTOMY: SHX5662

## 2014-07-31 ENCOUNTER — Other Ambulatory Visit: Payer: Self-pay | Admitting: General Surgery

## 2014-07-31 ENCOUNTER — Encounter: Payer: Self-pay | Admitting: *Deleted

## 2014-07-31 DIAGNOSIS — L02411 Cutaneous abscess of right axilla: Secondary | ICD-10-CM

## 2014-07-31 MED ORDER — DOXYCYCLINE HYCLATE 100 MG PO CAPS: 100.0000 mg | ORAL_CAPSULE | Freq: Two times a day (BID) | ORAL | Status: DC

## 2014-07-31 NOTE — Patient Instructions (Signed)
Warm compresses.

## 2014-07-31 NOTE — Progress Notes (Signed)
She states she has a red hard knot right axilla area for a couple of days.Redness with minimal drainage noted. Culture obtained. Continue warm compresses.  ATB RX sent per Dr. Jamal Collin.

## 2014-08-01 ENCOUNTER — Telehealth: Payer: Self-pay | Admitting: *Deleted

## 2014-08-01 NOTE — Telephone Encounter (Signed)
Patient states pharmacy did not receive prescription.  I called the [facility] and spoke with [place] and gave her a verbal for the doxycycline.

## 2014-08-05 LAB — ANAEROBIC AND AEROBIC CULTURE

## 2014-08-12 ENCOUNTER — Telehealth: Payer: Self-pay | Admitting: *Deleted

## 2014-08-12 MED ORDER — FLUCONAZOLE 150 MG PO TABS: 150.0000 mg | ORAL_TABLET | Freq: Once | ORAL | Status: AC

## 2014-08-12 MED ORDER — LANSOPRAZOLE 15 MG PO CPDR: 15.0000 mg | DELAYED_RELEASE_CAPSULE | Freq: Every day | ORAL | Status: DC

## 2014-08-12 NOTE — Telephone Encounter (Signed)
rxs sent but needs to be seen if symptoms do not improve.

## 2014-08-12 NOTE — Telephone Encounter (Signed)
Spoke to pt and advised per Dr Aron; pt verbally expressed understanding.  

## 2014-08-12 NOTE — Telephone Encounter (Signed)
Patient left a voicemail requesting two prescriptions. Patient stated that she needs a rx for diflucan for a yeast infection which you have given to her before. Patient also request a rx for Prevacid which she can get cheaper with a prescription than buying it over the counter. Patient request that both scripts be sent to [facility].

## 2014-08-16 ENCOUNTER — Other Ambulatory Visit: Payer: Self-pay | Admitting: Family Medicine

## 2014-08-21 ENCOUNTER — Telehealth: Payer: Self-pay | Admitting: Family Medicine

## 2014-08-21 DIAGNOSIS — Z1239 Encounter for other screening for malignant neoplasm of breast: Secondary | ICD-10-CM

## 2014-08-21 NOTE — Telephone Encounter (Signed)
Done

## 2014-08-21 NOTE — Telephone Encounter (Signed)
Please put an order in for this patient to have a screening mammogram. Please choose external as she might go to [facility].

## 2014-09-10 ENCOUNTER — Encounter: Admit: 2014-09-10 | Disposition: A | Discharge status: Home / Self Care | Payer: Self-pay

## 2014-10-14 ENCOUNTER — Other Ambulatory Visit: Payer: Self-pay

## 2014-10-14 NOTE — Telephone Encounter (Signed)
No further refills after this one patient will need to schedule an appointment for follow-up.

## 2014-11-20 ENCOUNTER — Ambulatory Visit (INDEPENDENT_AMBULATORY_CARE_PROVIDER_SITE_OTHER): Payer: 59 | Admitting: Podiatry

## 2014-11-20 ENCOUNTER — Ambulatory Visit (INDEPENDENT_AMBULATORY_CARE_PROVIDER_SITE_OTHER): Payer: 59

## 2014-11-20 VITALS — BP 126/83 | HR 100 | Resp 16

## 2014-11-20 DIAGNOSIS — M2580 Other specified joint disorders, unspecified joint: Secondary | ICD-10-CM

## 2014-11-20 DIAGNOSIS — M779 Enthesopathy, unspecified: Secondary | ICD-10-CM | POA: Diagnosis not present

## 2014-11-20 MED ORDER — METHYLPREDNISOLONE 4 MG PO TBPK: ORAL_TABLET | ORAL | Status: DC

## 2014-11-20 MED ORDER — DICLOFENAC SODIUM 75 MG PO TBEC: 75.0000 mg | DELAYED_RELEASE_TABLET | Freq: Two times a day (BID) | ORAL | Status: DC

## 2014-11-21 ENCOUNTER — Encounter: Payer: Self-pay | Admitting: Family Medicine

## 2014-11-21 ENCOUNTER — Ambulatory Visit (INDEPENDENT_AMBULATORY_CARE_PROVIDER_SITE_OTHER): Payer: 59 | Admitting: Family Medicine

## 2014-11-21 VITALS — BP 114/68 | HR 96 | Temp 98.1°F | Ht 62.0 in | Wt 178.5 lb

## 2014-11-21 DIAGNOSIS — R5383 Other fatigue: Secondary | ICD-10-CM | POA: Insufficient documentation

## 2014-11-21 DIAGNOSIS — N926 Irregular menstruation, unspecified: Secondary | ICD-10-CM

## 2014-11-21 DIAGNOSIS — M533 Sacrococcygeal disorders, not elsewhere classified: Secondary | ICD-10-CM | POA: Insufficient documentation

## 2014-11-21 DIAGNOSIS — R635 Abnormal weight gain: Secondary | ICD-10-CM | POA: Diagnosis not present

## 2014-11-21 LAB — H. PYLORI ANTIBODY, IGG: H PYLORI IGG: NEGATIVE

## 2014-11-21 LAB — FOLLICLE STIMULATING HORMONE: FSH: 6.1 m[IU]/mL

## 2014-11-21 LAB — LUTEINIZING HORMONE: LH: 4.13 m[IU]/mL

## 2014-11-21 NOTE — Progress Notes (Signed)
Subjective:   Patient ID: Mandy Torres, female    DOB: [birth year], 44 y.o.   MRN: [id number]  Mandy Torres is a pleasant 44 y.o. year old female who presents to clinic today with Weight Loss  on 11/21/2014  HPI:  Several issues she would like to discuss today.  Weight loss- wants to lose weight.  Having a hard time exercising now- seeing Dr. Milinda Torres for foot pain.  Getting injections but may ultimately require surgery.  Wt Readings from Last 3 Encounters:  11/21/14 178 lb 8 oz (80.967 kg)  06/20/14 179 lb (81.194 kg)  04/26/14 184 lb (83.462 kg)    Not tracking her calories but trying to make better eating choices.  Left hip pain- ongoing for a couple of weeks.  Worse when sits for long periods of time or crosses her foot over her thigh.  No known injury but she was walking up hill prior to it starting.  ?menopausal- stopped OCPs like we discussed a few months ago.  Periods have been irregular.  Wants blood work.  No hot flashes.  Seeing endo and ENT for thyroiditis.  Current Outpatient Prescriptions on File Prior to Visit  Medication Sig Dispense Refill  . Azelastine HCl 0.15 % SOLN 2 sprays.     . Calcium Carbonate Antacid (TUMS E-X PO) Take 2 tablets by mouth as needed.    . Chlorpheniramine-Pseudoeph (SUDAFED PLUS PO) Take 1 tablet by mouth every 4 (four) hours as needed.    . clobetasol (TEMOVATE) 0.05 % external solution   3  . diclofenac (VOLTAREN) 75 MG EC tablet Take 1 tablet (75 mg total) by mouth 2 (two) times daily. 60 tablet 5  . hydrochlorothiazide (MICROZIDE) 12.5 MG capsule Take 1 capsule (12.5 mg total) by mouth daily. 90 capsule 3  . lansoprazole (PREVACID) 15 MG capsule Take 1 capsule (15 mg total) by mouth daily at 12 noon. 30 capsule 6  . methocarbamol (ROBAXIN) 750 MG tablet TAKE 1 TABLET BY MOUTH 4 TIMES DAILY (Patient taking differently: prn) 120 tablet 3  . methylPREDNISolone (MEDROL) 4 MG TBPK tablet Tapering 6 day dose pack 21 tablet 0  .  rizatriptan (MAXALT) 10 MG tablet TAKE 1 TABLET BY MOUTH AS NEEDED. MAY REPEAT IN 2 HOURS IF NEEDED 9 tablet 3  . venlafaxine (EFFEXOR-XR) 75 MG 24 hr capsule Take 75 mg by mouth 3 (three) times daily.      . norethindrone-ethinyl estradiol (MICROGESTIN,JUNEL,LOESTRIN) 1-20 MG-MCG tablet   11   No current facility-administered medications on file prior to visit.    Allergies  Allergen Reactions  . Avelox [Moxifloxacin Hcl In Nacl] Hives  . Endocet [Oxycodone-Acetaminophen] Nausea Only  . Levofloxacin Nausea And Vomiting  . Penicillins Hives and Nausea Only  . Percocet [Oxycodone-Acetaminophen] Nausea And Vomiting    Also Endocet  . Ranitidine Nausea And Vomiting  . Sulfa Antibiotics Hives  . Topamax     congnitive impairmant  . Valium     Hyperactivity     Past Medical History  Diagnosis Date  . History of breast biopsy 05/2001  . Allergy   . ADHD (attention deficit hyperactivity disorder)     Mandy Torres  . Depression   . DJD (degenerative joint disease), cervical     neurosurg - Dr. Wendall Torres at [facility]  . Migraine   . Hiatal hernia   . Foot neuroma   . TMJ disease     Past Surgical History  Procedure Laterality Date  . Cholecystectomy    .  Knee surgery  11/97 and 4/93    right  . Oophorectomy    . Appendectomy    . Nasal sinus surgery  2006 and 1998    deviated septum, ethmoids, Dr. Caryn Torres then Mandy Torres  . Facial reconstruction surgery  2/88, 9/88, 2/89  . Breast surgery      normal biopsy 2002, left    Family History  Problem Relation Age of Onset  . Cancer Mother     Breast  . Diabetes Mother   . Thyroid disease Mother   . Cancer Sister     Half sister, Breast  . Cancer Maternal Aunt     Breast  . Diabetes Maternal Aunt   . Thyroid disease Maternal Aunt   . Heart disease Maternal Grandmother     History   Social History  . Marital Status: Single    Spouse Name: N/A  . Number of Children: N/A  . Years of Education: N/A   Occupational History    . Not on file.   Social History Main Topics  . Smoking status: Never Smoker   . Smokeless tobacco: Never Used  . Alcohol Use: Yes     Comment: social  . Drug Use: No  . Sexual Activity: Yes   Other Topics Concern  . Not on file   Social History Narrative   Single   0 children   0 pregnancies    First menstrual cycle: 44 yrs old         The PMH, PSH, Social History, Family History, Medications, and allergies have been reviewed in [facility], and have been updated if relevant.    Review of Systems  Constitutional: Positive for fatigue.  HENT: Negative.   Eyes: Negative.   Respiratory: Negative.   Gastrointestinal: Negative for nausea, vomiting, abdominal pain, diarrhea, constipation, blood in stool, abdominal distention, anal bleeding and rectal pain.       Worsening GERD symptoms  Endocrine: Negative.   Genitourinary: Negative.   Musculoskeletal: Positive for back pain.  Neurological: Negative.   Hematological: Negative.   Psychiatric/Behavioral: Negative.   All other systems reviewed and are negative.      Objective:    BP 114/68 mmHg  Pulse 96  Temp(Src) 98.1 F (36.7 C) (Oral)  Ht 5\' 2"  (1.575 m)  Wt 178 lb 8 oz (80.967 kg)  BMI 32.64 kg/m2  SpO2 98%  LMP 11/10/2014   Physical Exam  Constitutional: She is oriented to person, place, and time. She appears well-developed and well-nourished. No distress.  HENT:  Head: Normocephalic.  Eyes: Conjunctivae are normal.  Neck: Neck supple.  Cardiovascular: Normal rate.   Pulmonary/Chest: Effort normal.  Abdominal: Soft.  Musculoskeletal: She exhibits no edema.  +fabers left No pain with internal or external rotation of the hip  Neurological: She is alert and oriented to person, place, and time. No cranial nerve deficit.  Skin: Skin is warm and dry.  Psychiatric: She has a normal mood and affect. Her behavior is normal. Judgment and thought content normal.  Nursing note and vitals reviewed.          Assessment & Plan:   Weight gain  Irregular periods - Plan: Luteinizing hormone, Follicle Stimulating Hormone  Other fatigue - Plan: H. pylori antibody, IgG No Follow-up on file.

## 2014-11-21 NOTE — Progress Notes (Signed)
Pre visit review using our clinic review tool, if applicable. No additional management support is needed unless otherwise documented below in the visit note. 

## 2014-11-21 NOTE — Assessment & Plan Note (Signed)
>  25 minutes spent in face to face time with patient, >50% spent in counselling or coordination of care discussing her multiple questions.  Advised keeping log of calories, such as my fitness pal, weight watchers and other apps. After she has done this and her thyroiditis has resolved, we could possible consider weight loss rx for short period of time.  See AVS.

## 2014-11-21 NOTE — Assessment & Plan Note (Signed)
Likely multifactorial- lifestyle, weight gain (denies depression), thyroiditis.  Will check H pylori today given her worsening GERD as well. The patient indicates understanding of these issues and agrees with the plan.

## 2014-11-21 NOTE — Patient Instructions (Signed)
Two weight loss medications we could consider:  Belviq  Phentermine  We will call you with your lab results.

## 2014-11-21 NOTE — Progress Notes (Signed)
She presents today as a previous patient of Dr. Erasmo Downer with continued pain of the first metatarsophalangeal joint left foot. She states that some of the pain is under here she points to the plantar aspect of the metatarsal metatarsophalangeal joint left as well as the dorsal aspect. She denies changes in her past medical history medications allergies and social history.  Objective: Vital signs are stable alert and oriented 3. Pulses are fungal and palpable. Neurologic sensorium is intact. She has full range of motion of the first metatarsophalangeal joint of the left foot though it is painful on palpation particularly to the tibial sesamoid. Review of previous MRIs and radiographs demonstrate negative history of AVN at that time however hallux abductovalgus deformity is mild but may be resulting in dysfunction of the tibial sesamoid. She has mild tenderness on end range of motion of the first metatarsophalangeal joint dorsiflexion and plantarflexion.  Assessment: Capsulitis sesamoiditis first metatarsophalangeal joint left foot.  Plan: Discussed etiology pathology conservative versus surgical therapies. At this point we initiated the use of a Medrol Dosepak and performed an intra-articular dexamethasone injection. This is performed after sterile Betadine skin prep. I also examined her orthotics which does have an offloading met pad plantar aspect of the foot. I will follow-up with her in 1 month.

## 2014-11-21 NOTE — Assessment & Plan Note (Signed)
New- given handout from sports med advisor with exercises she can do. Call or return to clinic prn if these symptoms worsen or fail to improve as anticipated. The patient indicates understanding of these issues and agrees with the plan.

## 2014-11-22 ENCOUNTER — Encounter: Payer: Self-pay | Admitting: Family Medicine

## 2014-11-25 ENCOUNTER — Encounter: Payer: Self-pay | Admitting: Family Medicine

## 2014-11-26 ENCOUNTER — Telehealth: Payer: Self-pay | Admitting: Podiatry

## 2014-11-26 NOTE — Telephone Encounter (Signed)
PT CALLED ASKING IF YOU COULD CALL HER BACK PLEASE. SHE CALLED [facility] AND WAS TOLD TO CALL HERE. IF AFTER 5PM PLEASE CALL HER CELL.

## 2014-11-27 NOTE — Telephone Encounter (Signed)
Called patient-she had questions regarding surgery. Told her I would ask Dr Milinda Pointer and call her back with more info.

## 2014-11-27 NOTE — Telephone Encounter (Signed)
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

## 2014-11-29 ENCOUNTER — Telehealth: Payer: Self-pay

## 2014-11-29 NOTE — Telephone Encounter (Signed)
Pt left v/m requesting 11/21/14 lab results; advised pt per my chart note lab results as instructed. Pt voiced understanding and will go to my chart for values; will cb if needed.

## 2014-12-13 ENCOUNTER — Encounter: Payer: Self-pay | Admitting: Family Medicine

## 2015-01-01 ENCOUNTER — Ambulatory Visit (INDEPENDENT_AMBULATORY_CARE_PROVIDER_SITE_OTHER): Payer: 59 | Admitting: Podiatry

## 2015-01-01 ENCOUNTER — Encounter: Payer: Self-pay | Admitting: Podiatry

## 2015-01-01 VITALS — BP 128/80 | HR 93 | Resp 17 | Ht 62.5 in | Wt 175.0 lb

## 2015-01-01 DIAGNOSIS — M2580 Other specified joint disorders, unspecified joint: Secondary | ICD-10-CM

## 2015-01-01 DIAGNOSIS — Q667 Congenital pes cavus: Secondary | ICD-10-CM | POA: Diagnosis not present

## 2015-01-01 DIAGNOSIS — M216X9 Other acquired deformities of unspecified foot: Secondary | ICD-10-CM

## 2015-01-01 DIAGNOSIS — M2012 Hallux valgus (acquired), left foot: Secondary | ICD-10-CM | POA: Diagnosis not present

## 2015-01-01 NOTE — Progress Notes (Signed)
She presents today for follow-up of her bunion deformity in her sesamoid height is. He states that the injection lasted for approximately 1 week. I'm tired of this hurting and I would like to have this fixed. She states is being 1 on for quite some time we do have a history of an MRI being performed as well as multiple x-rays and injection therapies.  Objective: Vital signs are stable she's alert and oriented 3 pulses are palpable. I have reviewed her past medical history medications allergies surgeries social histories. Pulses are palpable left. She has pain on range of motion of the first metatarsophalangeal joint and on palpation of the tibial sesamoid directly. She also has pain on palpation and range of motion of the second metatarsophalangeal joint. No hammertoe deformity noted.  Assessment: Chronic intractable sesamoiditis and capsulitis with hallux valgus deformity left foot. Osteoarthritic changes of the tibial sesamoid left foot.  Plan: We discussed the etiology pathology conservative versus surgical therapies. At this point we discussed an [facility] bunion repair with screw fixation and a second metatarsal osteotomy with screw fixation we went over the consent form line by line number via number giving ample time to ask questions she saw regarding these procedures I answered them to the best of my ability in layman's terms demonstrating on a bone model. She understood this was amenable to it and signed operative consent form. She was dispensed a cam walker for her postop recovery.

## 2015-02-05 ENCOUNTER — Telehealth: Payer: Self-pay | Admitting: *Deleted

## 2015-02-05 NOTE — Telephone Encounter (Signed)
"  I'm scheduled for surgery on 08/19 with Dr. Milinda Pointer.  I'd like to get the diagnosis codes and procedure codes for my surgery so I can check my insurance.  I'd also like to know if you have done pre-certification.  If so, what will be my cost?"  Some from the [place] office will return your call regarding your questions.  "Please tell them I go to lunch between 11:30am and 12:30pm, I'm at work."

## 2015-02-05 NOTE — Telephone Encounter (Signed)
Spoke with Mandy Torres regarding her cost of the procedure for dr hyatt's charge.  Her estimated cost was $351.46 that is after the deductible is met , she would need to meet deductible first before insurance would cover at 80%

## 2015-02-06 ENCOUNTER — Telehealth: Payer: Self-pay | Admitting: Family Medicine

## 2015-02-06 NOTE — Telephone Encounter (Signed)
"  I'm sorry.  When I spoke to [place], I must not have written down all the numbers for the diagnosis codes.  Can you give me the diagnosis code for 28296 and 28308?"  Bunion diagnosis is M20.10 and code for 28308 is probably [id number].  "Okay, thank you for your help."

## 2015-02-06 NOTE — Telephone Encounter (Signed)
Pt has appt with Dr Deborra Medina on 02/10/15.

## 2015-02-06 NOTE — Telephone Encounter (Signed)
Patient Name: Mandy Torres DOB: February 11, 1971 Initial Comment Caller states has been on birth control, had intercourse on the 15th, started cramping on the 16th and 17th and has some bleeding. still having spotting. Nurse Assessment Guidelines Guideline Title Affirmed Question Affirmed Notes Final Disposition User FINAL ATTEMPT MADE - no message left [place], Therapist, sports, Kermit Balo

## 2015-02-06 NOTE — Telephone Encounter (Signed)
A note has already been sent to Dr Deborra Medina from [facility]; pt has appt with Dr Deborra Medina on 02/10/15.

## 2015-02-06 NOTE — Telephone Encounter (Signed)
PLEASE NOTE: All timestamps contained within this report are represented as [place] Standard Time. CONFIDENTIALTY NOTICE: This fax transmission is intended only for the addressee. It contains information that is legally privileged, confidential or otherwise protected from use or disclosure. If you are not the intended recipient, you are strictly prohibited from reviewing, disclosing, copying using or disseminating any of this information or taking any action in reliance on or regarding this information. If you have received this fax in error, please notify us immediately by telephone so that we can arrange for its return to [place]. Phone: [phone], Toll-Free: [phone], Fax: [phone] Page: 1 of 1 Call Id: [id number] [place] Patient Name: Mandy Torres DOB: 1971/03/14 Initial Comment Caller ( aplogises for not answering out of state ph number, she is waiting for the call again) states has been on birth control, had intercourse on the 15th, started cramping on the16th and 17th and has some bleeding. still having spotting. Nurse Assessment Nurse: Mechele Dawley, RN, Amy Date/Time Eilene Ghazi Time): 02/06/2015 10:04:05 AM Confirm and document reason for call. If symptomatic, describe symptoms. ---Caller states that she has been on bcp and stopped. She started back in July - was sexually active 15th - 17th. She states that she is having some spotting for 2 weeks. She states that the only thing different is that she was sexually active. She only has one ovary on the left side. She sees Dr. Deborra Medina at [facility]. She had a gyn appt first of the year. She is having some cramping but not sure if it is coming from constipation or not. Has the patient traveled out of the country within the last 30 days? ---Not Applicable Does the patient require triage? ---Yes Related visit to physician within the last 2  weeks? ---No Does the PT have any chronic conditions? (i.e. diabetes, asthma, etc.) ---Yes List chronic conditions. ---constipation, ddd, carpal tunnel, foot surgery, Did the patient indicate they were pregnant? ---No Guidelines Guideline Title Affirmed Question Affirmed Notes Vaginal Bleeding - Abnormal [1] Bleeding or spotting occurs after sex (Exception: first intercourse) AND [2] lasts > 7 days Final Disposition User See PCP When Office is Open (within 3 days) [place], Therapist, sports, Amy Disagree/Comply: Comply

## 2015-02-10 ENCOUNTER — Ambulatory Visit (INDEPENDENT_AMBULATORY_CARE_PROVIDER_SITE_OTHER): Payer: 59 | Admitting: Family Medicine

## 2015-02-10 ENCOUNTER — Encounter: Payer: Self-pay | Admitting: Family Medicine

## 2015-02-10 VITALS — BP 114/66 | HR 115 | Temp 98.0°F | Wt 179.5 lb

## 2015-02-10 DIAGNOSIS — N926 Irregular menstruation, unspecified: Secondary | ICD-10-CM | POA: Diagnosis not present

## 2015-02-10 LAB — HCG, QUANTITATIVE, PREGNANCY: Quantitative HCG: 0 m[IU]/mL

## 2015-02-10 NOTE — Progress Notes (Signed)
Pre visit review using our clinic review tool, if applicable. No additional management support is needed unless otherwise documented below in the visit note. 

## 2015-02-10 NOTE — Patient Instructions (Signed)
Good to see you. I will call you with your lab results.  Please stop by to see Mandy Torres on your way out.

## 2015-02-10 NOTE — Progress Notes (Signed)
Subjective:   Patient ID: Mandy Torres, female    DOB: March 21, 1971, 44 y.o.   MRN: [id number]  Mandy Torres is a pleasant 44 y.o. year old female who presents to clinic today with Menorrhagia  on 02/10/2015  HPI:  Had sex for the first time in 2 years 2 weeks ago, since then, has been having almost daily vaginal bleeding- spotting, has been brownish and bright red blood.  Some left sided lower abdominal cramping.  Remote h/o right oophorectomy for ovarian mass. On OCPs- compliant with taking daily.  Did not use condoms.  No fever, nausea or vomiting.  Did not have any symptoms with her right ovarian mass prior to it being discovered.  OTC urine pregnancy test negative.  Current Outpatient Prescriptions on File Prior to Visit  Medication Sig Dispense Refill  . Azelastine HCl 0.15 % SOLN 2 sprays.     . Calcium Carbonate Antacid (TUMS E-X PO) Take 2 tablets by mouth as needed.    . Chlorpheniramine-Pseudoeph (SUDAFED PLUS PO) Take 1 tablet by mouth every 4 (four) hours as needed.    . clobetasol (TEMOVATE) 0.05 % external solution   3  . diclofenac (VOLTAREN) 75 MG EC tablet Take 1 tablet (75 mg total) by mouth 2 (two) times daily. 60 tablet 5  . hydrochlorothiazide (MICROZIDE) 12.5 MG capsule Take 1 capsule (12.5 mg total) by mouth daily. 90 capsule 3  . lamoTRIgine (LAMICTAL) 25 MG tablet Take 25 mg by mouth daily.    . lansoprazole (PREVACID) 15 MG capsule Take 1 capsule (15 mg total) by mouth daily at 12 noon. 30 capsule 6  . methocarbamol (ROBAXIN) 750 MG tablet TAKE 1 TABLET BY MOUTH 4 TIMES DAILY (Patient taking differently: prn) 120 tablet 3  . norethindrone-ethinyl estradiol (MICROGESTIN,JUNEL,LOESTRIN) 1-20 MG-MCG tablet   11  . rizatriptan (MAXALT) 10 MG tablet TAKE 1 TABLET BY MOUTH AS NEEDED. MAY REPEAT IN 2 HOURS IF NEEDED 9 tablet 3  . venlafaxine (EFFEXOR-XR) 75 MG 24 hr capsule Take 150 mg by mouth at bedtime.      No current facility-administered medications on  file prior to visit.    Allergies  Allergen Reactions  . Avelox [Moxifloxacin Hcl In Nacl] Hives  . Endocet [Oxycodone-Acetaminophen] Nausea Only  . Levofloxacin Nausea And Vomiting  . Penicillins Hives and Nausea Only  . Percocet [Oxycodone-Acetaminophen] Nausea And Vomiting    Also Endocet  . Ranitidine Nausea And Vomiting  . Sulfa Antibiotics Hives  . Topamax     congnitive impairmant  . Valium     Hyperactivity     Past Medical History  Diagnosis Date  . History of breast biopsy 05/2001  . Allergy   . ADHD (attention deficit hyperactivity disorder)     Mandy Torres  . Depression   . DJD (degenerative joint disease), cervical     neurosurg - Dr. Wendall Stade at [facility]  . Migraine   . Hiatal hernia   . Foot neuroma   . TMJ disease     Past Surgical History  Procedure Laterality Date  . Cholecystectomy    . Knee surgery  11/97 and 4/93    right  . Oophorectomy    . Appendectomy    . Nasal sinus surgery  2006 and 1998    deviated septum, ethmoids, Dr. Caryn Section then Richardson Landry  . Facial reconstruction surgery  2/88, 9/88, 2/89  . Breast surgery      normal biopsy 2002, left    Family History  Problem Relation Age of Onset  . Cancer Mother     Breast  . Diabetes Mother   . Thyroid disease Mother   . Cancer Sister     Half sister, Breast  . Cancer Maternal Aunt     Breast  . Diabetes Maternal Aunt   . Thyroid disease Maternal Aunt   . Heart disease Maternal Grandmother     History   Social History  . Marital Status: Single    Spouse Name: N/A  . Number of Children: N/A  . Years of Education: N/A   Occupational History  . Not on file.   Social History Main Topics  . Smoking status: Never Smoker   . Smokeless tobacco: Never Used  . Alcohol Use: Yes     Comment: social  . Drug Use: No  . Sexual Activity: Yes   Other Topics Concern  . Not on file   Social History Narrative   Single   0 children   0 pregnancies    First menstrual cycle: 44 yrs old          The PMH, PSH, Social History, Family History, Medications, and allergies have been reviewed in [facility], and have been updated if relevant.   Review of Systems  Constitutional: Negative.   HENT: Negative.   Respiratory: Negative.   Endocrine: Negative.   Genitourinary: Positive for vaginal bleeding, menstrual problem and pelvic pain. Negative for dysuria, frequency, hematuria, flank pain, vaginal discharge, enuresis, difficulty urinating, genital sores, vaginal pain and dyspareunia.  Skin: Negative.   Neurological: Negative.   Hematological: Negative.   Psychiatric/Behavioral: Negative.   All other systems reviewed and are negative.      Objective:    BP 114/66 mmHg  Pulse 115  Temp(Src) 98 F (36.7 C) (Oral)  Wt 179 lb 8 oz (81.421 kg)  SpO2 98%  LMP 01/25/2015   Physical Exam  Constitutional: She is oriented to person, place, and time. She appears well-developed and well-nourished. No distress.  HENT:  Head: Normocephalic.  Eyes: Conjunctivae are normal.  Neck: Normal range of motion.  Cardiovascular: Normal rate.   Pulmonary/Chest: Effort normal.  Abdominal: Soft. Normal appearance. There is tenderness in the left lower quadrant. There is no rigidity, no rebound, no guarding, no CVA tenderness, no tenderness at McBurney's point and negative Murphy's sign.  Musculoskeletal: Normal range of motion. She exhibits no edema.  Neurological: She is alert and oriented to person, place, and time. No cranial nerve deficit.  Skin: Skin is warm and dry.  Psychiatric: She has a normal mood and affect. Her behavior is normal. Thought content normal.  Nursing note and vitals reviewed.         Assessment & Plan:   Irregular periods - Plan: US Transvaginal Non-OB, hCG, quantitative, pregnancy No Follow-up on file.

## 2015-02-10 NOTE — Assessment & Plan Note (Signed)
New with some mild tenderness on exam - left lower quadrant. Differential is wide and discussed with pt.  Too early for urine pregnancy test to be valid.  HCG quant (serum) done today to rule out pregnancy although still quite early. Also will get a transvaginal and pelvic US to further evaluate left ovary to rule out cysts/masses. Could certainly be from physical trauma of intercourse as well. The patient indicates understanding of these issues and agrees with the plan.  Orders Placed This Encounter  Procedures  . US Transvaginal Non-OB  . hCG, quantitative, pregnancy

## 2015-02-11 ENCOUNTER — Encounter: Payer: Self-pay | Admitting: Family Medicine

## 2015-02-11 ENCOUNTER — Telehealth: Payer: Self-pay

## 2015-02-11 NOTE — Telephone Encounter (Signed)
Pt left v/m requesting cb with vaginal/pelvic US results when available; pt had done at [facility] imaging 02/10/15.

## 2015-02-11 NOTE — Telephone Encounter (Signed)
See result note.  

## 2015-02-18 ENCOUNTER — Other Ambulatory Visit: Payer: Self-pay | Admitting: Family Medicine

## 2015-02-20 ENCOUNTER — Telehealth: Payer: Self-pay | Admitting: *Deleted

## 2015-02-20 NOTE — Telephone Encounter (Signed)
Pt states she would like to pick up her post-op medications prior to the surgery.  Dr. Milinda Pointer states our office does not order post-op medications, prior to the surgery.  Called [phone] and informed pt of Dr. Milinda Pointer and our office's policy.

## 2015-02-27 ENCOUNTER — Other Ambulatory Visit: Payer: Self-pay | Admitting: Podiatry

## 2015-02-27 MED ORDER — PROMETHAZINE HCL 25 MG PO TABS: 25.0000 mg | ORAL_TABLET | Freq: Three times a day (TID) | ORAL | Status: DC | PRN

## 2015-02-27 MED ORDER — MEPERIDINE HCL 50 MG PO TABS: ORAL_TABLET | ORAL | Status: DC

## 2015-02-27 MED ORDER — CLINDAMYCIN HCL 150 MG PO CAPS: 150.0000 mg | ORAL_CAPSULE | Freq: Three times a day (TID) | ORAL | Status: DC

## 2015-02-28 ENCOUNTER — Encounter: Payer: Self-pay | Admitting: *Deleted

## 2015-02-28 DIAGNOSIS — M2012 Hallux valgus (acquired), left foot: Secondary | ICD-10-CM | POA: Diagnosis not present

## 2015-02-28 DIAGNOSIS — M21542 Acquired clubfoot, left foot: Secondary | ICD-10-CM | POA: Diagnosis not present

## 2015-03-03 ENCOUNTER — Encounter: Payer: Self-pay | Admitting: *Deleted

## 2015-03-03 DIAGNOSIS — M79673 Pain in unspecified foot: Secondary | ICD-10-CM

## 2015-03-03 NOTE — Progress Notes (Signed)
FMLA paperwork completed and faxed to [phone]. Originals mailed to patient's home.

## 2015-03-04 ENCOUNTER — Telehealth: Payer: Self-pay | Admitting: *Deleted

## 2015-03-04 NOTE — Progress Notes (Signed)
Surgery, Austin Bunionectomy left and Metatarsal Osteotomy 2 digit left foot, performed at [facility].

## 2015-03-04 NOTE — Telephone Encounter (Signed)
I'm calling to see how you are doing.  "I'm doing well."  Are you keeping your foot elevated?  "Yes, I am and I tell you it gets old quick."  What's been your pain experience?  "It hasn't been bad at all.  I took my last pain pill on yesterday."  How was your experience at the surgical center?  "Oh it was excellent, everything went smoothly and everyone was so nice."  Okay that's great.  We have you scheduled for an appointment on tomorrow with Dr. Milinda Pointer.  "Yes, it's at 8:45am."

## 2015-03-05 ENCOUNTER — Encounter: Payer: Self-pay | Admitting: Podiatry

## 2015-03-05 ENCOUNTER — Ambulatory Visit (INDEPENDENT_AMBULATORY_CARE_PROVIDER_SITE_OTHER): Payer: 59

## 2015-03-05 ENCOUNTER — Ambulatory Visit (INDEPENDENT_AMBULATORY_CARE_PROVIDER_SITE_OTHER): Payer: 59 | Admitting: Podiatry

## 2015-03-05 VITALS — BP 135/80 | HR 94 | Resp 16

## 2015-03-05 DIAGNOSIS — M779 Enthesopathy, unspecified: Secondary | ICD-10-CM | POA: Diagnosis not present

## 2015-03-05 DIAGNOSIS — Z9889 Other specified postprocedural states: Secondary | ICD-10-CM

## 2015-03-05 DIAGNOSIS — M2012 Hallux valgus (acquired), left foot: Secondary | ICD-10-CM

## 2015-03-05 NOTE — Progress Notes (Signed)
She presents today 1 week status post Austin bunion repair with screw fixation left foot also status post second metatarsal osteotomy and screw fixation left foot. She says that she's been doing very well and has not been taking very much pain medication at all. She states that she continues to wear the Cam Walker on a regular basis and keep it elevated.  Objective: Vital signs are stable alert and oriented 3. Pulses are strongly palpable. Neurologic sensorium is intact per Semmes-Weinstein monofilament. Foot does not demonstrate erythema cellulitis drainage or odor incision sites appear to be healing very nicely mild edema is noted 3 views radiographs taken today demonstrate osseous healing status post 1 week first and second metatarsal osteotomy with screw fixation. The alignment is noted.  Assessment: Well-healing surgical foot left. Status post [name] and second metatarsal osteotomy.  Plan: Redress today dry sterile compressive dressing follow-up with me in 1 week.  Roselind Messier DPM

## 2015-03-12 ENCOUNTER — Ambulatory Visit (INDEPENDENT_AMBULATORY_CARE_PROVIDER_SITE_OTHER): Payer: 59 | Admitting: Podiatry

## 2015-03-12 ENCOUNTER — Encounter: Payer: Self-pay | Admitting: Podiatry

## 2015-03-12 VITALS — BP 111/68 | HR 96 | Resp 18

## 2015-03-12 DIAGNOSIS — M2012 Hallux valgus (acquired), left foot: Secondary | ICD-10-CM | POA: Diagnosis not present

## 2015-03-12 DIAGNOSIS — Z9889 Other specified postprocedural states: Secondary | ICD-10-CM

## 2015-03-12 NOTE — Progress Notes (Signed)
She presents today 2 weeks status post Austin bunionectomy left with screw in the second metatarsal osteotomy left with screw. She denies fever chills nausea vomiting muscle aches and pains and states it seems to be doing quite well.  Objective: Vital signs are stable she is alert and oriented 3. He has great range of motion of the first metatarsophalangeal joint as well as the second metatarsophalangeal joint on active and passive ranges of motion. Minimal edema and no signs of infection. Margins are well coapted and sutures were removed.  Assessment: Well-healing surgical foot left status post Austin bunion repair and second metatarsal osteotomy 2 weeks.  Plan: Placed her in a compression anklet today after sutures were removed I encouraged her to continue range of motion exercises and allow her to start washing this foot. And she was placed in a Darco shoe. Follow up with her in 2 weeks for another set of x-rays

## 2015-03-26 ENCOUNTER — Ambulatory Visit (INDEPENDENT_AMBULATORY_CARE_PROVIDER_SITE_OTHER): Payer: 59 | Admitting: Podiatry

## 2015-03-26 ENCOUNTER — Encounter: Payer: Self-pay | Admitting: Podiatry

## 2015-03-26 ENCOUNTER — Ambulatory Visit (INDEPENDENT_AMBULATORY_CARE_PROVIDER_SITE_OTHER): Payer: 59

## 2015-03-26 VITALS — BP 135/82 | HR 86 | Resp 18

## 2015-03-26 DIAGNOSIS — M2012 Hallux valgus (acquired), left foot: Secondary | ICD-10-CM

## 2015-03-26 DIAGNOSIS — Z9889 Other specified postprocedural states: Secondary | ICD-10-CM

## 2015-03-26 NOTE — Progress Notes (Signed)
She presents today for her month follow-up visit regarding her [name] bunion repair left foot and her second metatarsophalangeal joint osteotomy. She states this seems to be doing very well and has little to complain about. She states that she continues her range of motion exercises daily. She is very pleased with her outcome.  Objective: Vital signs are stable she is alert and oriented 3. Pulses are strongly palpable. She has minimal edema great range of motion of the first and second metatarsophalangeal joints. Radiographs taken 3 views in the office today of the left foot demonstrate well-healed osteotomy first and second left. She has some mild contracture deformity at the second metatarsophalangeal joint secondary to the scar.  Assessment: Well-healing surgical foot left 1 month.  Plan: Discussed etiology pathology conservative versus surgical therapies. We dispensed a Darco digital splint. I also discussed shoe gear wear. She was start wearing tennis shoes or her Darco shoe and I will follow-up with her in 1 month.

## 2015-03-27 ENCOUNTER — Other Ambulatory Visit: Payer: Self-pay | Admitting: Family Medicine

## 2015-03-27 NOTE — Telephone Encounter (Deleted)
Electronic refill request, no on med list and

## 2015-04-01 ENCOUNTER — Telehealth: Payer: Self-pay | Admitting: Family Medicine

## 2015-04-01 DIAGNOSIS — E069 Thyroiditis, unspecified: Secondary | ICD-10-CM

## 2015-04-01 NOTE — Telephone Encounter (Signed)
Noted  Referral placed.

## 2015-04-01 NOTE — Telephone Encounter (Signed)
Dr Minette Brine  Correct phone number  Is [phone] She spoke to [employer]

## 2015-04-01 NOTE — Telephone Encounter (Signed)
Pt called she wanted to make an with endocrinology  dr Minette Brine @ [facility] [phone] She was told she needed to get a referral from primary dr  She can go any day or time.  1st choice friday

## 2015-04-01 NOTE — Telephone Encounter (Signed)
What is the referral for? 

## 2015-04-01 NOTE — Telephone Encounter (Signed)
Spoke to Mandy Torres who states that she was referred to Dr Cruzita Lederer for thyroiditis, but it "didnt go so well," and is wanting to see Dr Buddy Duty

## 2015-04-03 ENCOUNTER — Other Ambulatory Visit: Payer: Self-pay | Admitting: Family Medicine

## 2015-04-03 ENCOUNTER — Telehealth: Payer: Self-pay | Admitting: Family Medicine

## 2015-04-03 DIAGNOSIS — E069 Thyroiditis, unspecified: Secondary | ICD-10-CM

## 2015-04-03 NOTE — Telephone Encounter (Signed)
Faxed Referral request to Dr Devota Pace office at [facility] with all Thyroid labs, FNA and [place] results that were in her chart. They were dated 03/2014. Dr Gabriel Carina sent you back a note that I  Will put in your in box stating that:Its not clear why she needs to be seen. Acute Thyroiditis will typically self resolve. ( Consider getting repeat thyroid labs.) and if the labs are normal , she would not need an Endocrinology referral. Spoke with patient and she is asking you to please order thyroid labs on her because she says the symptoms for thyroiditis that she had are back, fatigue,weight gain, moodiness,forgetfullness. Please call the patient when the lab orders are put in Epic. Her work number is [phone]. She is off on Friday afternoons and would like to have the labs done then.

## 2015-04-04 NOTE — Telephone Encounter (Signed)
Spoke to [place] who states Mandy Torres is on a call and wanted her to take a message. Bennie Pierini to tell pt, "what she asked for, has been completed."

## 2015-04-04 NOTE — Telephone Encounter (Signed)
Noted  Labs ordered

## 2015-04-11 ENCOUNTER — Other Ambulatory Visit (INDEPENDENT_AMBULATORY_CARE_PROVIDER_SITE_OTHER): Payer: 59

## 2015-04-11 DIAGNOSIS — E069 Thyroiditis, unspecified: Secondary | ICD-10-CM

## 2015-04-11 LAB — T4, FREE: Free T4: 0.74 ng/dL (ref 0.60–1.60)

## 2015-04-11 LAB — TSH: TSH: 4.4 u[IU]/mL (ref 0.35–4.50)

## 2015-04-11 LAB — T3, FREE: T3 FREE: 2.9 pg/mL (ref 2.3–4.2)

## 2015-04-16 ENCOUNTER — Telehealth: Payer: Self-pay

## 2015-04-16 MED ORDER — LAMOTRIGINE 25 MG PO TABS: 25.0000 mg | ORAL_TABLET | Freq: Every day | ORAL | Status: DC

## 2015-04-16 MED ORDER — BUPROPION HCL ER (XL) 150 MG PO TB24: 150.0000 mg | ORAL_TABLET | Freq: Two times a day (BID) | ORAL | Status: DC

## 2015-04-16 MED ORDER — VENLAFAXINE HCL ER 75 MG PO CP24: 150.0000 mg | ORAL_CAPSULE | Freq: Every day | ORAL | Status: DC

## 2015-04-16 NOTE — Telephone Encounter (Signed)
Pt left v/m; pt in middle of changing psychiatrist due to previous psychiatrist is no longer on pts ins and pt looking for new psychaitrist and request Dr Deborra Medina to refill lamictal,venlafaxine and bupropion until can get established with new [place].pt request cb.

## 2015-04-17 ENCOUNTER — Telehealth: Payer: Self-pay | Admitting: Family Medicine

## 2015-04-17 NOTE — Telephone Encounter (Signed)
Please call [phone]  thank you

## 2015-04-17 NOTE — Telephone Encounter (Signed)
Pt called back regarding refills on medications while in transition between psychiatrists. Please call [phone]-

## 2015-04-18 NOTE — Telephone Encounter (Signed)
Spoke to pt and informed her Rx sent to pharmacy

## 2015-04-23 ENCOUNTER — Encounter: Payer: 59 | Admitting: Podiatry

## 2015-04-30 ENCOUNTER — Encounter: Payer: Self-pay | Admitting: Podiatry

## 2015-04-30 ENCOUNTER — Ambulatory Visit (INDEPENDENT_AMBULATORY_CARE_PROVIDER_SITE_OTHER): Payer: 59 | Admitting: Podiatry

## 2015-04-30 ENCOUNTER — Ambulatory Visit: Payer: Self-pay

## 2015-04-30 VITALS — BP 128/80 | HR 90 | Resp 16

## 2015-04-30 DIAGNOSIS — Z9889 Other specified postprocedural states: Secondary | ICD-10-CM

## 2015-04-30 DIAGNOSIS — M2012 Hallux valgus (acquired), left foot: Secondary | ICD-10-CM

## 2015-04-30 NOTE — Progress Notes (Signed)
She presents today for follow-up of her left surgical foot date of surgery 02/28/2015. She states that she is doing very well without complications. She states that she gets some soreness beneath second metatarsophalangeal joint on occasions.  Objective: Vital signs are stable alert and oriented 3. Pulses are strongly palpable. Redressed confirm well-healing surgical foot great range of motion of the first and second metatarsophalangeal joints. No reproducible pain.  Assessment: Well-healing surgical foot left.  Plan: Follow up with her in 1 month if she desires otherwise released.

## 2015-05-05 ENCOUNTER — Other Ambulatory Visit: Payer: Self-pay | Admitting: Family Medicine

## 2015-05-06 ENCOUNTER — Other Ambulatory Visit: Payer: Self-pay | Admitting: *Deleted

## 2015-05-06 ENCOUNTER — Telehealth: Payer: 59 | Admitting: Family

## 2015-05-06 DIAGNOSIS — L237 Allergic contact dermatitis due to plants, except food: Secondary | ICD-10-CM

## 2015-05-06 MED ORDER — METHYLPREDNISOLONE 4 MG PO TBPK: ORAL_TABLET | ORAL | Status: DC

## 2015-05-06 MED ORDER — LAMOTRIGINE 25 MG PO TABS: 25.0000 mg | ORAL_TABLET | Freq: Two times a day (BID) | ORAL | Status: DC

## 2015-05-06 NOTE — Progress Notes (Signed)

## 2015-05-06 NOTE — Telephone Encounter (Signed)
Pt requesting medication refill. Pt states original Rx was not from Dr Deborra Medina, but she approved it until pt can be seen by DrKapur. Pt states that she takes med BID, not once daily, and is needing new Rx to reflect correct instruction. Pls advise

## 2015-05-06 NOTE — Telephone Encounter (Signed)
Ok to refill rx as pt requests.

## 2015-05-13 ENCOUNTER — Encounter: Payer: Self-pay | Admitting: Emergency Medicine

## 2015-05-13 ENCOUNTER — Ambulatory Visit
Admission: EM | Admit: 2015-05-13 | Discharge: 2015-05-13 | Disposition: A | Discharge status: Home / Self Care | Payer: 59 | Attending: Family Medicine | Admitting: Family Medicine

## 2015-05-13 DIAGNOSIS — J0100 Acute maxillary sinusitis, unspecified: Secondary | ICD-10-CM | POA: Diagnosis not present

## 2015-05-13 DIAGNOSIS — L237 Allergic contact dermatitis due to plants, except food: Secondary | ICD-10-CM

## 2015-05-13 MED ORDER — DOXYCYCLINE HYCLATE 100 MG PO TABS: 100.0000 mg | ORAL_TABLET | Freq: Two times a day (BID) | ORAL | Status: DC

## 2015-05-13 MED ORDER — PREDNISONE 20 MG PO TABS: ORAL_TABLET | ORAL | Status: DC

## 2015-05-13 NOTE — ED Notes (Signed)
Patient c/o itchy red rash on her arms and legs over a week ago.  Patient states previously treated with a 6 day course of Prednisone.  Patient c/o sinus congestion and pressure for a week.

## 2015-05-13 NOTE — ED Provider Notes (Signed)
CSN: [id number]     Arrival date & time 05/13/15  1849 History   First MD Initiated Contact with Patient 05/13/15 1926     Chief Complaint  Patient presents with  . Facial Pain  . Rash   (Consider location/radiation/quality/duration/timing/severity/associated sxs/prior Treatment) HPI Comments: 44 yo female with a  2-3 weeks h/o nasal congestion, sinus headaches, sinus pressure that has worsened over the last week. Also complains of exposure to poison ivy about 2 weeks ago with development of itchy, red, rash. States was treated with prednisone for 6 days and rash improved but did not resolve completely and now seems to be worsening again/spreading.  The history is provided by the patient.    Past Medical History  Diagnosis Date  . History of breast biopsy 05/2001  . Allergy   . ADHD (attention deficit hyperactivity disorder)     [name]  . Depression   . DJD (degenerative joint disease), cervical     neurosurg - Dr. Wendall Stade at [facility]  . Migraine   . Hiatal hernia   . Foot neuroma   . TMJ disease    Past Surgical History  Procedure Laterality Date  . Cholecystectomy    . Knee surgery  11/97 and 4/93    right  . Oophorectomy    . Appendectomy    . Nasal sinus surgery  2006 and 1998    deviated septum, ethmoids, Dr. Caryn Section then Richardson Landry  . Facial reconstruction surgery  2/88, 9/88, 2/89  . Breast surgery      normal biopsy 2002, left   Family History  Problem Relation Age of Onset  . Cancer Mother     Breast  . Diabetes Mother   . Thyroid disease Mother   . Cancer Sister     Half sister, Breast  . Cancer Maternal Aunt     Breast  . Diabetes Maternal Aunt   . Thyroid disease Maternal Aunt   . Heart disease Maternal Grandmother    Social History  Substance Use Topics  . Smoking status: Never Smoker   . Smokeless tobacco: Never Used  . Alcohol Use: Yes     Comment: social   OB History    No data available     Review of Systems  Allergies  Avelox;  Endocet; Levofloxacin; Penicillins; Percocet; Ranitidine; Sulfa antibiotics; Topamax; and Valium  Home Medications   Prior to Admission medications   Medication Sig Start Date End Date Taking? Authorizing Provider  Azelastine HCl 0.15 % SOLN 2 sprays.  08/07/09   Historical Provider, MD  buPROPion (WELLBUTRIN XL) 150 MG 24 hr tablet Take 1 tablet (150 mg total) by mouth 2 (two) times daily. 04/16/15   Lucille Passy, MD  Calcium Carbonate Antacid (TUMS E-X PO) Take 2 tablets by mouth as needed.    Historical Provider, MD  Chlorpheniramine-Pseudoeph (SUDAFED PLUS PO) Take 1 tablet by mouth every 4 (four) hours as needed.    Historical Provider, MD  clobetasol (TEMOVATE) 0.05 % external solution  05/10/14   Historical Provider, MD  diclofenac (VOLTAREN) 75 MG EC tablet Take 1 tablet (75 mg total) by mouth 2 (two) times daily. 11/20/14   Max T Hyatt, DPM  doxycycline (VIBRA-TABS) 100 MG tablet Take 1 tablet (100 mg total) by mouth 2 (two) times daily. 05/13/15   Norval Gable, MD  hydrochlorothiazide (MICROZIDE) 12.5 MG capsule TAKE 1 CAPSULE (12.5 MG TOTAL) BY MOUTH DAILY. 02/18/15   Lucille Passy, MD  lamoTRIgine (LAMICTAL) 25 MG  tablet Take 1 tablet (25 mg total) by mouth 2 (two) times daily. 05/06/15   Lucille Passy, MD  lansoprazole (PREVACID) 15 MG capsule Take 1 capsule (15 mg total) by mouth daily at 12 noon. 08/12/14   Lucille Passy, MD  LARIN 1/20 1-20 MG-MCG tablet TAKE 1 TABLET BY MOUTH DAILY. 03/27/15   Lucille Passy, MD  meperidine (DEMEROL) 50 MG tablet Take one to two capsules by mouth every six to eight hours as needed for pain. 02/27/15   Max T Hyatt, DPM  methocarbamol (ROBAXIN) 750 MG tablet TAKE 1 TABLET BY MOUTH 4 TIMES DAILY Patient taking differently: prn 04/08/14   Lucille Passy, MD  methylPREDNISolone (MEDROL DOSEPAK) 4 MG TBPK tablet Take according to package 05/06/15   Kennyth Arnold, FNP  predniSONE (DELTASONE) 20 MG tablet 3 tabs po qd for 2 days, then 2 tabs po qd for 3 days, then 1  tab po qd for 3 days, then half a tab po qd for 2 days 05/13/15   Norval Gable, MD  promethazine (PHENERGAN) 25 MG tablet Take 1 tablet (25 mg total) by mouth every 8 (eight) hours as needed for nausea or vomiting. 02/27/15   Max T Hyatt, DPM  rizatriptan (MAXALT) 10 MG tablet TAKE 1 TABLET BY MOUTH AS NEEDED. MAY REPEAT IN 2 HOURS IF NEEDED 04/04/15   Lucille Passy, MD  venlafaxine XR (EFFEXOR-XR) 75 MG 24 hr capsule Take 2 capsules (150 mg total) by mouth at bedtime. 04/16/15   Lucille Passy, MD   Meds Ordered and Administered this Visit  Medications - No data to display  BP 151/89 mmHg  Pulse 92  Temp(Src) 97.8 F (36.6 C) (Tympanic)  Resp 16  Ht 5' 2.5" (1.588 m)  Wt 185 lb (83.915 kg)  BMI 33.28 kg/m2  SpO2 100% No data found.   Physical Exam  Constitutional: She appears well-developed and well-nourished. No distress.  HENT:  Head: Normocephalic and atraumatic.  Right Ear: Tympanic membrane, external ear and ear canal normal.  Left Ear: Tympanic membrane, external ear and ear canal normal.  Nose: Mucosal edema and rhinorrhea present. No nose lacerations, sinus tenderness, nasal deformity, septal deviation or nasal septal hematoma. No epistaxis.  No foreign bodies. Right sinus exhibits maxillary sinus tenderness and frontal sinus tenderness. Left sinus exhibits maxillary sinus tenderness and frontal sinus tenderness.  Mouth/Throat: Uvula is midline, oropharynx is clear and moist and mucous membranes are normal. No oropharyngeal exudate.  Eyes: Conjunctivae and EOM are normal. Pupils are equal, round, and reactive to light. Right eye exhibits no discharge. Left eye exhibits no discharge. No scleral icterus.  Neck: Normal range of motion. Neck supple. No thyromegaly present.  Cardiovascular: Normal rate, regular rhythm and normal heart sounds.   Pulmonary/Chest: Effort normal and breath sounds normal. No respiratory distress. She has no wheezes. She has no rales.  Lymphadenopathy:     She has no cervical adenopathy.  Skin: Rash noted. She is not diaphoretic.  Erythematous rash with pinpoint vesicular lesions on forearms and upper legs  Nursing note and vitals reviewed.   ED Course  Procedures (including critical care time)  Labs Review Labs Reviewed - No data to display  Imaging Review No results found.   Visual Acuity Review  Right Eye Distance:   Left Eye Distance:   Bilateral Distance:    Right Eye Near:   Left Eye Near:    Bilateral Near:  MDM   1. Acute maxillary sinusitis, recurrence not specified   2. Contact dermatitis due to poison ivy    Discharge Medication List as of 05/13/2015  7:42 PM    START taking these medications   Details  doxycycline (VIBRA-TABS) 100 MG tablet Take 1 tablet (100 mg total) by mouth 2 (two) times daily., Starting 05/13/2015, Until Discontinued, Normal    predniSONE (DELTASONE) 20 MG tablet 3 tabs po qd for 2 days, then 2 tabs po qd for 3 days, then 1 tab po qd for 3 days, then half a tab po qd for 2 days, Normal      1. diagnosis reviewed with patient/parent/guardian/family 2. rx as per orders above; reviewed possible side effects, interactions, risks and benefits  3. Recommend supportive treatment with otc antihistamines prn 4. Follow prn if symptoms worsen or don't improve    Norval Gable, MD 05/13/15 2054

## 2015-05-14 ENCOUNTER — Telehealth: Payer: Self-pay

## 2015-05-14 NOTE — Telephone Encounter (Signed)
PLEASE NOTE: All timestamps contained within this report are represented as [place] Standard Time. CONFIDENTIALTY NOTICE: This fax transmission is intended only for the addressee. It contains information that is legally privileged, confidential or otherwise protected from use or disclosure. If you are not the intended recipient, you are strictly prohibited from reviewing, disclosing, copying using or disseminating any of this information or taking any action in reliance on or regarding this information. If you have received this fax in error, please notify us immediately by telephone so that we can arrange for its return to [place]. Phone: [phone], Toll-Free: [phone], Fax: [phone] Page: 1 of 2 Call Id: [id number] [place] Patient Name: Mandy Torres Gender: Female DOB: 09/20/1970 Age: 44 Y 2 M 9 D Return Phone Number: [phone] (Primary) Address: City/State/Zip: [place] [address] Client [facility] Night - Client Client Site [facility] Physician Arnette Norris Contact Type Call Call Type Triage / Clinical Relationship To Patient Self Return Phone Number [phone] (Primary) Chief Complaint Headache Initial Comment Caller states she has a severe sinus infection, headache, and rt ear pain. She is having brown mucous. She is taking nasal spray and Mucinexsinus. Also has poison Karlene Einstein that has spread and is using some OTC medications. PreDisposition Home Care Nurse Assessment Nurse: Cox, RN, [place] Date/Time (Eastern Time): 05/13/2015 5:20:32 PM Confirm and document reason for call. If symptomatic, describe symptoms. ---Caller states she has a severe sinus infection, headache, and rt ear pain. Has brown mucous. Also has poison Karlene Einstein that has spread and is using some OTC medications. Took prednisone last week for poison ivy- a 6 day  taper. No fever Has the patient traveled out of the country within the last 30 days? ---No Does the patient have any new or worsening symptoms? ---Yes Will a triage be completed? ---Yes Related visit to physician within the last 2 weeks? ---Yes Does the PT have any chronic conditions? (i.e. diabetes, asthma, etc.) ---No Did the patient indicate they were pregnant? ---No Guidelines Guideline Title Affirmed Question Affirmed Notes Nurse Date/Time Eilene Ghazi Time) Sinus Pain or Congestion Earache Cox, RN, [place] 07/18/7937 0:30:09 PM Poison Ivy - Oak - [place] SEVERE itching (e.g., interferes with sleep or normal activities) Cox, Therapist, sports, [place] 23/09/74 2:26:33 PM Disp. Time Eilene Ghazi Time) Disposition Final User 05/13/2015 5:25:54 PM See Physician within 24 Hours Cox, RN, [place] 35/10/5623 6:38:93 PM See Physician within 24 Hours Yes Cox, RN, [place] PLEASE NOTE: All timestamps contained within this report are represented as [place] Standard Time. CONFIDENTIALTY NOTICE: This fax transmission is intended only for the addressee. It contains information that is legally privileged, confidential or otherwise protected from use or disclosure. If you are not the intended recipient, you are strictly prohibited from reviewing, disclosing, copying using or disseminating any of this information or taking any action in reliance on or regarding this information. If you have received this fax in error, please notify us immediately by telephone so that we can arrange for its return to [place]. Phone: [phone], Toll-Free: [phone], Fax: [phone] Page: 2 of 2 Call Id: [id number] Caller Understands: Yes Disagree/Comply: Comply Caller Understands: Yes Disagree/Comply: Comply Care Advice Given Per Guideline SEE PHYSICIAN WITHIN 24 HOURS: * IF OFFICE WILL BE OPEN: You need to be seen within the next 24 hours. Call your doctor when the office opens, and make an appointment. PAIN MEDICINES: * For pain relief,  take acetaminophen, ibuprofen,  or naproxen. LOCAL COLD: Apply a cold pack or ice in a wet washcloth to the outer ear for 20 minutes. (Note: Author's preference: some adults prefer local heat for 20 minutes). FOR A STUFFY NOSE - USE NASAL WASHES: * Introduction: Saline (salt water) nasal irrigation (nasal wash) is an effective and simple home remedy for treating stuffy nose and sinus congestion. The nose can be irrigated by pouring, spraying, or squirting salt water into the nose and then letting it run back out. NASAL DECONGESTANTS FOR A VERY STUFFY NOSE: * If you have a very stuffy nose, nasal decongestant medicines can shrink the swollen nasal mucosa and allow for easier breathing. If you have a very runny nose, these medicines can reduce the amount of drainage. They may be taken as pills by mouth or as a nasal spray. CALL BACK IF: * Difficulty breathing (and not relieved by cleaning out nose) * You become worse. CARE ADVICE given per Sinus Pain or Congestion (Adult) guideline. SEE PHYSICIAN WITHIN 24 HOURS: * IF OFFICE WILL BE OPEN: You need to be seen within the next 24 hours. Call your doctor when the office opens, and make an appointment. HYDROCORTISONE CREAM FOR ITCHING: * Apply hydrocortisone cream 4 times a day for 5 days. LOCAL COLD: Soak the involved area in cool water for 20 minutes or massage it with an ice cube as often as necessary to reduce itching and oozing. ANTIHISTAMINE FOR ITCHING: * Take an antihistamine by mouth to reduce the itching. Benadryl (OTC diphenhydramine) is best. Adult dose is 25-50 mg. Take it up to 4 times a day. AVOID SCRATCHING: Cut the fingernails short and avoid scratching to prevent a secondary infection from bacteria. CALL BACK IF: * You become worse. CARE ADVICE given per [place] (Adult) guideline. After Care Instructions Given Call Event Type User Date / Time Description Referrals REFERRED TO PCP OFFICE REFERRED TO PCP OFFICE

## 2015-05-14 NOTE — Telephone Encounter (Signed)
Pt seen UC on 05/13/15.

## 2015-05-21 ENCOUNTER — Telehealth: Payer: Self-pay | Admitting: Podiatry

## 2015-05-21 NOTE — Telephone Encounter (Signed)
Pt lvm on nurse line that she had sugery on 8.19.16 for left bunion and 2nd toe the past week has been cramping and now swelling after massaging. Pt has been on steriods for poison ivey and antibiotic for sinuses.

## 2015-05-22 ENCOUNTER — Ambulatory Visit (INDEPENDENT_AMBULATORY_CARE_PROVIDER_SITE_OTHER): Payer: 59

## 2015-05-22 ENCOUNTER — Ambulatory Visit (INDEPENDENT_AMBULATORY_CARE_PROVIDER_SITE_OTHER): Payer: 59 | Admitting: Podiatry

## 2015-05-22 DIAGNOSIS — Z9889 Other specified postprocedural states: Secondary | ICD-10-CM

## 2015-05-22 DIAGNOSIS — M2012 Hallux valgus (acquired), left foot: Secondary | ICD-10-CM

## 2015-05-22 NOTE — Progress Notes (Signed)
She presents today for her final postop visit regarding her [facility] bunion repair and second metatarsal osteotomy left foot. She states that she was doing very well until just recently where she had severe pain to her foot and could not walk on it. She denies any trauma to the foot at that time. Date of surgery 02/28/2015.  Objective: Vital signs are stable she is alert and oriented 3. She has great range of motion of the first and second metatarsophalangeal joints of the left foot. Incisions along healed very nicely radiographs 3 views left foot confirm well-healed osteotomies left. No signs of infection no signs of trauma.  Assessment: Well-healing surgical foot  Plan: Discussed etiology pathology conservative versus surgical therapies I will follow-up with her as needed.

## 2015-05-28 ENCOUNTER — Encounter: Payer: 59 | Admitting: Podiatry

## 2015-05-30 ENCOUNTER — Ambulatory Visit
Admission: EM | Admit: 2015-05-30 | Discharge: 2015-05-30 | Disposition: A | Discharge status: Home / Self Care | Payer: 59 | Attending: Family Medicine | Admitting: Family Medicine

## 2015-05-30 ENCOUNTER — Telehealth: Payer: Self-pay | Admitting: Family Medicine

## 2015-05-30 DIAGNOSIS — R21 Rash and other nonspecific skin eruption: Secondary | ICD-10-CM

## 2015-05-30 MED ORDER — METHYLPREDNISOLONE SODIUM SUCC 125 MG IJ SOLR: 62.5000 mg | Freq: Once | INTRAMUSCULAR | Status: DC

## 2015-05-30 NOTE — Telephone Encounter (Signed)
I did not see her for this. She will need a follow up appt. Can we put her on Saturday clinic schedule?

## 2015-05-30 NOTE — Telephone Encounter (Signed)
[facility] Patient Name: Mandy Torres DOB: 10/19/1970 Initial Comment Caller States she had poison ivy and has been treated for it, but not it seems it has or something has spread to her face. Nurse Assessment Nurse: Martyn Ehrich, RN, [place] Date/Time (Eastern Time): 05/30/2015 3:43:11 PM Confirm and document reason for call. If symptomatic, describe symptoms. ---PT has been treated for poison ivey. (from weeds around her house). She went into my chart and they called in perscription - steroid with 6 d taper. 3 d after that it came back and she had sinus infection and she went to [facility] and was on 8 day steroid taper. She finished it last week end and it started spreading again and is on her face. The UC MD did not disagree when she called it poison ivey - now she is wondering. She did have blisters on R arm. Most is on R of body. Whole back is itching.Has the patient traveled out of the country within the last 30 days? ---No Does the patient have any new or worsening symptoms? ---Yes Will a triage be completed? ---Yes Related visit to physician within the last 2 weeks? ---Yes Does the PT have any chronic conditions? (i.e. diabetes, asthma, etc.) ---Yes List chronic conditions. ---being tx for HTN Did the patient indicate they were pregnant? ---No Is this a behavioral health call? ---No Guidelines Guideline Title Affirmed Question Affirmed Notes Poison Ivy - [place] [1] Severe poison ivy, oak, or sumac reaction in the past AND [2] face involvedFinal Disposition User See Physician within 4 Hours (or PCP triage) Martyn Ehrich, RN, Felicia Referrals GO TO [place] Disagree/Comply: Comply Call Id: [id number]

## 2015-05-30 NOTE — ED Notes (Signed)
Seen here 2 weeks ago for rash and sinusitis. Were on Prednisone and when stopped, rash returning. Last night rash on face. Itchy not painful

## 2015-05-30 NOTE — Telephone Encounter (Signed)
Spoke with pt and she is on her way to UC in [place] on [address].

## 2015-05-30 NOTE — ED Provider Notes (Signed)
Patient presents today with history of a persistent rash which she relates to poison ivy. She states that she has been on 2 doses of prednisone. She continues to have a rash that is itchy. Currently the rash is on her lower extremities, upper extremities and face. She is unsure of the allergen at this point. She denies any shortness of breath, edema, chest pain, fever. She is currently not on any oral prednisone at this time and does not want to take any more. She is taking a daily antihistamine. She denies any new medications recently. She does have an appointment with ENT on Monday to discuss sinusitis issues.  ROS: Negative except mentioned above.  Vitals as per Epic.  GENERAL: NAD HEENT: patient has had surgery on her face so she does have some chronic asymmetry, no pharyngeal erythema, no exudate, no erythema of TMs, no tongue, lip, or ear swelling RESP: CTA B CARD: RRR SKIN: small erythematous rash on the face and right lower extremity, the rash does not appear to be vesicular in nature. There is no rash noted in the webspaces of the fingers. No signs of secondary bacterial infection.  A/P: Skin rash-patient agrees to have Solumedrol 62.5 mg IM in the office today to help with the itchiness of the rash, she declines any further oral steroids, she does plan on seeing ENT next week, I have asked that she talked to them about allergy testing, she is to continue her oral daily antihistamine at this point. If any worsening symptoms she is to seek medical attention as directed.     Paulina Fusi, MD 05/30/15 2033

## 2015-06-18 ENCOUNTER — Encounter: Payer: Self-pay | Admitting: Family Medicine

## 2015-07-02 ENCOUNTER — Encounter: Payer: Self-pay | Admitting: Family Medicine

## 2015-07-02 ENCOUNTER — Ambulatory Visit (INDEPENDENT_AMBULATORY_CARE_PROVIDER_SITE_OTHER): Payer: 59 | Admitting: Family Medicine

## 2015-07-02 VITALS — BP 120/80 | HR 73 | Temp 98.1°F | Wt 186.0 lb

## 2015-07-02 DIAGNOSIS — E669 Obesity, unspecified: Secondary | ICD-10-CM

## 2015-07-02 NOTE — Progress Notes (Signed)
Pre visit review using our clinic review tool, if applicable. No additional management support is needed unless otherwise documented below in the visit note. 

## 2015-07-02 NOTE — Patient Instructions (Signed)
My fitnesspal  Great to see you.  Happy Holidays!

## 2015-07-02 NOTE — Progress Notes (Signed)
Subjective:   Patient ID: Mandy Torres, female    DOB: [birth year], 44 y.o.   MRN: [id number]  Mandy Torres is a pleasant 44 y.o. year old female who presents to clinic today with Follow-up  on 07/02/2015  HPI:  Wants to talk about losing weight.  Interested in diet pills.  Has been seeing Dr. Nicolasa Ducking, psychiatry, rxs recently changed.  She hopes this will help. Has been to a nutritonist twice.  Place on attending wellness classes through [place].  Made an appointment to see an endocrinologist Dr. Buddy Duty in January because she really feels like something is wrong with her thyroid function as she cannot seem to lose weight.  Lab Results  Component Value Date   TSH 4.40 04/11/2015   Not exercising regularly.  Wt Readings from Last 3 Encounters:  07/02/15 186 lb (84.369 kg)  05/30/15 186 lb (84.369 kg)  05/13/15 185 lb (83.915 kg)   Current Outpatient Prescriptions on File Prior to Visit  Medication Sig Dispense Refill  . Azelastine HCl 0.15 % SOLN 2 sprays.     . Calcium Carbonate Antacid (TUMS E-X PO) Take 2 tablets by mouth as needed.    . chlorpheniramine (CHLOR-TRIMETON) 4 MG tablet Take 4 mg by mouth every 6 (six) hours as needed for allergies.    . hydrochlorothiazide (MICROZIDE) 12.5 MG capsule TAKE 1 CAPSULE (12.5 MG TOTAL) BY MOUTH DAILY. 90 capsule 3  . lansoprazole (PREVACID) 15 MG capsule Take 1 capsule (15 mg total) by mouth daily at 12 noon. 30 capsule 6  . methocarbamol (ROBAXIN) 750 MG tablet TAKE 1 TABLET BY MOUTH 4 TIMES DAILY (Patient taking differently: prn) 120 tablet 3  . rizatriptan (MAXALT) 10 MG tablet TAKE 1 TABLET BY MOUTH AS NEEDED. MAY REPEAT IN 2 HOURS IF NEEDED 9 tablet 3   No current facility-administered medications on file prior to visit.    Allergies  Allergen Reactions  . Avelox [Moxifloxacin Hcl In Nacl] Hives  . Endocet [Oxycodone-Acetaminophen] Nausea Only  . Levofloxacin Nausea And Vomiting  . Penicillins Hives and Nausea  Only  . Percocet [Oxycodone-Acetaminophen] Nausea And Vomiting    Also Endocet  . Ranitidine Nausea And Vomiting  . Sulfa Antibiotics Hives  . Topamax     congnitive impairmant  . Valium     Hyperactivity     Past Medical History  Diagnosis Date  . History of breast biopsy 05/2001  . Allergy   . ADHD (attention deficit hyperactivity disorder)     [name]  . Depression   . DJD (degenerative joint disease), cervical     neurosurg - Dr. Wendall Stade at [facility]  . Migraine   . Hiatal hernia   . Foot neuroma   . TMJ disease     Past Surgical History  Procedure Laterality Date  . Cholecystectomy    . Knee surgery  11/97 and 4/93    right  . Oophorectomy    . Appendectomy    . Nasal sinus surgery  2006 and 1998    deviated septum, ethmoids, Dr. Caryn Section then Richardson Landry  . Facial reconstruction surgery  2/88, 9/88, 2/89  . Breast surgery      normal biopsy 2002, left    Family History  Problem Relation Age of Onset  . Cancer Mother     Breast  . Diabetes Mother   . Thyroid disease Mother   . Cancer Sister     Half sister, Breast  . Cancer Maternal Aunt  Breast  . Diabetes Maternal Aunt   . Thyroid disease Maternal Aunt   . Heart disease Maternal Grandmother     Social History   Social History  . Marital Status: Single    Spouse Name: N/A  . Number of Children: N/A  . Years of Education: N/A   Occupational History  . Not on file.   Social History Main Topics  . Smoking status: Never Smoker   . Smokeless tobacco: Never Used  . Alcohol Use: Yes     Comment: social  . Drug Use: No  . Sexual Activity: Yes   Other Topics Concern  . Not on file   Social History Narrative   Single   0 children   0 pregnancies    First menstrual cycle: 44 yrs old         The PMH, PSH, Social History, Family History, Medications, and allergies have been reviewed in [facility], and have been updated if relevant.   Review of Systems  Endocrine: Negative.     Psychiatric/Behavioral: Positive for dysphoric mood. Negative for suicidal ideas, sleep disturbance and self-injury. The patient is not nervous/anxious.   All other systems reviewed and are negative.      Objective:    BP 120/80 mmHg  Pulse 73  Temp(Src) 98.1 F (36.7 C) (Tympanic)  Wt 186 lb (84.369 kg)  SpO2 98%   Physical Exam  Constitutional: She is oriented to person, place, and time. She appears well-developed and well-nourished. No distress.  HENT:  Head: Normocephalic.  Eyes: Conjunctivae are normal.  Cardiovascular: Normal rate.   Pulmonary/Chest: Effort normal.  Musculoskeletal: Normal range of motion.  Neurological: She is alert and oriented to person, place, and time. No cranial nerve deficit.  Skin: Skin is warm and dry. She is not diaphoretic.  Psychiatric: She has a normal mood and affect. Her behavior is normal. Judgment and thought content normal.  Nursing note and vitals reviewed.         Assessment & Plan:   Obesity, unspecified No Follow-up on file.

## 2015-07-02 NOTE — Assessment & Plan Note (Signed)
>  25 minutes spent in face to face time with patient, >50% spent in counselling or coordination of care Advised against stimulants for weight loss as she has had adverse reactions to stimulants in past.  Discussed tracking calories and an exercise plan.  Myfitness pal is a good tool she can try. The patient indicates understanding of these issues and agrees with the plan.

## 2015-07-13 DIAGNOSIS — E079 Disorder of thyroid, unspecified: Secondary | ICD-10-CM

## 2015-07-13 HISTORY — DX: Disorder of thyroid, unspecified: E07.9

## 2015-07-21 DIAGNOSIS — F39 Unspecified mood [affective] disorder: Secondary | ICD-10-CM | POA: Diagnosis not present

## 2015-08-08 ENCOUNTER — Other Ambulatory Visit: Payer: Self-pay | Admitting: Family Medicine

## 2015-08-15 DIAGNOSIS — F39 Unspecified mood [affective] disorder: Secondary | ICD-10-CM | POA: Diagnosis not present

## 2015-08-20 DIAGNOSIS — F39 Unspecified mood [affective] disorder: Secondary | ICD-10-CM | POA: Diagnosis not present

## 2015-08-26 ENCOUNTER — Other Ambulatory Visit: Payer: Self-pay | Admitting: Family Medicine

## 2015-09-16 DIAGNOSIS — R5383 Other fatigue: Secondary | ICD-10-CM | POA: Diagnosis not present

## 2015-09-16 DIAGNOSIS — K5900 Constipation, unspecified: Secondary | ICD-10-CM | POA: Diagnosis not present

## 2015-09-16 DIAGNOSIS — Z8349 Family history of other endocrine, nutritional and metabolic diseases: Secondary | ICD-10-CM | POA: Diagnosis not present

## 2015-09-16 DIAGNOSIS — R6889 Other general symptoms and signs: Secondary | ICD-10-CM | POA: Diagnosis not present

## 2015-09-16 DIAGNOSIS — E063 Autoimmune thyroiditis: Secondary | ICD-10-CM | POA: Diagnosis not present

## 2015-09-17 DIAGNOSIS — F39 Unspecified mood [affective] disorder: Secondary | ICD-10-CM | POA: Diagnosis not present

## 2015-09-19 DIAGNOSIS — F39 Unspecified mood [affective] disorder: Secondary | ICD-10-CM | POA: Diagnosis not present

## 2015-09-25 ENCOUNTER — Telehealth: Payer: Self-pay | Admitting: Family Medicine

## 2015-09-25 NOTE — Telephone Encounter (Signed)
Also-Mandy Torres, pt would like call back with decision on what can be done. If before 1pm today please call pt cell [phone]. If after 1pm please call her work at [phone]

## 2015-09-25 NOTE — Telephone Encounter (Signed)
Spoke to pt and scheduled with Dr Diona Browner

## 2015-09-25 NOTE — Telephone Encounter (Signed)
I would prefer it if she made an appt to be seen

## 2015-09-25 NOTE — Telephone Encounter (Signed)
Pt called. Would like to know if Dr. Deborra Medina can call in a yeast infection medication. She has had it about 7 days now and just finished 3 day course of Monastat. She would like something called in to [place] at [facility]. I advised pt we typically do not send in medications with out being seen, but I would ask.  Routing to [facility] as Dr. Deborra Medina is out sick.

## 2015-09-26 ENCOUNTER — Ambulatory Visit (INDEPENDENT_AMBULATORY_CARE_PROVIDER_SITE_OTHER): Payer: 59 | Admitting: Family Medicine

## 2015-09-26 ENCOUNTER — Encounter: Payer: Self-pay | Admitting: Family Medicine

## 2015-09-26 VITALS — BP 118/76 | HR 90 | Temp 98.3°F | Ht 62.5 in | Wt 179.0 lb

## 2015-09-26 DIAGNOSIS — N760 Acute vaginitis: Secondary | ICD-10-CM | POA: Insufficient documentation

## 2015-09-26 DIAGNOSIS — F39 Unspecified mood [affective] disorder: Secondary | ICD-10-CM | POA: Diagnosis not present

## 2015-09-26 LAB — POCT WET PREP (WET MOUNT)

## 2015-09-26 MED ORDER — METRONIDAZOLE 500 MG PO TABS: 500.0000 mg | ORAL_TABLET | Freq: Two times a day (BID) | ORAL | Status: DC

## 2015-09-26 NOTE — Progress Notes (Signed)
Pre visit review using our clinic review tool, if applicable. No additional management support is needed unless otherwise documented below in the visit note. 

## 2015-09-26 NOTE — Patient Instructions (Addendum)
Start course of flagyl for bacterial vaginosis.  No sign of yeast infection.

## 2015-09-26 NOTE — Assessment & Plan Note (Signed)
Hx of recurrent yeast infections but no yeast on wet prep today, few clue cells.. Will treat for BV. Pt to call if not improving as expected.

## 2015-09-26 NOTE — Progress Notes (Signed)
Subjective:    Patient ID: Mandy Torres, female    DOB: March 04, 1971, 45 y.o.   MRN: [id number]  Vaginal Discharge The patient's primary symptoms include genital itching and vaginal discharge. The patient's pertinent negatives include no pelvic pain. Primary symptoms comment: more itching and burning than discharge. This is a new problem. The current episode started in the past 7 days. The problem occurs constantly. The patient is experiencing no pain. She is not pregnant. Associated symptoms include discolored urine. Pertinent negatives include no abdominal pain, back pain, constipation, dysuria, fever, flank pain, frequency, headaches, hematuria, nausea or vomiting. The vaginal discharge was clear. There has been no bleeding. Treatments tried: monistat x 3 days, using vinegar to wash. The treatment provided no relief. She is sexually active. No, her partner does not have an STD. She uses nothing for contraception. Her past medical history is significant for vaginosis. There is no history of miscarriage or an STD. (Hx of recurrnet vaginitis, yeast in past.)    No recent antibiotics.   Social History /Family History/Past Medical History reviewed and updated if needed.   Review of Systems  Constitutional: Negative for fever.  Gastrointestinal: Negative for nausea, vomiting, abdominal pain and constipation.  Genitourinary: Positive for vaginal discharge. Negative for dysuria, frequency, hematuria, flank pain and pelvic pain.  Musculoskeletal: Negative for back pain.  Neurological: Negative for headaches.       Objective:   Physical Exam  Constitutional: Vital signs are normal. She appears well-developed and well-nourished. She is cooperative.  Non-toxic appearance. She does not appear ill. No distress.  HENT:  Head: Normocephalic.  Right Ear: Hearing, tympanic membrane, external ear and ear canal normal. Tympanic membrane is not erythematous, not retracted and not bulging.  Left Ear:  Hearing, tympanic membrane, external ear and ear canal normal. Tympanic membrane is not erythematous, not retracted and not bulging.  Nose: No mucosal edema or rhinorrhea. Right sinus exhibits no maxillary sinus tenderness and no frontal sinus tenderness. Left sinus exhibits no maxillary sinus tenderness and no frontal sinus tenderness.  Mouth/Throat: Uvula is midline, oropharynx is clear and moist and mucous membranes are normal.  Eyes: Conjunctivae, EOM and lids are normal. Pupils are equal, round, and reactive to light. Lids are everted and swept, no foreign bodies found.  Neck: Trachea normal and normal range of motion. Neck supple. Carotid bruit is not present. No thyroid mass and no thyromegaly present.  Cardiovascular: Normal rate, regular rhythm, S1 normal, S2 normal, normal heart sounds, intact distal pulses and normal pulses.  Exam reveals no gallop and no friction rub.   No murmur heard. Pulmonary/Chest: Effort normal and breath sounds normal. No tachypnea. No respiratory distress. She has no decreased breath sounds. She has no wheezes. She has no rhonchi. She has no rales.  Abdominal: Soft. Normal appearance and bowel sounds are normal. There is no tenderness.  Genitourinary: There is no rash, tenderness or lesion on the right labia. There is tenderness on the left labia. There is no rash or lesion on the left labia. There is erythema and tenderness in the vagina. No signs of injury around the vagina. No vaginal discharge found.  Neurological: She is alert.  Skin: Skin is warm, dry and intact. No rash noted.  Psychiatric: Her speech is normal and behavior is normal. Judgment and thought content normal. Her mood appears not anxious. Cognition and memory are normal. She does not exhibit a depressed mood.          Assessment &  Plan:

## 2015-09-30 DIAGNOSIS — F39 Unspecified mood [affective] disorder: Secondary | ICD-10-CM | POA: Diagnosis not present

## 2015-10-09 ENCOUNTER — Encounter: Payer: Self-pay | Admitting: Family Medicine

## 2015-10-14 DIAGNOSIS — F39 Unspecified mood [affective] disorder: Secondary | ICD-10-CM | POA: Diagnosis not present

## 2015-10-15 ENCOUNTER — Encounter: Payer: Self-pay | Admitting: Family Medicine

## 2015-10-15 ENCOUNTER — Ambulatory Visit (INDEPENDENT_AMBULATORY_CARE_PROVIDER_SITE_OTHER): Payer: 59 | Admitting: Family Medicine

## 2015-10-15 VITALS — BP 118/84 | HR 90 | Temp 97.5°F | Wt 176.0 lb

## 2015-10-15 DIAGNOSIS — N760 Acute vaginitis: Secondary | ICD-10-CM

## 2015-10-15 NOTE — Progress Notes (Signed)
SUBJECTIVE:  45 y.o. female complains of clear, thin and vaginal erythema noted vaginal discharge for 5 day(s). Denies abnormal vaginal bleeding or significant pelvic pain or fever. No UTI symptoms. Denies history of known exposure to STD.  Saw Dr. Diona Browner on 09/26/15 for similar symptoms.  Note reviewed. Treated for BV with Flagyl as she had a few clue cells on wet prep. Symptoms resolved but would like to be recheck today to make sure infection has resolved.  No LMP recorded. Patient is not currently having periods (Reason: Oral contraceptives).  Current Outpatient Prescriptions on File Prior to Visit  Medication Sig Dispense Refill  . Azelastine HCl 0.15 % SOLN 2 sprays.     . Biotin 1000 MCG tablet Take 1,000 mcg by mouth daily.    . Calcium Carbonate Antacid (TUMS E-X PO) Take 2 tablets by mouth as needed.    . divalproex (DEPAKOTE ER) 500 MG 24 hr tablet Take 500 mg by mouth at bedtime.  0  . hydrochlorothiazide (MICROZIDE) 12.5 MG capsule TAKE 1 CAPSULE (12.5 MG TOTAL) BY MOUTH DAILY. 90 capsule 3  . lansoprazole (PREVACID) 15 MG capsule Take 30 mg by mouth daily at 12 noon.    . methocarbamol (ROBAXIN) 750 MG tablet TAKE 1 TABLET BY MOUTH 4 TIMES DAILY 120 tablet 3  . norethindrone-ethinyl estradiol (MICROGESTIN,JUNEL,LOESTRIN) 1-20 MG-MCG tablet TAKE 1 TABLET BY MOUTH DAILY. 21 tablet 5  . Probiotic Product (TRUBIOTICS PO) Take by mouth.    . pyridOXINE (VITAMIN B-6) 100 MG tablet Take 100 mg by mouth daily.    . rizatriptan (MAXALT) 10 MG tablet TAKE 1 TABLET BY MOUTH AS NEEDED. MAY REPEAT IN 2 HOURS IF NEEDED 9 tablet 3  . SYNTHROID 25 MCG tablet Take 25 mcg by mouth daily before breakfast.  11  . triamcinolone (NASACORT ALLERGY 24HR) 55 MCG/ACT AERO nasal inhaler Place 2 sprays into the nose daily.     No current facility-administered medications on file prior to visit.    Allergies  Allergen Reactions  . Avelox [Moxifloxacin Hcl In Nacl] Hives  . Endocet  [Oxycodone-Acetaminophen] Nausea Only  . Levofloxacin Nausea And Vomiting  . Penicillins Hives and Nausea Only  . Percocet [Oxycodone-Acetaminophen] Nausea And Vomiting    Also Endocet  . Ranitidine Nausea And Vomiting  . Sulfa Antibiotics Hives  . Topamax     congnitive impairmant  . Valium     Hyperactivity     Past Medical History  Diagnosis Date  . History of breast biopsy 05/2001  . Allergy   . ADHD (attention deficit hyperactivity disorder)     Mandy Torres  . Depression   . DJD (degenerative joint disease), cervical     neurosurg - Dr. Wendall Stade at [facility]  . Migraine   . Hiatal hernia   . Foot neuroma   . TMJ disease     Past Surgical History  Procedure Laterality Date  . Cholecystectomy    . Knee surgery  11/97 and 4/93    right  . Oophorectomy    . Appendectomy    . Nasal sinus surgery  2006 and 1998    deviated septum, ethmoids, Dr. Caryn Section then Richardson Landry  . Facial reconstruction surgery  2/88, 9/88, 2/89  . Breast surgery      normal biopsy 2002, left    Family History  Problem Relation Age of Onset  . Cancer Mother     Breast  . Diabetes Mother   . Thyroid disease Mother   . Cancer  Sister     59 sister, Breast  . Cancer Maternal Aunt     Breast  . Diabetes Maternal Aunt   . Thyroid disease Maternal Aunt   . Heart disease Maternal Grandmother     Social History   Social History  . Marital Status: Single    Spouse Name: N/A  . Number of Children: N/A  . Years of Education: N/A   Occupational History  . Not on file.   Social History Main Topics  . Smoking status: Never Smoker   . Smokeless tobacco: Never Used  . Alcohol Use: Yes     Comment: social  . Drug Use: No  . Sexual Activity: Yes   Other Topics Concern  . Not on file   Social History Narrative   Single   0 children   0 pregnancies    First menstrual cycle: 45 yrs old         The PMH, PSH, Social History, Family History, Medications, and allergies have been reviewed in  [facility], and have been updated if relevant.  OBJECTIVE:  BP 118/84 mmHg  Pulse 90  Temp(Src) 97.5 F (36.4 C) (Oral)  Wt 176 lb (79.833 kg)  SpO2 99%  She appears well, afebrile. Abdomen: benign, soft, nontender, no masses. Pelvic Exam: normal external genitalia, vulva, vagina, cervix, uterus and adnexa, WET MOUNT done - results: negative for pathogens, normal epithelial cells. Urine dipstick: not done.  ASSESSMENT:  bacterial vaginosis- resolved  PLAN:  Reassurance provided. No further rx indicated. ROV prn if symptoms persist or worsen.

## 2015-10-15 NOTE — Progress Notes (Signed)
Pre visit review using our clinic review tool, if applicable. No additional management support is needed unless otherwise documented below in the visit note. 

## 2015-10-22 ENCOUNTER — Telehealth: Payer: Self-pay | Admitting: Family Medicine

## 2015-10-22 ENCOUNTER — Telehealth: Payer: Self-pay

## 2015-10-22 DIAGNOSIS — R300 Dysuria: Secondary | ICD-10-CM

## 2015-10-22 NOTE — Telephone Encounter (Signed)
I am not sure how to send that order.  Will route to Tasha in the lab.

## 2015-10-22 NOTE — Telephone Encounter (Signed)
Pt left v/m; pt was seen 10/15/15; pt continues with perineal irritation and burning and symptoms have intensified. Pt has some burning upon urination and frequency. 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 Pt request stat order for urinalysis sent to [facility] on [address] [facility]. Pt request cb when order is sent to [facility].

## 2015-10-22 NOTE — Telephone Encounter (Signed)
Mandy Torres @ [facility] called she has questions about the urinalysis dr Deborra Medina order Pt told wanda this was stat and she wanted to clarify this Please call wanda asap

## 2015-10-22 NOTE — Telephone Encounter (Signed)
Spoke to [facility] Mariann Laster was on another call) and advised order is not stat.

## 2015-10-22 NOTE — Telephone Encounter (Signed)
I put in stat orders, and notified pt. I informed her that we will her with the results when we receive them from the lab. She says thank you!

## 2015-10-23 ENCOUNTER — Telehealth: Payer: Self-pay | Admitting: Family Medicine

## 2015-10-23 ENCOUNTER — Other Ambulatory Visit: Payer: Self-pay | Admitting: Family Medicine

## 2015-10-23 LAB — URINALYSIS
Bilirubin, UA: NEGATIVE
Glucose, UA: NEGATIVE
Ketones, UA: NEGATIVE
NITRITE UA: NEGATIVE
PH UA: 6.5 (ref 5.0–7.5)
PROTEIN UA: NEGATIVE
RBC UA: NEGATIVE
Specific Gravity, UA: 1.01 (ref 1.005–1.030)
Urobilinogen, Ur: 0.2 mg/dL (ref 0.2–1.0)

## 2015-10-23 MED ORDER — NITROFURANTOIN MONOHYD MACRO 100 MG PO CAPS: 100.0000 mg | ORAL_CAPSULE | Freq: Two times a day (BID) | ORAL | Status: DC

## 2015-10-23 NOTE — Telephone Encounter (Signed)
eRx sent

## 2015-10-23 NOTE — Telephone Encounter (Signed)
Patient called back and said she is still having burning and irritation.  Patient said Macrobid isn't on her allergy list.  Please call RX to [facility]  [phone].

## 2015-10-23 NOTE — Telephone Encounter (Signed)
Spoke to patient. Advised her to give the pharmacy at least an hour to get the electronic rx before calling us back if they do not have it.

## 2015-10-31 DIAGNOSIS — E063 Autoimmune thyroiditis: Secondary | ICD-10-CM | POA: Diagnosis not present

## 2015-11-05 DIAGNOSIS — F39 Unspecified mood [affective] disorder: Secondary | ICD-10-CM | POA: Diagnosis not present

## 2015-11-13 ENCOUNTER — Ambulatory Visit (INDEPENDENT_AMBULATORY_CARE_PROVIDER_SITE_OTHER): Payer: 59 | Admitting: General Surgery

## 2015-11-13 ENCOUNTER — Encounter: Payer: Self-pay | Admitting: General Surgery

## 2015-11-13 VITALS — BP 128/74 | HR 98 | Resp 12 | Ht 62.0 in | Wt 182.0 lb

## 2015-11-13 DIAGNOSIS — K449 Diaphragmatic hernia without obstruction or gangrene: Secondary | ICD-10-CM

## 2015-11-13 DIAGNOSIS — Z1283 Encounter for screening for malignant neoplasm of skin: Secondary | ICD-10-CM | POA: Diagnosis not present

## 2015-11-13 DIAGNOSIS — L700 Acne vulgaris: Secondary | ICD-10-CM | POA: Diagnosis not present

## 2015-11-13 DIAGNOSIS — L218 Other seborrheic dermatitis: Secondary | ICD-10-CM | POA: Diagnosis not present

## 2015-11-13 DIAGNOSIS — E559 Vitamin D deficiency, unspecified: Secondary | ICD-10-CM | POA: Diagnosis not present

## 2015-11-13 DIAGNOSIS — D485 Neoplasm of uncertain behavior of skin: Secondary | ICD-10-CM | POA: Diagnosis not present

## 2015-11-13 NOTE — Progress Notes (Signed)
Patient ID: Mandy Torres, female   DOB: 1970-08-31, 45 y.o.   MRN: [id number]  Chief Complaint  Patient presents with  . Other    GERD    HPI Mandy Torres is a 45 y.o. female here today for an evalution of her acid reflux. She states she has been experiencing a lot of bloating and numerous episodes of reflux throughout the day. She has been taking about 6-10 Tums throughout the day which helps some. She has been eating different foods to help with the reflux. She takes Prevacid OTC 3 times daily and occasionally additional doses without significant improvement.  This has been going on for about 6 months and getting worse, describes the pain as a "flame" coming up her throat.   The patient reports that if she bends over as to time her shoes within a few hours of being food refluxes into her mouth.  The patient has had difficulty with reflux dating back until at least 2003 at that time an upper GI series showed evidence of a patulous lower esophageal sphincter with free reflux.  She became relatively asymptomatic on a PPI and when necessary antacids until early 2011. At that time a barium swallow was reported as normal, but her upper endoscopy showed evidence of grade 1 esophagitis, biopsy showing changes consistent with reflux esophagitis at 37 and 39 cm.   The patient reports no new stress events in her life.  I personally reviewed the patient's history. HPI  Past Medical History  Diagnosis Date  . History of breast biopsy 05/2001  . Allergy   . ADHD (attention deficit hyperactivity disorder)     [name]  . Depression   . DJD (degenerative joint disease), cervical     neurosurg - Dr. Wendall Stade at [facility]  . Migraine   . Hiatal hernia   . Foot neuroma   . TMJ disease   . Thyroid disease 2017    Past Surgical History  Procedure Laterality Date  . Cholecystectomy    . Knee surgery  11/97 and 4/93    right  . Oophorectomy    . Appendectomy    . Nasal sinus surgery   2006 and 1998    deviated septum, ethmoids, Dr. Caryn Section then Richardson Landry  . Facial reconstruction surgery  2/88, 9/88, 2/89  . Breast surgery      normal biopsy 2002, left  . Metatarsal osteotomy with bunionectomy  2016    Family History  Problem Relation Age of Onset  . Cancer Mother     Breast  . Diabetes Mother   . Thyroid disease Mother   . Cancer Sister     Half sister, Breast  . Cancer Maternal Aunt     Breast  . Diabetes Maternal Aunt   . Thyroid disease Maternal Aunt   . Heart disease Maternal Grandmother     Social History Social History  Substance Use Topics  . Smoking status: Never Smoker   . Smokeless tobacco: Never Used  . Alcohol Use: Yes     Comment: social    Allergies  Allergen Reactions  . Avelox [Moxifloxacin Hcl In Nacl] Hives  . Endocet [Oxycodone-Acetaminophen] Nausea Only  . Levofloxacin Nausea And Vomiting  . Penicillins Hives and Nausea Only  . Percocet [Oxycodone-Acetaminophen] Nausea And Vomiting    Also Endocet  . Ranitidine Nausea And Vomiting  . Sulfa Antibiotics Hives  . Topamax     congnitive impairmant  . Valium     Hyperactivity  Current Outpatient Prescriptions  Medication Sig Dispense Refill  . Azelastine HCl 0.15 % SOLN 2 sprays.     . Biotin 1000 MCG tablet Take 1,000 mcg by mouth daily.    . Calcium Carbonate Antacid (TUMS E-X PO) Take 2 tablets by mouth as needed.    . divalproex (DEPAKOTE ER) 500 MG 24 hr tablet Take 500 mg by mouth at bedtime.  0  . hydrochlorothiazide (MICROZIDE) 12.5 MG capsule TAKE 1 CAPSULE (12.5 MG TOTAL) BY MOUTH DAILY. 90 capsule 3  . lansoprazole (PREVACID) 15 MG capsule Take 30 mg by mouth daily at 12 noon.    . loratadine (CLARITIN) 10 MG tablet Take 10 mg by mouth 2 (two) times daily.    . Menthol, Topical Analgesic, (BIOFREEZE ROLL-ON) 4 % GEL Apply topically.    . methocarbamol (ROBAXIN) 750 MG tablet TAKE 1 TABLET BY MOUTH 4 TIMES DAILY 120 tablet 3  . NON FORMULARY 2 (two) times daily.     . norethindrone-ethinyl estradiol (MICROGESTIN,JUNEL,LOESTRIN) 1-20 MG-MCG tablet TAKE 1 TABLET BY MOUTH DAILY. 21 tablet 5  . Probiotic Product (TRUBIOTICS PO) Take by mouth.    . pyridOXINE (VITAMIN B-6) 100 MG tablet Take 100 mg by mouth daily.    . rizatriptan (MAXALT) 10 MG tablet TAKE 1 TABLET BY MOUTH AS NEEDED. MAY REPEAT IN 2 HOURS IF NEEDED 9 tablet 3  . SYNTHROID 25 MCG tablet Take 25 mcg by mouth daily before breakfast.  11  . triamcinolone (NASACORT ALLERGY 24HR) 55 MCG/ACT AERO nasal inhaler Place 2 sprays into the nose daily.     No current facility-administered medications for this visit.    Review of Systems Review of Systems  Constitutional: Negative.   Respiratory: Negative.   Cardiovascular: Negative.   Gastrointestinal: Positive for nausea and vomiting (at times).    Blood pressure 128/74, pulse 98, resp. rate 12, height 5\' 2"  (1.575 m), weight 182 lb (82.555 kg). The patient's weight was 178 pounds in May 2016. Physical Exam Physical Exam  Constitutional: She is oriented to person, place, and time. She appears well-developed and well-nourished.  Eyes: Conjunctivae are normal. No scleral icterus.  Neck: Neck supple.  Cardiovascular: Normal rate, regular rhythm and normal heart sounds.   Pulmonary/Chest: Effort normal and breath sounds normal.  Abdominal: Soft. Bowel sounds are normal.  Musculoskeletal:       Right ankle: She exhibits swelling.       Left ankle: She exhibits swelling.  Lymphadenopathy:    She has no cervical adenopathy.  Neurological: She is alert and oriented to person, place, and time.  Skin: Skin is warm and dry.    Data Reviewed 2003 and 2011. Study reports.  2011 upper endoscopy showing evidence of distal esophagitis.   Assessment    Esophagitis, likely secondary to acid neutral reflux.  Past documentation of a patulous LES and esophagitis (2003, 2011) sees.    Plan    We will obtain a barium swallow to confirm no change  in her anatomy or Motrin only, but I expect that she will benefit from fundoplication. Upper endoscopy can be completed if this is necessary on Dr. Bernette Mayers review of the case.     Would benefit to have hiatal hernia repaired recommended Dr. Duke Salvia for repair.  This information has been scribed by Verlene Mayer, CMA   PCP: Dr. Arnette Norris  Robert Bellow 11/14/2015, 1:11 PM

## 2015-11-13 NOTE — Patient Instructions (Signed)
Would benefit to have hiatal hernia repaired recommended Dr. Duke Salvia for repair. Will review records and follow up.

## 2015-11-14 DIAGNOSIS — K449 Diaphragmatic hernia without obstruction or gangrene: Secondary | ICD-10-CM | POA: Insufficient documentation

## 2015-11-17 ENCOUNTER — Telehealth: Payer: Self-pay | Admitting: *Deleted

## 2015-11-17 DIAGNOSIS — K2100 Gastro-esophageal reflux disease with esophagitis, without bleeding: Secondary | ICD-10-CM

## 2015-11-17 NOTE — Telephone Encounter (Signed)
Order in [facility] for barium swallow. Patient wishes to call and arrange due to her schedule.  Records will be forwarded to Dr. Bernette Mayers referral coordinator. Their office will contact patient to arrange an appointment.

## 2015-11-17 NOTE — Telephone Encounter (Signed)
-----   Message from Robert Bellow, MD sent at 11/14/2015  1:18 PM EDT ----- Please arrange for a barium swallow to evaluate for reflux and esophagitis. Please arrange for an evaluation with Dr. Duke Salvia for possible fundoplication.

## 2015-11-19 DIAGNOSIS — F39 Unspecified mood [affective] disorder: Secondary | ICD-10-CM | POA: Diagnosis not present

## 2015-11-20 ENCOUNTER — Encounter: Payer: Self-pay | Admitting: General Surgery

## 2015-11-21 ENCOUNTER — Ambulatory Visit (INDEPENDENT_AMBULATORY_CARE_PROVIDER_SITE_OTHER): Payer: 59 | Admitting: Family Medicine

## 2015-11-21 ENCOUNTER — Ambulatory Visit
Admission: RE | Admit: 2015-11-21 | Discharge: 2015-11-21 | Disposition: A | Discharge status: Home / Self Care | Payer: 59 | Source: Ambulatory Visit | Attending: General Surgery | Admitting: General Surgery

## 2015-11-21 ENCOUNTER — Encounter: Payer: Self-pay | Admitting: Family Medicine

## 2015-11-21 VITALS — BP 121/78 | HR 88 | Ht 62.0 in | Wt 182.6 lb

## 2015-11-21 DIAGNOSIS — R300 Dysuria: Secondary | ICD-10-CM

## 2015-11-21 DIAGNOSIS — Z124 Encounter for screening for malignant neoplasm of cervix: Secondary | ICD-10-CM

## 2015-11-21 DIAGNOSIS — K2100 Gastro-esophageal reflux disease with esophagitis, without bleeding: Secondary | ICD-10-CM

## 2015-11-21 DIAGNOSIS — F39 Unspecified mood [affective] disorder: Secondary | ICD-10-CM | POA: Diagnosis not present

## 2015-11-21 DIAGNOSIS — K449 Diaphragmatic hernia without obstruction or gangrene: Secondary | ICD-10-CM | POA: Insufficient documentation

## 2015-11-21 DIAGNOSIS — Z01419 Encounter for gynecological examination (general) (routine) without abnormal findings: Secondary | ICD-10-CM | POA: Diagnosis not present

## 2015-11-21 DIAGNOSIS — F902 Attention-deficit hyperactivity disorder, combined type: Secondary | ICD-10-CM | POA: Diagnosis not present

## 2015-11-21 DIAGNOSIS — Z3041 Encounter for surveillance of contraceptive pills: Secondary | ICD-10-CM

## 2015-11-21 DIAGNOSIS — Z1151 Encounter for screening for human papillomavirus (HPV): Secondary | ICD-10-CM

## 2015-11-21 DIAGNOSIS — Z1239 Encounter for other screening for malignant neoplasm of breast: Secondary | ICD-10-CM

## 2015-11-21 LAB — CBC
HEMATOCRIT: 33.1 % — AB (ref 35.0–45.0)
HEMOGLOBIN: 10.9 g/dL — AB (ref 11.7–15.5)
MCH: 27.1 pg (ref 27.0–33.0)
MCHC: 32.9 g/dL (ref 32.0–36.0)
MCV: 82.3 fL (ref 80.0–100.0)
MPV: 11.7 fL (ref 7.5–12.5)
Platelets: 407 10*3/uL — ABNORMAL HIGH (ref 140–400)
RBC: 4.02 MIL/uL (ref 3.80–5.10)
RDW: 17.3 % — ABNORMAL HIGH (ref 11.0–15.0)
WBC: 6.5 10*3/uL (ref 3.8–10.8)

## 2015-11-21 LAB — COMPREHENSIVE METABOLIC PANEL
ALBUMIN: 3.7 g/dL (ref 3.6–5.1)
ALT: 19 U/L (ref 6–29)
AST: 18 U/L (ref 10–35)
Alkaline Phosphatase: 57 U/L (ref 33–115)
BUN: 13 mg/dL (ref 7–25)
CALCIUM: 8.6 mg/dL (ref 8.6–10.2)
CO2: 28 mmol/L (ref 20–31)
Chloride: 95 mmol/L — ABNORMAL LOW (ref 98–110)
Creat: 0.68 mg/dL (ref 0.50–1.10)
Glucose, Bld: 93 mg/dL (ref 65–99)
Potassium: 3.9 mmol/L (ref 3.5–5.3)
Sodium: 132 mmol/L — ABNORMAL LOW (ref 135–146)
Total Bilirubin: 0.2 mg/dL (ref 0.2–1.2)
Total Protein: 6.1 g/dL (ref 6.1–8.1)

## 2015-11-21 LAB — LIPID PANEL
CHOL/HDL RATIO: 5.4 ratio — AB (ref <=5.0)
Cholesterol: 178 mg/dL (ref 125–200)
HDL: 33 mg/dL — ABNORMAL LOW (ref >=46)
LDL Cholesterol: 115 mg/dL (ref <130)
Triglycerides: 151 mg/dL — ABNORMAL HIGH (ref <150)
VLDL: 30 mg/dL (ref <30)

## 2015-11-21 LAB — POCT URINALYSIS DIPSTICK
BILIRUBIN UA: NEGATIVE
GLUCOSE UA: NEGATIVE
Ketones, UA: NEGATIVE
Leukocytes, UA: NEGATIVE
NITRITE UA: NEGATIVE
PH UA: 7
Protein, UA: NEGATIVE
RBC UA: NEGATIVE
SPEC GRAV UA: 1.02
UROBILINOGEN UA: 0.2

## 2015-11-21 MED ORDER — NORETHINDRONE ACET-ETHINYL EST 1-20 MG-MCG PO TABS: 1.0000 | ORAL_TABLET | Freq: Every day | ORAL | Status: DC

## 2015-11-21 NOTE — Progress Notes (Signed)
  Subjective:     Mandy Torres is a 45 y.o. female and is here for a comprehensive physical exam. The patient reports problems - dysuria. Reports vaginal tenderness with intercourse following a case of BV.  Social History   Social History  . Marital Status: Single    Spouse Name: N/A  . Number of Children: N/A  . Years of Education: N/A   Occupational History  . Not on file.   Social History Main Topics  . Smoking status: Never Smoker   . Smokeless tobacco: Never Used  . Alcohol Use: No     Comment: social  . Drug Use: No  . Sexual Activity: Yes    Birth Control/ Protection: Pill   Other Topics Concern  . Not on file   Social History Narrative   Single   0 children   0 pregnancies    First menstrual cycle: 45 yrs old         Health Maintenance  Topic Date Due  . HIV Screening  03/03/1984  . TETANUS/TDAP  03/03/1988  . INFLUENZA VACCINE  02/10/2016  . PAP SMEAR  04/02/2017    The following portions of the patient's history were reviewed and updated as appropriate: allergies, current medications, past family history, past medical history, past social history, past surgical history and problem list.  Review of Systems Pertinent items noted in HPI and remainder of comprehensive ROS otherwise negative.   Objective:    BP 121/78 mmHg  Pulse 88  Ht 5\' 2"  (1.575 m)  Wt 182 lb 9.6 oz (82.827 kg)  BMI 33.39 kg/m2 General appearance: alert, cooperative and appears stated age Head: Normocephalic, without obvious abnormality, atraumatic Neck: no adenopathy, supple, symmetrical, trachea midline and thyroid not enlarged, symmetric, no tenderness/mass/nodules Lungs: clear to auscultation bilaterally Breasts: normal appearance, no masses or tenderness Heart: regular rate and rhythm, S1, S2 normal, no murmur, click, rub or gallop Abdomen: soft, non-tender; bowel sounds normal; no masses,  no organomegaly Pelvic: cervix normal in appearance, external genitalia normal,  no adnexal masses or tenderness, no cervical motion tenderness, uterus normal size, shape, and consistency and vagina normal without discharge Extremities: extremities normal, atraumatic, no cyanosis or edema Pulses: 2+ and symmetric Skin: Skin color, texture, turgor normal. No rashes or lesions Lymph nodes: Cervical, supraclavicular, and axillary nodes normal. Neurologic: Grossly normal    Assessment:    Healthy female exam.      Plan:  1. Screening for malignant neoplasm of cervix - Cytology - PAP  2. Breast screening - MM DIGITAL SCREENING BILATERAL; Future  3. Encounter for routine gynecological examination Normal exam - CBC - Comprehensive metabolic panel - Lipid panel  4. Dysuria Normal Urinalysis--will check culture - POCT Urinalysis Dipstick - Urine culture  5. Encounter for surveillance of contraceptive pills Refilled--does continuous for 3 months. Not obviously menopausal with labs done last year. - norethindrone-ethinyl estradiol (MICROGESTIN,JUNEL,LOESTRIN) 1-20 MG-MCG tablet; Take 1 tablet by mouth daily.  Dispense: 3 Package; Refill: 6    See After Visit Summary for Counseling Recommendations

## 2015-11-21 NOTE — Patient Instructions (Signed)
Preventive Care for Adults, Female A healthy lifestyle and preventive care can promote health and wellness. Preventive health guidelines for women include the following key practices.  A routine yearly physical is a good way to check with your health care provider about your health and preventive screening. It is a chance to share any concerns and updates on your health and to receive a thorough exam.  Visit your dentist for a routine exam and preventive care every 6 months. Brush your teeth twice a day and floss once a day. Good oral hygiene prevents tooth decay and gum disease.  The frequency of eye exams is based on your age, health, family medical history, use of contact lenses, and other factors. Follow your health care provider's recommendations for frequency of eye exams.  Eat a healthy diet. Foods like vegetables, fruits, whole grains, low-fat dairy products, and lean protein foods contain the nutrients you need without too many calories. Decrease your intake of foods high in solid fats, added sugars, and salt. Eat the right amount of calories for you.Get information about a proper diet from your health care provider, if necessary.  Regular physical exercise is one of the most important things you can do for your health. Most adults should get at least 150 minutes of moderate-intensity exercise (any activity that increases your heart rate and causes you to sweat) each week. In addition, most adults need muscle-strengthening exercises on 2 or more days a week.  Maintain a healthy weight. The body mass index (BMI) is a screening tool to identify possible weight problems. It provides an estimate of body fat based on height and weight. Your health care provider can find your BMI and can help you achieve or maintain a healthy weight.For adults 20 years and older:  A BMI below 18.5 is considered underweight.  A BMI of 18.5 to 24.9 is normal.  A BMI of 25 to 29.9 is considered overweight.  A  BMI of 30 and above is considered obese.  Maintain normal blood lipids and cholesterol levels by exercising and minimizing your intake of saturated fat. Eat a balanced diet with plenty of fruit and vegetables. Blood tests for lipids and cholesterol should begin at age 45 and be repeated every 5 years. If your lipid or cholesterol levels are high, you are over 50, or you are at high risk for heart disease, you may need your cholesterol levels checked more frequently.Ongoing high lipid and cholesterol levels should be treated with medicines if diet and exercise are not working.  If you smoke, find out from your health care provider how to quit. If you do not use tobacco, do not start.  Lung cancer screening is recommended for adults aged 45-80 years who are at high risk for developing lung cancer because of a history of smoking. A yearly low-dose CT scan of the lungs is recommended for people who have at least a 30-pack-year history of smoking and are a current smoker or have quit within the past 15 years. A pack year of smoking is smoking an average of 1 pack of cigarettes a day for 1 year (for example: 1 pack a day for 30 years or 2 packs a day for 15 years). Yearly screening should continue until the smoker has stopped smoking for at least 15 years. Yearly screening should be stopped for people who develop a health problem that would prevent them from having lung cancer treatment.  If you are pregnant, do not drink alcohol. If you are  breastfeeding, be very cautious about drinking alcohol. If you are not pregnant and choose to drink alcohol, do not have more than 1 drink per day. One drink is considered to be 12 ounces (355 mL) of beer, 5 ounces (148 mL) of wine, or 1.5 ounces (44 mL) of liquor.  Avoid use of street drugs. Do not share needles with anyone. Ask for help if you need support or instructions about stopping the use of drugs.  High blood pressure causes heart disease and increases the risk  of stroke. Your blood pressure should be checked at least every 1 to 2 years. Ongoing high blood pressure should be treated with medicines if weight loss and exercise do not work.  If you are 55-79 years old, ask your health care provider if you should take aspirin to prevent strokes.  Diabetes screening is done by taking a blood sample to check your blood glucose level after you have not eaten for a certain period of time (fasting). If you are not overweight and you do not have risk factors for diabetes, you should be screened once every 3 years starting at age 45. If you are overweight or obese and you are 40-70 years of age, you should be screened for diabetes every year as part of your cardiovascular risk assessment.  Breast cancer screening is essential preventive care for women. You should practice "breast self-awareness." This means understanding the normal appearance and feel of your breasts and may include breast self-examination. Any changes detected, no matter how small, should be reported to a health care provider. Women in their 20s and 30s should have a clinical breast exam (CBE) by a health care provider as part of a regular health exam every 1 to 3 years. After age 40, women should have a CBE every year. Starting at age 40, women should consider having a mammogram (breast X-ray test) every year. Women who have a family history of breast cancer should talk to their health care provider about genetic screening. Women at a high risk of breast cancer should talk to their health care providers about having an MRI and a mammogram every year.  Breast cancer gene (BRCA)-related cancer risk assessment is recommended for women who have family members with BRCA-related cancers. BRCA-related cancers include breast, ovarian, tubal, and peritoneal cancers. Having family members with these cancers may be associated with an increased risk for harmful changes (mutations) in the breast cancer genes BRCA1 and  BRCA2. Results of the assessment will determine the need for genetic counseling and BRCA1 and BRCA2 testing.  Your health care provider may recommend that you be screened regularly for cancer of the pelvic organs (ovaries, uterus, and vagina). This screening involves a pelvic examination, including checking for microscopic changes to the surface of your cervix (Pap test). You may be encouraged to have this screening done every 3 years, beginning at age 21.  For women ages 30-65, health care providers may recommend pelvic exams and Pap testing every 3 years, or they may recommend the Pap and pelvic exam, combined with testing for human papilloma virus (HPV), every 5 years. Some types of HPV increase your risk of cervical cancer. Testing for HPV may also be done on women of any age with unclear Pap test results.  Other health care providers may not recommend any screening for nonpregnant women who are considered low risk for pelvic cancer and who do not have symptoms. Ask your health care provider if a screening pelvic exam is right for   you.  If you have had past treatment for cervical cancer or a condition that could lead to cancer, you need Pap tests and screening for cancer for at least 20 years after your treatment. If Pap tests have been discontinued, your risk factors (such as having a new sexual partner) need to be reassessed to determine if screening should resume. Some women have medical problems that increase the chance of getting cervical cancer. In these cases, your health care provider may recommend more frequent screening and Pap tests.  Colorectal cancer can be detected and often prevented. Most routine colorectal cancer screening begins at the age of 50 years and continues through age 75 years. However, your health care provider may recommend screening at an earlier age if you have risk factors for colon cancer. On a yearly basis, your health care provider may provide home test kits to check  for hidden blood in the stool. Use of a small camera at the end of a tube, to directly examine the colon (sigmoidoscopy or colonoscopy), can detect the earliest forms of colorectal cancer. Talk to your health care provider about this at age 50, when routine screening begins. Direct exam of the colon should be repeated every 5-10 years through age 75 years, unless early forms of precancerous polyps or small growths are found.  People who are at an increased risk for hepatitis B should be screened for this virus. You are considered at high risk for hepatitis B if:  You were born in a country where hepatitis B occurs often. Talk with your health care provider about which countries are considered high risk.  Your parents were born in a high-risk country and you have not received a shot to protect against hepatitis B (hepatitis B vaccine).  You have HIV or AIDS.  You use needles to inject street drugs.  You live with, or have sex with, someone who has hepatitis B.  You get hemodialysis treatment.  You take certain medicines for conditions like cancer, organ transplantation, and autoimmune conditions.  Hepatitis C blood testing is recommended for all people born from 1945 through 1965 and any individual with known risks for hepatitis C.  Practice safe sex. Use condoms and avoid high-risk sexual practices to reduce the spread of sexually transmitted infections (STIs). STIs include gonorrhea, chlamydia, syphilis, trichomonas, herpes, HPV, and human immunodeficiency virus (HIV). Herpes, HIV, and HPV are viral illnesses that have no cure. They can result in disability, cancer, and death.  You should be screened for sexually transmitted illnesses (STIs) including gonorrhea and chlamydia if:  You are sexually active and are younger than 24 years.  You are older than 24 years and your health care provider tells you that you are at risk for this type of infection.  Your sexual activity has changed  since you were last screened and you are at an increased risk for chlamydia or gonorrhea. Ask your health care provider if you are at risk.  If you are at risk of being infected with HIV, it is recommended that you take a prescription medicine daily to prevent HIV infection. This is called preexposure prophylaxis (PrEP). You are considered at risk if:  You are sexually active and do not regularly use condoms or know the HIV status of your partner(s).  You take drugs by injection.  You are sexually active with a partner who has HIV.  Talk with your health care provider about whether you are at high risk of being infected with HIV. If   you choose to begin PrEP, you should first be tested for HIV. You should then be tested every 3 months for as long as you are taking PrEP.  Osteoporosis is a disease in which the bones lose minerals and strength with aging. This can result in serious bone fractures or breaks. The risk of osteoporosis can be identified using a bone density scan. Women ages 67 years and over and women at risk for fractures or osteoporosis should discuss screening with their health care providers. Ask your health care provider whether you should take a calcium supplement or vitamin D to reduce the rate of osteoporosis.  Menopause can be associated with physical symptoms and risks. Hormone replacement therapy is available to decrease symptoms and risks. You should talk to your health care provider about whether hormone replacement therapy is right for you.  Use sunscreen. Apply sunscreen liberally and repeatedly throughout the day. You should seek shade when your shadow is shorter than you. Protect yourself by wearing long sleeves, pants, a wide-brimmed hat, and sunglasses year round, whenever you are outdoors.  Once a month, do a whole body skin exam, using a mirror to look at the skin on your back. Tell your health care provider of new moles, moles that have irregular borders, moles that  are larger than a pencil eraser, or moles that have changed in shape or color.  Stay current with required vaccines (immunizations).  Influenza vaccine. All adults should be immunized every year.  Tetanus, diphtheria, and acellular pertussis (Td, Tdap) vaccine. Pregnant women should receive 1 dose of Tdap vaccine during each pregnancy. The dose should be obtained regardless of the length of time since the last dose. Immunization is preferred during the 27th-36th week of gestation. An adult who has not previously received Tdap or who does not know her vaccine status should receive 1 dose of Tdap. This initial dose should be followed by tetanus and diphtheria toxoids (Td) booster doses every 10 years. Adults with an unknown or incomplete history of completing a 3-dose immunization series with Td-containing vaccines should begin or complete a primary immunization series including a Tdap dose. Adults should receive a Td booster every 10 years.  Varicella vaccine. An adult without evidence of immunity to varicella should receive 2 doses or a second dose if she has previously received 1 dose. Pregnant females who do not have evidence of immunity should receive the first dose after pregnancy. This first dose should be obtained before leaving the health care facility. The second dose should be obtained 4-8 weeks after the first dose.  Human papillomavirus (HPV) vaccine. Females aged 13-26 years who have not received the vaccine previously should obtain the 3-dose series. The vaccine is not recommended for use in pregnant females. However, pregnancy testing is not needed before receiving a dose. If a female is found to be pregnant after receiving a dose, no treatment is needed. In that case, the remaining doses should be delayed until after the pregnancy. Immunization is recommended for any person with an immunocompromised condition through the age of 61 years if she did not get any or all doses earlier. During the  3-dose series, the second dose should be obtained 4-8 weeks after the first dose. The third dose should be obtained 24 weeks after the first dose and 16 weeks after the second dose.  Zoster vaccine. One dose is recommended for adults aged 30 years or older unless certain conditions are present.  Measles, mumps, and rubella (MMR) vaccine. Adults born  before 1957 generally are considered immune to measles and mumps. Adults born in 1957 or later should have 1 or more doses of MMR vaccine unless there is a contraindication to the vaccine or there is laboratory evidence of immunity to each of the three diseases. A routine second dose of MMR vaccine should be obtained at least 28 days after the first dose for students attending postsecondary schools, health care workers, or international travelers. People who received inactivated measles vaccine or an unknown type of measles vaccine during 1963-1967 should receive 2 doses of MMR vaccine. People who received inactivated mumps vaccine or an unknown type of mumps vaccine before 1979 and are at high risk for mumps infection should consider immunization with 2 doses of MMR vaccine. For females of childbearing age, rubella immunity should be determined. If there is no evidence of immunity, females who are not pregnant should be vaccinated. If there is no evidence of immunity, females who are pregnant should delay immunization until after pregnancy. Unvaccinated health care workers born before 1957 who lack laboratory evidence of measles, mumps, or rubella immunity or laboratory confirmation of disease should consider measles and mumps immunization with 2 doses of MMR vaccine or rubella immunization with 1 dose of MMR vaccine.  Pneumococcal 13-valent conjugate (PCV13) vaccine. When indicated, a person who is uncertain of his immunization history and has no record of immunization should receive the PCV13 vaccine. All adults 65 years of age and older should receive this  vaccine. An adult aged 19 years or older who has certain medical conditions and has not been previously immunized should receive 1 dose of PCV13 vaccine. This PCV13 should be followed with a dose of pneumococcal polysaccharide (PPSV23) vaccine. Adults who are at high risk for pneumococcal disease should obtain the PPSV23 vaccine at least 8 weeks after the dose of PCV13 vaccine. Adults older than 47 years of age who have normal immune system function should obtain the PPSV23 vaccine dose at least 1 year after the dose of PCV13 vaccine.  Pneumococcal polysaccharide (PPSV23) vaccine. When PCV13 is also indicated, PCV13 should be obtained first. All adults aged 65 years and older should be immunized. An adult younger than age 65 years who has certain medical conditions should be immunized. Any person who resides in a nursing home or long-term care facility should be immunized. An adult smoker should be immunized. People with an immunocompromised condition and certain other conditions should receive both PCV13 and PPSV23 vaccines. People with human immunodeficiency virus (HIV) infection should be immunized as soon as possible after diagnosis. Immunization during chemotherapy or radiation therapy should be avoided. Routine use of PPSV23 vaccine is not recommended for American Indians, [place] Natives, or people younger than 65 years unless there are medical conditions that require PPSV23 vaccine. When indicated, people who have unknown immunization and have no record of immunization should receive PPSV23 vaccine. One-time revaccination 5 years after the first dose of PPSV23 is recommended for people aged 19-64 years who have chronic kidney failure, nephrotic syndrome, asplenia, or immunocompromised conditions. People who received 1-2 doses of PPSV23 before age 65 years should receive another dose of PPSV23 vaccine at age 65 years or later if at least 5 years have passed since the previous dose. Doses of PPSV23 are not  needed for people immunized with PPSV23 at or after age 65 years.  Meningococcal vaccine. Adults with asplenia or persistent complement component deficiencies should receive 2 doses of quadrivalent meningococcal conjugate (MenACWY-D) vaccine. The doses should be obtained   at least 2 months apart. Microbiologists working with certain meningococcal bacteria, [place] recruits, people at risk during an outbreak, and people who travel to or live in countries with a high rate of meningitis should be immunized. A first-year college student up through age 34 years who is living in a residence hall should receive a dose if she did not receive a dose on or after her 16th birthday. Adults who have certain high-risk conditions should receive one or more doses of vaccine.  Hepatitis A vaccine. Adults who wish to be protected from this disease, have certain high-risk conditions, work with hepatitis A-infected animals, work in hepatitis A research labs, or travel to or work in countries with a high rate of hepatitis A should be immunized. Adults who were previously unvaccinated and who anticipate close contact with an international adoptee during the first 60 days after arrival in the [place] from a country with a high rate of hepatitis A should be immunized.  Hepatitis B vaccine. Adults who wish to be protected from this disease, have certain high-risk conditions, may be exposed to blood or other infectious body fluids, are household contacts or sex partners of hepatitis B positive people, are clients or workers in certain care facilities, or travel to or work in countries with a high rate of hepatitis B should be immunized.  Haemophilus influenzae type b (Hib) vaccine. A previously unvaccinated person with asplenia or sickle cell disease or having a scheduled splenectomy should receive 1 dose of Hib vaccine. Regardless of previous immunization, a recipient of a hematopoietic stem cell transplant should receive a  3-dose series 6-12 months after her successful transplant. Hib vaccine is not recommended for adults with HIV infection. Preventive Services / Frequency Ages 35 to 4 years  Blood pressure check.** / Every 3-5 years.  Lipid and cholesterol check.** / Every 5 years beginning at age 60.  Clinical breast exam.** / Every 3 years for women in their 71s and 10s.  BRCA-related cancer risk assessment.** / For women who have family members with a BRCA-related cancer (breast, ovarian, tubal, or peritoneal cancers).  Pap test.** / Every 2 years from ages 76 through 26. Every 3 years starting at age 61 through age 76 or 93 with a history of 3 consecutive normal Pap tests.  HPV screening.** / Every 3 years from ages 37 through ages 60 to 51 with a history of 3 consecutive normal Pap tests.  Hepatitis C blood test.** / For any individual with known risks for hepatitis C.  Skin self-exam. / Monthly.  Influenza vaccine. / Every year.  Tetanus, diphtheria, and acellular pertussis (Tdap, Td) vaccine.** / Consult your health care provider. Pregnant women should receive 1 dose of Tdap vaccine during each pregnancy. 1 dose of Td every 10 years.  Varicella vaccine.** / Consult your health care provider. Pregnant females who do not have evidence of immunity should receive the first dose after pregnancy.  HPV vaccine. / 3 doses over 6 months, if 93 and younger. The vaccine is not recommended for use in pregnant females. However, pregnancy testing is not needed before receiving a dose.  Measles, mumps, rubella (MMR) vaccine.** / You need at least 1 dose of MMR if you were born in 1957 or later. You may also need a 2nd dose. For females of childbearing age, rubella immunity should be determined. If there is no evidence of immunity, females who are not pregnant should be vaccinated. If there is no evidence of immunity, females who are  pregnant should delay immunization until after pregnancy.  Pneumococcal  13-valent conjugate (PCV13) vaccine.** / Consult your health care provider.  Pneumococcal polysaccharide (PPSV23) vaccine.** / 1 to 2 doses if you smoke cigarettes or if you have certain conditions.  Meningococcal vaccine.** / 1 dose if you are age 68 to 8 years and a Market researcher living in a residence hall, or have one of several medical conditions, you need to get vaccinated against meningococcal disease. You may also need additional booster doses.  Hepatitis A vaccine.** / Consult your health care provider.  Hepatitis B vaccine.** / Consult your health care provider.  Haemophilus influenzae type b (Hib) vaccine.** / Consult your health care provider. Ages 7 to 53 years  Blood pressure check.** / Every year.  Lipid and cholesterol check.** / Every 5 years beginning at age 25 years.  Lung cancer screening. / Every year if you are aged 11-80 years and have a 30-pack-year history of smoking and currently smoke or have quit within the past 15 years. Yearly screening is stopped once you have quit smoking for at least 15 years or develop a health problem that would prevent you from having lung cancer treatment.  Clinical breast exam.** / Every year after age 48 years.  BRCA-related cancer risk assessment.** / For women who have family members with a BRCA-related cancer (breast, ovarian, tubal, or peritoneal cancers).  Mammogram.** / Every year beginning at age 41 years and continuing for as long as you are in good health. Consult with your health care provider.  Pap test.** / Every 3 years starting at age 65 years through age 37 or 70 years with a history of 3 consecutive normal Pap tests.  HPV screening.** / Every 3 years from ages 72 years through ages 60 to 40 years with a history of 3 consecutive normal Pap tests.  Fecal occult blood test (FOBT) of stool. / Every year beginning at age 21 years and continuing until age 5 years. You may not need to do this test if you get  a colonoscopy every 10 years.  Flexible sigmoidoscopy or colonoscopy.** / Every 5 years for a flexible sigmoidoscopy or every 10 years for a colonoscopy beginning at age 35 years and continuing until age 48 years.  Hepatitis C blood test.** / For all people born from [birth year] through 1965 and any individual with known risks for hepatitis C.  Skin self-exam. / Monthly.  Influenza vaccine. / Every year.  Tetanus, diphtheria, and acellular pertussis (Tdap/Td) vaccine.** / Consult your health care provider. Pregnant women should receive 1 dose of Tdap vaccine during each pregnancy. 1 dose of Td every 10 years.  Varicella vaccine.** / Consult your health care provider. Pregnant females who do not have evidence of immunity should receive the first dose after pregnancy.  Zoster vaccine.** / 1 dose for adults aged 30 years or older.  Measles, mumps, rubella (MMR) vaccine.** / You need at least 1 dose of MMR if you were born in 1957 or later. You may also need a second dose. For females of childbearing age, rubella immunity should be determined. If there is no evidence of immunity, females who are not pregnant should be vaccinated. If there is no evidence of immunity, females who are pregnant should delay immunization until after pregnancy.  Pneumococcal 13-valent conjugate (PCV13) vaccine.** / Consult your health care provider.  Pneumococcal polysaccharide (PPSV23) vaccine.** / 1 to 2 doses if you smoke cigarettes or if you have certain conditions.  Meningococcal vaccine.** /  Consult your health care provider.  Hepatitis A vaccine.** / Consult your health care provider.  Hepatitis B vaccine.** / Consult your health care provider.  Haemophilus influenzae type b (Hib) vaccine.** / Consult your health care provider. Ages 64 years and over  Blood pressure check.** / Every year.  Lipid and cholesterol check.** / Every 5 years beginning at age 23 years.  Lung cancer screening. / Every year if you  are aged 16-80 years and have a 30-pack-year history of smoking and currently smoke or have quit within the past 15 years. Yearly screening is stopped once you have quit smoking for at least 15 years or develop a health problem that would prevent you from having lung cancer treatment.  Clinical breast exam.** / Every year after age 74 years.  BRCA-related cancer risk assessment.** / For women who have family members with a BRCA-related cancer (breast, ovarian, tubal, or peritoneal cancers).  Mammogram.** / Every year beginning at age 44 years and continuing for as long as you are in good health. Consult with your health care provider.  Pap test.** / Every 3 years starting at age 58 years through age 22 or 39 years with 3 consecutive normal Pap tests. Testing can be stopped between 65 and 70 years with 3 consecutive normal Pap tests and no abnormal Pap or HPV tests in the past 10 years.  HPV screening.** / Every 3 years from ages 64 years through ages 70 or 61 years with a history of 3 consecutive normal Pap tests. Testing can be stopped between 65 and 70 years with 3 consecutive normal Pap tests and no abnormal Pap or HPV tests in the past 10 years.  Fecal occult blood test (FOBT) of stool. / Every year beginning at age 40 years and continuing until age 27 years. You may not need to do this test if you get a colonoscopy every 10 years.  Flexible sigmoidoscopy or colonoscopy.** / Every 5 years for a flexible sigmoidoscopy or every 10 years for a colonoscopy beginning at age 7 years and continuing until age 32 years.  Hepatitis C blood test.** / For all people born from [birth year] through 1967 and any individual with known risks for hepatitis C.  Osteoporosis screening.** / A one-time screening for women ages 30 years and over and women at risk for fractures or osteoporosis.  Skin self-exam. / Monthly.  Influenza vaccine. / Every year.  Tetanus, diphtheria, and acellular pertussis (Tdap/Td)  vaccine.** / 1 dose of Td every 10 years.  Varicella vaccine.** / Consult your health care provider.  Zoster vaccine.** / 1 dose for adults aged 35 years or older.  Pneumococcal 13-valent conjugate (PCV13) vaccine.** / Consult your health care provider.  Pneumococcal polysaccharide (PPSV23) vaccine.** / 1 dose for all adults aged 46 years and older.  Meningococcal vaccine.** / Consult your health care provider.  Hepatitis A vaccine.** / Consult your health care provider.  Hepatitis B vaccine.** / Consult your health care provider.  Haemophilus influenzae type b (Hib) vaccine.** / Consult your health care provider. ** Family history and personal history of risk and conditions may change your health care provider's recommendations.   This information is not intended to replace advice given to you by your health care provider. Make sure you discuss any questions you have with your health care provider.   Document Released: 08/24/2001 Document Revised: 07/19/2014 Document Reviewed: 11/23/2010 Elsevier Interactive Patient Education [employer].

## 2015-11-23 LAB — URINE CULTURE
Colony Count: NO GROWTH
Organism ID, Bacteria: NO GROWTH

## 2015-11-25 DIAGNOSIS — E559 Vitamin D deficiency, unspecified: Secondary | ICD-10-CM | POA: Diagnosis not present

## 2015-11-25 DIAGNOSIS — Z79899 Other long term (current) drug therapy: Secondary | ICD-10-CM | POA: Diagnosis not present

## 2015-11-25 DIAGNOSIS — F39 Unspecified mood [affective] disorder: Secondary | ICD-10-CM | POA: Diagnosis not present

## 2015-11-25 LAB — CYTOLOGY - PAP

## 2015-11-26 DIAGNOSIS — Z8614 Personal history of Methicillin resistant Staphylococcus aureus infection: Secondary | ICD-10-CM | POA: Insufficient documentation

## 2015-11-26 DIAGNOSIS — K210 Gastro-esophageal reflux disease with esophagitis: Secondary | ICD-10-CM | POA: Diagnosis not present

## 2015-11-26 DIAGNOSIS — K449 Diaphragmatic hernia without obstruction or gangrene: Secondary | ICD-10-CM | POA: Diagnosis not present

## 2015-12-02 DIAGNOSIS — F39 Unspecified mood [affective] disorder: Secondary | ICD-10-CM | POA: Diagnosis not present

## 2015-12-03 ENCOUNTER — Other Ambulatory Visit: Payer: Self-pay | Admitting: Family Medicine

## 2015-12-05 DIAGNOSIS — F902 Attention-deficit hyperactivity disorder, combined type: Secondary | ICD-10-CM | POA: Diagnosis not present

## 2015-12-05 DIAGNOSIS — F39 Unspecified mood [affective] disorder: Secondary | ICD-10-CM | POA: Diagnosis not present

## 2015-12-16 ENCOUNTER — Encounter: Payer: Self-pay | Admitting: *Deleted

## 2015-12-17 ENCOUNTER — Ambulatory Visit
Admission: RE | Admit: 2015-12-17 | Discharge: 2015-12-17 | Disposition: A | Discharge status: Home / Self Care | Payer: 59 | Source: Ambulatory Visit | Attending: Gastroenterology | Admitting: Gastroenterology

## 2015-12-17 ENCOUNTER — Ambulatory Visit (INDEPENDENT_AMBULATORY_CARE_PROVIDER_SITE_OTHER): Payer: 59 | Admitting: Podiatry

## 2015-12-17 ENCOUNTER — Encounter
Admission: RE | Disposition: A | Discharge status: Home / Self Care | Payer: Self-pay | Source: Ambulatory Visit | Attending: Gastroenterology

## 2015-12-17 ENCOUNTER — Encounter: Payer: Self-pay | Admitting: Podiatry

## 2015-12-17 ENCOUNTER — Ambulatory Visit (INDEPENDENT_AMBULATORY_CARE_PROVIDER_SITE_OTHER): Payer: 59

## 2015-12-17 DIAGNOSIS — M2012 Hallux valgus (acquired), left foot: Secondary | ICD-10-CM | POA: Diagnosis not present

## 2015-12-17 DIAGNOSIS — F458 Other somatoform disorders: Secondary | ICD-10-CM | POA: Insufficient documentation

## 2015-12-17 DIAGNOSIS — R52 Pain, unspecified: Secondary | ICD-10-CM

## 2015-12-17 DIAGNOSIS — M779 Enthesopathy, unspecified: Secondary | ICD-10-CM | POA: Diagnosis not present

## 2015-12-17 DIAGNOSIS — R112 Nausea with vomiting, unspecified: Secondary | ICD-10-CM | POA: Diagnosis not present

## 2015-12-17 DIAGNOSIS — G5782 Other specified mononeuropathies of left lower limb: Secondary | ICD-10-CM

## 2015-12-17 DIAGNOSIS — G5762 Lesion of plantar nerve, left lower limb: Secondary | ICD-10-CM

## 2015-12-17 DIAGNOSIS — R1110 Vomiting, unspecified: Secondary | ICD-10-CM | POA: Diagnosis not present

## 2015-12-17 DIAGNOSIS — K219 Gastro-esophageal reflux disease without esophagitis: Secondary | ICD-10-CM | POA: Insufficient documentation

## 2015-12-17 DIAGNOSIS — Z79899 Other long term (current) drug therapy: Secondary | ICD-10-CM | POA: Diagnosis not present

## 2015-12-17 DIAGNOSIS — R12 Heartburn: Secondary | ICD-10-CM | POA: Insufficient documentation

## 2015-12-17 DIAGNOSIS — R09A2 Foreign body sensation, throat: Secondary | ICD-10-CM | POA: Insufficient documentation

## 2015-12-17 DIAGNOSIS — Z7951 Long term (current) use of inhaled steroids: Secondary | ICD-10-CM | POA: Diagnosis not present

## 2015-12-17 DIAGNOSIS — IMO0001 Reserved for inherently not codable concepts without codable children: Secondary | ICD-10-CM | POA: Insufficient documentation

## 2015-12-17 HISTORY — PX: ESOPHAGEAL MANOMETRY: SHX5429

## 2015-12-17 SURGERY — MANOMETRY, ESOPHAGUS

## 2015-12-17 MED ORDER — LIDOCAINE HCL 2 % EX GEL
CUTANEOUS | Status: AC
  Administered 2015-12-17: 2
  Filled 2015-12-17: qty 5

## 2015-12-17 MED ORDER — LIDOCAINE HCL 2 % EX GEL
1.0000 "application " | Freq: Once | CUTANEOUS | Status: AC
  Administered 2015-12-17: 2

## 2015-12-17 MED ORDER — BUTAMBEN-TETRACAINE-BENZOCAINE 2-2-14 % EX AERO
INHALATION_SPRAY | CUTANEOUS | Status: AC
Start: 2015-12-17 — End: 2015-12-17
  Administered 2015-12-17: 1 via TOPICAL
  Filled 2015-12-17: qty 20

## 2015-12-17 MED ORDER — BUTAMBEN-TETRACAINE-BENZOCAINE 2-2-14 % EX AERO
1.0000 | INHALATION_SPRAY | Freq: Once | CUTANEOUS | Status: AC
  Administered 2015-12-17: 1 via TOPICAL

## 2015-12-17 SURGICAL SUPPLY — 2 items
FACESHIELD LNG OPTICON STERILE (SAFETY) IMPLANT
GLOVE BIO SURGEON STRL SZ8 (GLOVE) ×6 IMPLANT

## 2015-12-17 NOTE — Progress Notes (Signed)
She presents today with a chief complaint of pain beneath the second metatarsophalangeal joint of her left foot. She states bothering her for the past few days and she also has radiating pains between her third and fourth toes of her left foot. Surgery was performed to her bunion left and a second metatarsal osteotomy previously. She states that she had had no problem with that. She does relate that is about time to get some new shoes are starting to wear out.  Objective: Vital signs stable alert and oriented 3. Pulses are palpable. Neurologic sensorium is intact. Muscle strength is normal. She has great range of motion of the first second metatarsophalangeal joints she has tenderness beneath the third metatarsophalangeal joint of the left foot and radiating pain on palpation to the third interdigital space left foot. Radiographs confirm well-healed osteotomies all in good position soft tissue swelling around the third and fourth MTPJ's.  Assessment: Capsulitis third metatarsophalangeal joint with neuroma third interdigital space.  Plan: I recommended he use shoes and discussed this in detail with her. I injected the third interdigital space with Kenalog and local anesthetic. I will follow-up with her in 1 month if necessary.

## 2015-12-18 ENCOUNTER — Encounter: Payer: Self-pay | Admitting: Gastroenterology

## 2015-12-19 DIAGNOSIS — H52223 Regular astigmatism, bilateral: Secondary | ICD-10-CM | POA: Diagnosis not present

## 2015-12-22 ENCOUNTER — Ambulatory Visit: Payer: 59 | Admitting: Podiatry

## 2015-12-24 DIAGNOSIS — I10 Essential (primary) hypertension: Secondary | ICD-10-CM | POA: Diagnosis not present

## 2015-12-24 DIAGNOSIS — K449 Diaphragmatic hernia without obstruction or gangrene: Secondary | ICD-10-CM | POA: Diagnosis not present

## 2015-12-24 DIAGNOSIS — F310 Bipolar disorder, current episode hypomanic: Secondary | ICD-10-CM | POA: Diagnosis not present

## 2015-12-24 DIAGNOSIS — K210 Gastro-esophageal reflux disease with esophagitis: Secondary | ICD-10-CM | POA: Diagnosis not present

## 2015-12-24 DIAGNOSIS — Z8614 Personal history of Methicillin resistant Staphylococcus aureus infection: Secondary | ICD-10-CM | POA: Diagnosis not present

## 2015-12-26 DIAGNOSIS — F902 Attention-deficit hyperactivity disorder, combined type: Secondary | ICD-10-CM | POA: Diagnosis not present

## 2015-12-26 DIAGNOSIS — F39 Unspecified mood [affective] disorder: Secondary | ICD-10-CM | POA: Diagnosis not present

## 2015-12-30 DIAGNOSIS — L218 Other seborrheic dermatitis: Secondary | ICD-10-CM | POA: Diagnosis not present

## 2015-12-30 DIAGNOSIS — L700 Acne vulgaris: Secondary | ICD-10-CM | POA: Diagnosis not present

## 2015-12-30 DIAGNOSIS — D2312 Other benign neoplasm of skin of left eyelid, including canthus: Secondary | ICD-10-CM | POA: Diagnosis not present

## 2015-12-31 ENCOUNTER — Ambulatory Visit
Admission: RE | Admit: 2015-12-31 | Discharge: 2015-12-31 | Disposition: A | Discharge status: Home / Self Care | Payer: 59 | Source: Ambulatory Visit | Attending: Gastroenterology | Admitting: Gastroenterology

## 2015-12-31 ENCOUNTER — Encounter
Admission: RE | Disposition: A | Discharge status: Home / Self Care | Payer: Self-pay | Source: Ambulatory Visit | Attending: Gastroenterology

## 2015-12-31 DIAGNOSIS — R7303 Prediabetes: Secondary | ICD-10-CM | POA: Diagnosis not present

## 2015-12-31 DIAGNOSIS — F909 Attention-deficit hyperactivity disorder, unspecified type: Secondary | ICD-10-CM | POA: Diagnosis not present

## 2015-12-31 DIAGNOSIS — F329 Major depressive disorder, single episode, unspecified: Secondary | ICD-10-CM | POA: Insufficient documentation

## 2015-12-31 DIAGNOSIS — K219 Gastro-esophageal reflux disease without esophagitis: Secondary | ICD-10-CM | POA: Insufficient documentation

## 2015-12-31 DIAGNOSIS — F458 Other somatoform disorders: Secondary | ICD-10-CM | POA: Insufficient documentation

## 2015-12-31 DIAGNOSIS — I10 Essential (primary) hypertension: Secondary | ICD-10-CM | POA: Insufficient documentation

## 2015-12-31 DIAGNOSIS — K449 Diaphragmatic hernia without obstruction or gangrene: Secondary | ICD-10-CM | POA: Insufficient documentation

## 2015-12-31 DIAGNOSIS — R5383 Other fatigue: Secondary | ICD-10-CM | POA: Diagnosis not present

## 2015-12-31 DIAGNOSIS — R1110 Vomiting, unspecified: Secondary | ICD-10-CM | POA: Insufficient documentation

## 2015-12-31 DIAGNOSIS — R12 Heartburn: Secondary | ICD-10-CM | POA: Diagnosis not present

## 2015-12-31 DIAGNOSIS — M533 Sacrococcygeal disorders, not elsewhere classified: Secondary | ICD-10-CM | POA: Insufficient documentation

## 2015-12-31 DIAGNOSIS — Z7951 Long term (current) use of inhaled steroids: Secondary | ICD-10-CM | POA: Diagnosis not present

## 2015-12-31 DIAGNOSIS — K629 Disease of anus and rectum, unspecified: Secondary | ICD-10-CM | POA: Diagnosis not present

## 2015-12-31 DIAGNOSIS — R591 Generalized enlarged lymph nodes: Secondary | ICD-10-CM | POA: Diagnosis not present

## 2015-12-31 DIAGNOSIS — M25561 Pain in right knee: Secondary | ICD-10-CM | POA: Diagnosis not present

## 2015-12-31 DIAGNOSIS — N926 Irregular menstruation, unspecified: Secondary | ICD-10-CM | POA: Diagnosis not present

## 2015-12-31 DIAGNOSIS — E785 Hyperlipidemia, unspecified: Secondary | ICD-10-CM | POA: Diagnosis not present

## 2015-12-31 DIAGNOSIS — E069 Thyroiditis, unspecified: Secondary | ICD-10-CM | POA: Diagnosis not present

## 2015-12-31 DIAGNOSIS — Z793 Long term (current) use of hormonal contraceptives: Secondary | ICD-10-CM | POA: Insufficient documentation

## 2015-12-31 DIAGNOSIS — Z79899 Other long term (current) drug therapy: Secondary | ICD-10-CM | POA: Insufficient documentation

## 2015-12-31 HISTORY — PX: 24 HOUR PH STUDY: SHX5419

## 2015-12-31 SURGERY — MONITORING, ESOPHAGEAL PH, 24 HOUR
Anesthesia: General

## 2015-12-31 MED ORDER — BUTAMBEN-TETRACAINE-BENZOCAINE 2-2-14 % EX AERO
1.0000 | INHALATION_SPRAY | Freq: Once | CUTANEOUS | Status: AC
  Administered 2015-12-31: 1 via TOPICAL

## 2015-12-31 MED ORDER — BUTAMBEN-TETRACAINE-BENZOCAINE 2-2-14 % EX AERO
INHALATION_SPRAY | CUTANEOUS | Status: AC
  Administered 2015-12-31: 1 via TOPICAL
  Filled 2015-12-31: qty 20

## 2016-01-01 ENCOUNTER — Encounter: Payer: Self-pay | Admitting: Gastroenterology

## 2016-01-05 DIAGNOSIS — F39 Unspecified mood [affective] disorder: Secondary | ICD-10-CM | POA: Diagnosis not present

## 2016-01-07 DIAGNOSIS — K449 Diaphragmatic hernia without obstruction or gangrene: Secondary | ICD-10-CM | POA: Diagnosis not present

## 2016-01-07 DIAGNOSIS — K210 Gastro-esophageal reflux disease with esophagitis: Secondary | ICD-10-CM | POA: Diagnosis not present

## 2016-01-07 DIAGNOSIS — G8918 Other acute postprocedural pain: Secondary | ICD-10-CM | POA: Diagnosis not present

## 2016-01-09 ENCOUNTER — Telehealth: Payer: 59 | Admitting: Family

## 2016-01-09 DIAGNOSIS — J069 Acute upper respiratory infection, unspecified: Secondary | ICD-10-CM

## 2016-01-09 DIAGNOSIS — R09A2 Foreign body sensation, throat: Secondary | ICD-10-CM | POA: Insufficient documentation

## 2016-01-09 DIAGNOSIS — R1110 Vomiting, unspecified: Secondary | ICD-10-CM

## 2016-01-09 DIAGNOSIS — B9689 Other specified bacterial agents as the cause of diseases classified elsewhere: Secondary | ICD-10-CM

## 2016-01-09 DIAGNOSIS — K219 Gastro-esophageal reflux disease without esophagitis: Secondary | ICD-10-CM | POA: Insufficient documentation

## 2016-01-09 DIAGNOSIS — IMO0001 Reserved for inherently not codable concepts without codable children: Secondary | ICD-10-CM | POA: Insufficient documentation

## 2016-01-09 DIAGNOSIS — F458 Other somatoform disorders: Secondary | ICD-10-CM | POA: Insufficient documentation

## 2016-01-09 MED ORDER — DOXYCYCLINE HYCLATE 100 MG PO TABS: 100.0000 mg | ORAL_TABLET | Freq: Two times a day (BID) | ORAL | Status: DC

## 2016-01-09 NOTE — Progress Notes (Signed)

## 2016-01-19 DIAGNOSIS — F39 Unspecified mood [affective] disorder: Secondary | ICD-10-CM | POA: Diagnosis not present

## 2016-01-23 ENCOUNTER — Encounter: Payer: Self-pay | Admitting: *Deleted

## 2016-01-23 ENCOUNTER — Inpatient Hospital Stay: Admission: RE | Admit: 2016-01-23 | Payer: 59 | Source: Ambulatory Visit

## 2016-01-23 NOTE — Patient Instructions (Addendum)
  Your procedure is scheduled on: 02-03-16 (TUESDAY) Report to Same Day Surgery 2nd floor medical mall To find out your arrival time please call [phone] between 1PM - 3PM on 02-02-16 [facility])  Remember: Instructions that are not followed completely may result in serious medical risk, up to and including death, or upon the discretion of your surgeon and anesthesiologist your surgery may need to be rescheduled.    _x___ 1. Do not eat food or drink liquids after midnight. No gum chewing or hard candies.     __x__ 2. No Alcohol for 24 hours before or after surgery.   __x__3. No Smoking for 24 prior to surgery.   ____  4. Bring all medications with you on the day of surgery if instructed.    __x__ 5. Notify your doctor if there is any change in your medical condition     (cold, fever, infections).     Do not wear jewelry, make-up, hairpins, clips or nail polish.  Do not wear lotions, powders, or perfumes. You may wear deodorant.  Do not shave 48 hours prior to surgery. Men may shave face and neck.  Do not bring valuables to the hospital.    [facility] is not responsible for any belongings or valuables.               Contacts, dentures or bridgework may not be worn into surgery.  Leave your suitcase in the car. After surgery it may be brought to your room.  For patients admitted to the hospital, discharge time is determined by your treatment team.   Patients discharged the day of surgery will not be allowed to drive home.    Please read over the following fact sheets that you were given:   [facility] Preparing for Surgery and or MRSA Information   _x___ Take these medicines the morning of surgery with A SIP OF WATER:    1. SYNTHROID  2.  3.   4.  5.  6.  ____ Fleet Enema (as directed)   _x___ Use CHG Soap or sage wipes as directed on instruction sheet   ____ Use inhalers on the day of surgery and bring to hospital day of surgery  ____ Stop metformin 2 days prior to  surgery    ____ Take 1/2 of usual insulin dose the night before surgery and none on the morning of surgery.   ____ Stop aspirin or coumadin, or plavix  _x__ Stop Anti-inflammatories such as Advil, Aleve, Ibuprofen, Motrin, Naproxen,          Naprosyn, Goodies powders or aspirin products 7 DAYS PRIOR TO SURGERY- Ok to take Tylenol.   _X___ Stop supplements until after surgery-STOP PROBIOTIC AND BIOTIN 7 DAYS PRIOR TO SURGERY   ____ Bring C-Pap to the hospital.

## 2016-01-26 ENCOUNTER — Encounter
Admission: RE | Admit: 2016-01-26 | Discharge: 2016-01-26 | Disposition: A | Discharge status: Home / Self Care | Payer: 59 | Source: Ambulatory Visit | Attending: Bariatrics | Admitting: Bariatrics

## 2016-01-26 DIAGNOSIS — I10 Essential (primary) hypertension: Secondary | ICD-10-CM | POA: Diagnosis not present

## 2016-01-26 DIAGNOSIS — Z01812 Encounter for preprocedural laboratory examination: Secondary | ICD-10-CM | POA: Insufficient documentation

## 2016-01-26 LAB — SURGICAL PCR SCREEN
MRSA, PCR: NEGATIVE
STAPHYLOCOCCUS AUREUS: NEGATIVE

## 2016-01-26 LAB — CBC
HEMATOCRIT: 36.7 % (ref 35.0–47.0)
Hemoglobin: 12.5 g/dL (ref 12.0–16.0)
MCH: 28.7 pg (ref 26.0–34.0)
MCHC: 34 g/dL (ref 32.0–36.0)
MCV: 84.4 fL (ref 80.0–100.0)
PLATELETS: 407 10*3/uL (ref 150–440)
RBC: 4.35 MIL/uL (ref 3.80–5.20)
RDW: 13.9 % (ref 11.5–14.5)
WBC: 7.4 10*3/uL (ref 3.6–11.0)

## 2016-01-26 LAB — POTASSIUM: POTASSIUM: 3.4 mmol/L — AB (ref 3.5–5.1)

## 2016-01-28 ENCOUNTER — Ambulatory Visit (INDEPENDENT_AMBULATORY_CARE_PROVIDER_SITE_OTHER): Payer: 59

## 2016-01-28 ENCOUNTER — Encounter: Payer: Self-pay | Admitting: Podiatry

## 2016-01-28 ENCOUNTER — Ambulatory Visit (INDEPENDENT_AMBULATORY_CARE_PROVIDER_SITE_OTHER): Payer: 59 | Admitting: Podiatry

## 2016-01-28 DIAGNOSIS — M779 Enthesopathy, unspecified: Secondary | ICD-10-CM

## 2016-01-28 DIAGNOSIS — F39 Unspecified mood [affective] disorder: Secondary | ICD-10-CM | POA: Diagnosis not present

## 2016-01-28 NOTE — Progress Notes (Signed)
She presents today for follow-up of her capsulitis third metatarsophalangeal joint of the left foot. She states that she has some tenderness below the second and first metatarsophalangeal joints of the left foot. Most significantly she now has pain around the second metatarsophalangeal joint of the right foot. She states this only been bothering her for the past couple weeks it seems to be getting worse as time passes. States that the third metatarsophalangeal joint of the left foot is improving after the injection.  Objective: Vital signs are stable she is alert and oriented 3 mental pain and swelling of the third metatarsophalangeal joint and no tenderness on range of motion. She does however have tenderness on range of motion of the second metatarsophalangeal joint of the right foot and radiographs confirm soft tissue increase in density around the second metatarsophalangeal joint in a slightly plantarflexed third metatarsal of the right foot.  Assessment: Resolving capsulitis third metatarsophalangeal joint left foot capsulitis second metatarsophalangeal joint right foot.  Plan: I injected Kenalog around the second metatarsophalangeal joint of the right foot. I will follow-up with her in the near future.

## 2016-02-02 ENCOUNTER — Encounter: Payer: Self-pay | Admitting: *Deleted

## 2016-02-02 DIAGNOSIS — Z1231 Encounter for screening mammogram for malignant neoplasm of breast: Secondary | ICD-10-CM | POA: Diagnosis not present

## 2016-02-03 ENCOUNTER — Encounter: Payer: Self-pay | Admitting: Family Medicine

## 2016-02-03 ENCOUNTER — Ambulatory Visit: Admission: RE | Admit: 2016-02-03 | Payer: 59 | Source: Ambulatory Visit | Admitting: Bariatrics

## 2016-02-03 DIAGNOSIS — K210 Gastro-esophageal reflux disease with esophagitis: Secondary | ICD-10-CM | POA: Diagnosis not present

## 2016-02-03 DIAGNOSIS — R112 Nausea with vomiting, unspecified: Secondary | ICD-10-CM | POA: Diagnosis not present

## 2016-02-03 DIAGNOSIS — R12 Heartburn: Secondary | ICD-10-CM | POA: Diagnosis not present

## 2016-02-03 DIAGNOSIS — I10 Essential (primary) hypertension: Secondary | ICD-10-CM | POA: Diagnosis not present

## 2016-02-03 DIAGNOSIS — K219 Gastro-esophageal reflux disease without esophagitis: Secondary | ICD-10-CM | POA: Diagnosis not present

## 2016-02-03 DIAGNOSIS — K449 Diaphragmatic hernia without obstruction or gangrene: Secondary | ICD-10-CM | POA: Diagnosis not present

## 2016-02-03 HISTORY — PX: HIATAL HERNIA REPAIR: SHX195

## 2016-02-03 HISTORY — DX: Other specified postprocedural states: R11.2

## 2016-02-03 HISTORY — DX: Hypothyroidism, unspecified: E03.9

## 2016-02-03 HISTORY — DX: Personal history of Methicillin resistant Staphylococcus aureus infection: Z86.14

## 2016-02-03 HISTORY — DX: Essential (primary) hypertension: I10

## 2016-02-03 HISTORY — DX: Nausea with vomiting, unspecified: Z98.890

## 2016-02-03 HISTORY — DX: Gastro-esophageal reflux disease without esophagitis: K21.9

## 2016-02-03 HISTORY — DX: Adverse effect of unspecified anesthetic, initial encounter: T41.45XA

## 2016-02-03 HISTORY — DX: Anemia, unspecified: D64.9

## 2016-02-03 HISTORY — DX: Other complications of anesthesia, initial encounter: T88.59XA

## 2016-02-03 SURGERY — FUNDOPLICATION, NISSEN, LAPAROSCOPIC
Anesthesia: Choice

## 2016-02-03 MED ORDER — CLINDAMYCIN PHOSPHATE 900 MG/50ML IV SOLN: 900.0000 mg | Freq: Once | INTRAVENOUS | Status: DC

## 2016-02-04 DIAGNOSIS — R12 Heartburn: Secondary | ICD-10-CM | POA: Diagnosis not present

## 2016-02-04 DIAGNOSIS — R112 Nausea with vomiting, unspecified: Secondary | ICD-10-CM | POA: Diagnosis not present

## 2016-02-04 DIAGNOSIS — K449 Diaphragmatic hernia without obstruction or gangrene: Secondary | ICD-10-CM | POA: Diagnosis not present

## 2016-02-04 DIAGNOSIS — I10 Essential (primary) hypertension: Secondary | ICD-10-CM | POA: Diagnosis not present

## 2016-02-04 DIAGNOSIS — K210 Gastro-esophageal reflux disease with esophagitis: Secondary | ICD-10-CM | POA: Diagnosis not present

## 2016-02-10 ENCOUNTER — Other Ambulatory Visit: Payer: Self-pay | Admitting: Bariatrics

## 2016-02-10 ENCOUNTER — Ambulatory Visit
Admission: RE | Admit: 2016-02-10 | Discharge: 2016-02-10 | Disposition: A | Discharge status: Home / Self Care | Payer: 59 | Source: Ambulatory Visit | Attending: Physician Assistant | Admitting: Physician Assistant

## 2016-02-10 ENCOUNTER — Other Ambulatory Visit: Payer: Self-pay | Admitting: Physician Assistant

## 2016-02-10 ENCOUNTER — Ambulatory Visit
Admission: RE | Admit: 2016-02-10 | Discharge: 2016-02-10 | Disposition: A | Discharge status: Home / Self Care | Payer: 59 | Source: Ambulatory Visit | Attending: Bariatrics | Admitting: Bariatrics

## 2016-02-10 DIAGNOSIS — G8918 Other acute postprocedural pain: Secondary | ICD-10-CM

## 2016-02-10 DIAGNOSIS — M25512 Pain in left shoulder: Secondary | ICD-10-CM

## 2016-02-10 DIAGNOSIS — J939 Pneumothorax, unspecified: Secondary | ICD-10-CM

## 2016-02-10 DIAGNOSIS — R079 Chest pain, unspecified: Secondary | ICD-10-CM | POA: Diagnosis not present

## 2016-02-10 DIAGNOSIS — J9811 Atelectasis: Secondary | ICD-10-CM | POA: Diagnosis not present

## 2016-02-10 DIAGNOSIS — K219 Gastro-esophageal reflux disease without esophagitis: Secondary | ICD-10-CM | POA: Diagnosis not present

## 2016-02-10 LAB — POCT I-STAT CREATININE: CREATININE: 0.9 mg/dL (ref 0.44–1.00)

## 2016-02-10 MED ORDER — IOPAMIDOL (ISOVUE-370) INJECTION 76%
75.0000 mL | Freq: Once | INTRAVENOUS | Status: AC | PRN
  Administered 2016-02-10: 75 mL via INTRAVENOUS

## 2016-02-13 DIAGNOSIS — E063 Autoimmune thyroiditis: Secondary | ICD-10-CM | POA: Diagnosis not present

## 2016-02-13 DIAGNOSIS — Z8349 Family history of other endocrine, nutritional and metabolic diseases: Secondary | ICD-10-CM | POA: Diagnosis not present

## 2016-02-20 DIAGNOSIS — F39 Unspecified mood [affective] disorder: Secondary | ICD-10-CM | POA: Diagnosis not present

## 2016-02-20 DIAGNOSIS — F902 Attention-deficit hyperactivity disorder, combined type: Secondary | ICD-10-CM | POA: Diagnosis not present

## 2016-02-25 DIAGNOSIS — F39 Unspecified mood [affective] disorder: Secondary | ICD-10-CM | POA: Diagnosis not present

## 2016-03-09 DIAGNOSIS — F39 Unspecified mood [affective] disorder: Secondary | ICD-10-CM | POA: Diagnosis not present

## 2016-03-24 DIAGNOSIS — F39 Unspecified mood [affective] disorder: Secondary | ICD-10-CM | POA: Diagnosis not present

## 2016-03-31 ENCOUNTER — Ambulatory Visit: Payer: 59 | Admitting: Podiatry

## 2016-04-07 DIAGNOSIS — F39 Unspecified mood [affective] disorder: Secondary | ICD-10-CM | POA: Diagnosis not present

## 2016-04-16 DIAGNOSIS — F902 Attention-deficit hyperactivity disorder, combined type: Secondary | ICD-10-CM | POA: Diagnosis not present

## 2016-04-16 DIAGNOSIS — F39 Unspecified mood [affective] disorder: Secondary | ICD-10-CM | POA: Diagnosis not present

## 2016-04-19 ENCOUNTER — Encounter: Payer: Self-pay | Admitting: Podiatry

## 2016-04-19 ENCOUNTER — Ambulatory Visit (INDEPENDENT_AMBULATORY_CARE_PROVIDER_SITE_OTHER): Payer: 59 | Admitting: Podiatry

## 2016-04-19 DIAGNOSIS — M779 Enthesopathy, unspecified: Secondary | ICD-10-CM

## 2016-04-20 NOTE — Progress Notes (Signed)
She presents today for follow-up of her capsulitis third metatarsophalangeal joint left second metatarsophalangeal joint right. She states that the right cramps some when she exercises. Other than that she states she is doing very well.  Objective: Vital signs are stable alert and oriented 3. Pulses are palpable. She has no pain on palpation of the right foot however she still has pain on palpation to the third metatarsophalangeal joint of the left foot with an obvious plantarflexed third metatarsal.  Assessment: Plantarflexed third metatarsal left. This is resulting in chronic capsulitis.  Plan: We did discuss the need for surgical intervention within the next year or so she understands this and is amenable to it and I will follow-up with her on an as-needed basis.

## 2016-04-21 DIAGNOSIS — F39 Unspecified mood [affective] disorder: Secondary | ICD-10-CM | POA: Diagnosis not present

## 2016-04-23 DIAGNOSIS — I10 Essential (primary) hypertension: Secondary | ICD-10-CM | POA: Diagnosis not present

## 2016-04-23 DIAGNOSIS — Z6831 Body mass index (BMI) 31.0-31.9, adult: Secondary | ICD-10-CM | POA: Diagnosis not present

## 2016-04-23 DIAGNOSIS — E6609 Other obesity due to excess calories: Secondary | ICD-10-CM | POA: Diagnosis not present

## 2016-04-26 ENCOUNTER — Telehealth: Payer: Self-pay | Admitting: *Deleted

## 2016-04-26 NOTE — Telephone Encounter (Signed)
"  I am a patient of Dr. Milinda Pointer.  I have a question in regards to setting up surgery.

## 2016-04-28 ENCOUNTER — Other Ambulatory Visit: Payer: Self-pay | Admitting: Cardiology

## 2016-04-28 ENCOUNTER — Other Ambulatory Visit: Payer: Self-pay | Admitting: Bariatrics

## 2016-04-28 DIAGNOSIS — R0602 Shortness of breath: Secondary | ICD-10-CM

## 2016-04-28 DIAGNOSIS — R0789 Other chest pain: Secondary | ICD-10-CM

## 2016-04-29 ENCOUNTER — Telehealth: Payer: Self-pay | Admitting: *Deleted

## 2016-04-29 NOTE — Telephone Encounter (Signed)
"  I am sorry to bother you again.  Is there anyway possible you can put me on a waiting list for November 17 just in case there are any cancellations?"  Yes, I can but be aware there are others that have requested to be on the waiting list already for that date.  "Okay, thank you so much."

## 2016-04-29 NOTE — Telephone Encounter (Signed)
"  I am a patient of Dr. Milinda Pointer.  I am scheduled to see him on November 1 to discuss surgery.  I was wondering if there is any way I could go ahead and get a date for surgery."   I can tentatively put you down for a date.  "Okay, can I do it on November 17?"  No, that date is not available.  "Well how about December 1 or 8 is either date available?"  He can do it on December 1.  "Okay if you can put me down then that will be great.  Will I need to call you or anything after I see him?"  No, they will give me your paperwork after your consultation appointment.  "Okay great, thank you so much."

## 2016-05-05 DIAGNOSIS — F39 Unspecified mood [affective] disorder: Secondary | ICD-10-CM | POA: Diagnosis not present

## 2016-05-06 ENCOUNTER — Ambulatory Visit
Admission: RE | Admit: 2016-05-06 | Discharge: 2016-05-06 | Disposition: A | Discharge status: Home / Self Care | Payer: 59 | Source: Ambulatory Visit | Attending: Bariatrics | Admitting: Bariatrics

## 2016-05-06 DIAGNOSIS — Z9889 Other specified postprocedural states: Secondary | ICD-10-CM | POA: Insufficient documentation

## 2016-05-06 DIAGNOSIS — R1310 Dysphagia, unspecified: Secondary | ICD-10-CM | POA: Diagnosis not present

## 2016-05-06 DIAGNOSIS — K449 Diaphragmatic hernia without obstruction or gangrene: Secondary | ICD-10-CM | POA: Diagnosis not present

## 2016-05-06 DIAGNOSIS — F39 Unspecified mood [affective] disorder: Secondary | ICD-10-CM | POA: Diagnosis not present

## 2016-05-06 DIAGNOSIS — F902 Attention-deficit hyperactivity disorder, combined type: Secondary | ICD-10-CM | POA: Diagnosis not present

## 2016-05-12 ENCOUNTER — Ambulatory Visit (INDEPENDENT_AMBULATORY_CARE_PROVIDER_SITE_OTHER): Payer: 59 | Admitting: Podiatry

## 2016-05-12 ENCOUNTER — Telehealth: Payer: Self-pay | Admitting: *Deleted

## 2016-05-12 DIAGNOSIS — M779 Enthesopathy, unspecified: Secondary | ICD-10-CM

## 2016-05-12 NOTE — Progress Notes (Signed)
She presents today for a surgical consult regarding her painful third metatarsal of her left foot. We previously diagnosed her with a plantarflexed third metatarsal resulting in metatarsalgia.  Objective: No signs are stable she is alert and oriented 3. Pulses are palpable. Neurologic sensorium is intact. Plantar flexed third metatarsal with a very prominent flex hip.  Assessment: Pain and limb secondary to plantarflexed third metatarsal left.  Plan: Consented her today for a surgical third third metatarsal osteotomy with screw fixation. I answered all questions regarding this procedure best mobility in layman's terms. She understood it was amenable to it and signed all 3 patient the consent form.

## 2016-05-12 NOTE — Patient Instructions (Signed)

## 2016-05-12 NOTE — Telephone Encounter (Signed)
"  You have me tentatively scheduled to have surgery on December 1.  I just saw Dr. Milinda Pointer and he has all of the signed forms.  Is there anything else you need from me?"  I don't need anything else.  "So you are going to check my insurance and let me know if there is a problem?"  Yes, I will let you know.

## 2016-05-13 ENCOUNTER — Other Ambulatory Visit
Admit: 2016-05-13 | Discharge: 2016-05-13 | Disposition: A | Discharge status: Home / Self Care | Payer: 59 | Attending: Cardiology | Admitting: Cardiology

## 2016-05-13 ENCOUNTER — Other Ambulatory Visit: Payer: Self-pay | Admitting: Cardiology

## 2016-05-13 DIAGNOSIS — T66XXXA Radiation sickness, unspecified, initial encounter: Secondary | ICD-10-CM | POA: Diagnosis not present

## 2016-05-13 LAB — PREGNANCY, URINE: PREG TEST UR: NEGATIVE

## 2016-05-14 ENCOUNTER — Encounter
Admission: RE | Admit: 2016-05-14 | Discharge: 2016-05-14 | Disposition: A | Discharge status: Home / Self Care | Payer: 59 | Source: Ambulatory Visit | Attending: Cardiology | Admitting: Cardiology

## 2016-05-14 DIAGNOSIS — R0789 Other chest pain: Secondary | ICD-10-CM | POA: Insufficient documentation

## 2016-05-14 DIAGNOSIS — R0602 Shortness of breath: Secondary | ICD-10-CM | POA: Diagnosis not present

## 2016-05-14 MED ORDER — REGADENOSON 0.4 MG/5ML IV SOLN
0.4000 mg | Freq: Once | INTRAVENOUS | Status: AC
  Administered 2016-05-14: 0.4 mg via INTRAVENOUS
  Filled 2016-05-14: qty 5

## 2016-05-14 MED ORDER — TECHNETIUM TC 99M TETROFOSMIN IV KIT
13.0000 | PACK | Freq: Once | INTRAVENOUS | Status: AC | PRN
  Administered 2016-05-14: 11.88 via INTRAVENOUS

## 2016-05-14 MED ORDER — TECHNETIUM TC 99M TETROFOSMIN IV KIT
33.0000 | PACK | Freq: Once | INTRAVENOUS | Status: AC | PRN
  Administered 2016-05-14: 32.95 via INTRAVENOUS

## 2016-05-17 ENCOUNTER — Telehealth: Payer: Self-pay | Admitting: Podiatry

## 2016-05-17 NOTE — Telephone Encounter (Signed)
Patient called Mandy Torres said that Mandy Torres is scheduled for surgery in December, has already signed the consent forms etc. But Mandy Torres has a couple of questions for Dr. Milinda Pointer before Mandy Torres has the procedure done. Would like for Dr. Milinda Pointer to call her back to discuss. Early morning, or late afternoon after 5:00. Mandy Torres works in [employer] as well and wanted to speak with you.

## 2016-05-17 NOTE — Telephone Encounter (Signed)
She is coming in for a discussion.

## 2016-05-24 DIAGNOSIS — M779 Enthesopathy, unspecified: Secondary | ICD-10-CM

## 2016-05-26 ENCOUNTER — Telehealth: Payer: Self-pay | Admitting: Podiatry

## 2016-05-26 NOTE — Telephone Encounter (Signed)
Pt called asking if you would please call her. She is thinking about maybe getting the right foot done also when she has the left foot surgery done if you think she needs it.

## 2016-05-27 DIAGNOSIS — Z1371 Encounter for nonprocreative screening for genetic disease carrier status: Secondary | ICD-10-CM

## 2016-05-27 DIAGNOSIS — Z803 Family history of malignant neoplasm of breast: Secondary | ICD-10-CM | POA: Diagnosis not present

## 2016-05-27 HISTORY — DX: Encounter for nonprocreative screening for genetic disease carrier status: Z13.71

## 2016-05-27 NOTE — Telephone Encounter (Signed)
Opened in error

## 2016-05-28 DIAGNOSIS — F902 Attention-deficit hyperactivity disorder, combined type: Secondary | ICD-10-CM | POA: Diagnosis not present

## 2016-05-28 DIAGNOSIS — F39 Unspecified mood [affective] disorder: Secondary | ICD-10-CM | POA: Diagnosis not present

## 2016-05-31 ENCOUNTER — Telehealth: Payer: Self-pay | Admitting: *Deleted

## 2016-05-31 ENCOUNTER — Telehealth: Payer: Self-pay

## 2016-05-31 NOTE — Telephone Encounter (Signed)
Spoke with patient regarding FMLA status and discussing Surgery on Rt foot, she currently has an appt scheduled on the 27th of this month and will discuss this with Dr Milinda Pointer at that time

## 2016-05-31 NOTE — Telephone Encounter (Signed)
"  I'm scheduled for surgery on December 1.  I am coming in to see him on November 28 to talk about adding a procedure.  Will it be too late to do this on December 1?"  No it will not be a problem.  What are you trying to add.  I can go ahead and add some time on to your procedure so the time will not be taken.  "That is what I was hoping I know it is that time of year and all times are getting taken.  Thank you so much for your help.  You have always been pleasant to work with."  Thank you so much.

## 2016-05-31 NOTE — Telephone Encounter (Signed)
Please let her know that that will be fine, but she should come in and discuss with me and update consent.

## 2016-06-07 ENCOUNTER — Ambulatory Visit (INDEPENDENT_AMBULATORY_CARE_PROVIDER_SITE_OTHER): Payer: 59 | Admitting: Podiatry

## 2016-06-07 DIAGNOSIS — M7750 Other enthesopathy of unspecified foot: Secondary | ICD-10-CM

## 2016-06-07 DIAGNOSIS — M779 Enthesopathy, unspecified: Secondary | ICD-10-CM

## 2016-06-07 DIAGNOSIS — M2011 Hallux valgus (acquired), right foot: Secondary | ICD-10-CM

## 2016-06-07 DIAGNOSIS — M778 Other enthesopathies, not elsewhere classified: Secondary | ICD-10-CM

## 2016-06-07 MED ORDER — HYDROMORPHONE HCL 4 MG PO TABS: 4.0000 mg | ORAL_TABLET | ORAL | 0 refills | Status: DC | PRN

## 2016-06-07 MED ORDER — PROMETHAZINE HCL 25 MG PO TABS: 25.0000 mg | ORAL_TABLET | Freq: Three times a day (TID) | ORAL | 0 refills | Status: DC | PRN

## 2016-06-07 MED ORDER — CLINDAMYCIN HCL 150 MG PO CAPS
150.0000 mg | ORAL_CAPSULE | Freq: Three times a day (TID) | ORAL | 0 refills | Status: DC
Start: 2016-06-07 — End: 2016-11-12

## 2016-06-08 NOTE — Progress Notes (Signed)
At this point she would like to amend her surgical consent. She states that she has now developed worst pain in the first metatarsophalangeal joint of the right foot which we have been treating for the past several months she states it really hasn't gotten better and seems to have gotten worse. She will consider bunion repair on that foot. She is also complaining of pain to the second interdigital space of the right foot. Left foot demonstrates pain around the first and second metatarsophalangeal joint and we are correcting the third metatarsophalangeal joint already.  Objective: Vital signs are stable alert and oriented 3 have reviewed her past medical history medications allergy surgeries social history once again. Pulses remain palpable. She has pain on range of motion and on palpation of the first metatarsophalangeal joint of the right foot. Radiographs reviewed from July demonstrate soft tissue increase in density around the sesamoids consistent with sesamoiditis and increase in the first intermetatarsal angle consistent with bunion deformity. She does have an elongated second metatarsal which really at this time is not symptomatically other than tenderness within the joint. I think correction of the bunion deformity will help with pain in the second metatarsal. She is also having pain of the plantar aspect of the first metatarsophalangeal joint of the left foot which was previously corrected. She also has tenderness around the distal second metatarsal. Radiographs reviewed did demonstrate retention of internal fixation. She has an elevated fourth toe left foot contracted with the extensor tendon. She also has pain at the level of the third metatarsophalangeal joint which is what we were going to correct initially.  Assessment: Capsulitis of the second metatarsophalangeal joint right bunion deformity right foot. Painful internal fixation first metatarsal and second metatarsal left foot. Plantarflexed third  metatarsal left foot. Contracted fourth toe left foot.  Plan: Discussed etiology pathology concerned versus surgical therapies we addended the consent today to demonstrate the additions of an [facility] bunion repair right foot with screw and injection of the second metatarsophalangeal joint right foot. We also added removal of internal fixation first and second metatarsals left foot. We will continue with the third metatarsal osteotomy as planned and we also added a tenotomy and a metatarsophalangeal joint release fourth digit left. I answered all the questions regarding these procedures best of my ability in layman's terms. She understands and is amenable to it. I will follow-up with her in the near future for surgical intervention.

## 2016-06-11 ENCOUNTER — Encounter: Payer: Self-pay | Admitting: Podiatry

## 2016-06-11 DIAGNOSIS — M779 Enthesopathy, unspecified: Secondary | ICD-10-CM | POA: Diagnosis not present

## 2016-06-11 DIAGNOSIS — M24575 Contracture, left foot: Secondary | ICD-10-CM | POA: Diagnosis not present

## 2016-06-11 DIAGNOSIS — T8484XA Pain due to internal orthopedic prosthetic devices, implants and grafts, initial encounter: Secondary | ICD-10-CM | POA: Diagnosis not present

## 2016-06-11 DIAGNOSIS — M25571 Pain in right ankle and joints of right foot: Secondary | ICD-10-CM | POA: Diagnosis not present

## 2016-06-11 DIAGNOSIS — E039 Hypothyroidism, unspecified: Secondary | ICD-10-CM | POA: Diagnosis not present

## 2016-06-11 DIAGNOSIS — M216X2 Other acquired deformities of left foot: Secondary | ICD-10-CM | POA: Diagnosis not present

## 2016-06-11 DIAGNOSIS — M2042 Other hammer toe(s) (acquired), left foot: Secondary | ICD-10-CM | POA: Diagnosis not present

## 2016-06-11 DIAGNOSIS — M2021 Hallux rigidus, right foot: Secondary | ICD-10-CM | POA: Diagnosis not present

## 2016-06-11 DIAGNOSIS — L923 Foreign body granuloma of the skin and subcutaneous tissue: Secondary | ICD-10-CM | POA: Diagnosis not present

## 2016-06-11 DIAGNOSIS — M21542 Acquired clubfoot, left foot: Secondary | ICD-10-CM | POA: Diagnosis not present

## 2016-06-11 DIAGNOSIS — M2011 Hallux valgus (acquired), right foot: Secondary | ICD-10-CM | POA: Diagnosis not present

## 2016-06-11 HISTORY — PX: OTHER SURGICAL HISTORY: SHX169

## 2016-06-14 ENCOUNTER — Telehealth: Payer: Self-pay | Admitting: *Deleted

## 2016-06-14 NOTE — Telephone Encounter (Signed)
Patient called on Friday asking where does the ice pack go and I stated that it is to be placed behind the knee and could switch the ice packs from one knee to the other  due to patient having surgery on both feet. Stated to patient to call the office if any concerns or questions. Lattie Haw

## 2016-06-16 ENCOUNTER — Ambulatory Visit: Payer: 59

## 2016-06-16 ENCOUNTER — Ambulatory Visit (INDEPENDENT_AMBULATORY_CARE_PROVIDER_SITE_OTHER): Payer: 59

## 2016-06-16 ENCOUNTER — Ambulatory Visit (INDEPENDENT_AMBULATORY_CARE_PROVIDER_SITE_OTHER): Payer: 59 | Admitting: Podiatry

## 2016-06-16 VITALS — BP 112/68 | Temp 97.8°F

## 2016-06-16 DIAGNOSIS — M79672 Pain in left foot: Secondary | ICD-10-CM | POA: Diagnosis not present

## 2016-06-16 DIAGNOSIS — M216X2 Other acquired deformities of left foot: Secondary | ICD-10-CM

## 2016-06-16 DIAGNOSIS — Z9889 Other specified postprocedural states: Secondary | ICD-10-CM

## 2016-06-16 DIAGNOSIS — R52 Pain, unspecified: Secondary | ICD-10-CM

## 2016-06-16 DIAGNOSIS — M79671 Pain in right foot: Secondary | ICD-10-CM | POA: Diagnosis not present

## 2016-06-16 DIAGNOSIS — M2011 Hallux valgus (acquired), right foot: Secondary | ICD-10-CM

## 2016-06-16 DIAGNOSIS — T847XXD Infection and inflammatory reaction due to other internal orthopedic prosthetic devices, implants and grafts, subsequent encounter: Secondary | ICD-10-CM

## 2016-06-16 NOTE — Progress Notes (Signed)
She presents today 5 days status post [facility] bunion repair right foot injection second metatarsophalangeal joint right foot. She is also status post removal internal fixation first and second metatarsal with a third metatarsal osteotomy. She states that she is doing very well that she does have some tenderness about the first metatarsophalangeal joint right foot.  Objective: Vital signs are stable alert and oriented 3. Pulses are palpable. Neurologic sensorium is intact. Degenerative flexors are intact. Muscle strength is normal. Orthopedic evaluation demonstrates well-healing first metatarsophalangeal joint of the right foot. Great range of motion. Left foot appears to be in good condition as well some mild edema about the left foot. Radiographs of bilateral foot taken today demonstrate distal osteotomy first metatarsal right. Third metatarsal osteotomy with screw left. All sutures are intact margins well coapted.  Assessment: Well-healing surgical foot bilateral.  Plan: Encouraged range of motion exercises redress today dresser compressive dressing continued elevated and dry. Follow-up with me as needed. I will follow-up with her in 1 week for suture removal otherwise.

## 2016-06-17 ENCOUNTER — Telehealth: Payer: Self-pay | Admitting: *Deleted

## 2016-06-17 DIAGNOSIS — F4010 Social phobia, unspecified: Secondary | ICD-10-CM | POA: Diagnosis not present

## 2016-06-17 DIAGNOSIS — F900 Attention-deficit hyperactivity disorder, predominantly inattentive type: Secondary | ICD-10-CM | POA: Diagnosis not present

## 2016-06-17 DIAGNOSIS — F819 Developmental disorder of scholastic skills, unspecified: Secondary | ICD-10-CM | POA: Diagnosis not present

## 2016-06-17 DIAGNOSIS — F39 Unspecified mood [affective] disorder: Secondary | ICD-10-CM | POA: Diagnosis not present

## 2016-06-17 DIAGNOSIS — F605 Obsessive-compulsive personality disorder: Secondary | ICD-10-CM | POA: Diagnosis not present

## 2016-06-17 NOTE — Telephone Encounter (Signed)
Notified patient pt of genetic testing results, Ok per Dr Bary Castilla, no further treatment at this time, pt pleased.

## 2016-06-21 ENCOUNTER — Encounter: Payer: Self-pay | Admitting: General Surgery

## 2016-06-23 ENCOUNTER — Ambulatory Visit (INDEPENDENT_AMBULATORY_CARE_PROVIDER_SITE_OTHER): Payer: 59 | Admitting: Podiatry

## 2016-06-23 ENCOUNTER — Encounter: Payer: Self-pay | Admitting: Podiatry

## 2016-06-23 DIAGNOSIS — M216X2 Other acquired deformities of left foot: Secondary | ICD-10-CM

## 2016-06-23 DIAGNOSIS — M2011 Hallux valgus (acquired), right foot: Secondary | ICD-10-CM | POA: Diagnosis not present

## 2016-06-23 DIAGNOSIS — T847XXD Infection and inflammatory reaction due to other internal orthopedic prosthetic devices, implants and grafts, subsequent encounter: Secondary | ICD-10-CM

## 2016-06-23 DIAGNOSIS — Z9889 Other specified postprocedural states: Secondary | ICD-10-CM

## 2016-06-23 NOTE — Progress Notes (Signed)
She presents today 2 weeks status post [facility] bunion repair right foot third metatarsal osteotomy left foot and removal of internal fixation first and second metatarsals left foot. She states that she is doing good she has very little in the way of pain. She denies any calf pain shortness of breath chest pain.  Objective: Vital signs are stable she is alert and oriented 3 dressing dressings was removed demonstrate sutures are intact margins are well coapted. She has great range of motion first metatarsophalangeal joint right foot. No pain on palpation third metatarsal left. No signs of dehiscence or signs of infection on edema no cellulitis drainage or odor.  Assessment: Well-healing surgical foot bilateral.  Plan: Place her compression anklet's and an additional Darco shoe will allow her to start getting this wet encouraged range of motion exercises will follow up with her in 2-3 weeks.

## 2016-06-25 NOTE — Progress Notes (Signed)
DOS 12.01.2017 3rd Metatarsal Osteotomy with Screw Left Foot; Removal Internal Fixation (screws) 1st and 2nd Metatarsal Left; (Addendum: add to original consent) [facility] Repair with Screw Right; Injection 2nd MTPj Right; Tenotomy Extension 4th Left

## 2016-07-07 ENCOUNTER — Ambulatory Visit (INDEPENDENT_AMBULATORY_CARE_PROVIDER_SITE_OTHER): Payer: Self-pay | Admitting: Podiatry

## 2016-07-07 ENCOUNTER — Encounter: Payer: Self-pay | Admitting: Podiatry

## 2016-07-07 ENCOUNTER — Ambulatory Visit: Payer: 59

## 2016-07-07 DIAGNOSIS — M216X2 Other acquired deformities of left foot: Secondary | ICD-10-CM

## 2016-07-07 DIAGNOSIS — T847XXD Infection and inflammatory reaction due to other internal orthopedic prosthetic devices, implants and grafts, subsequent encounter: Secondary | ICD-10-CM

## 2016-07-07 DIAGNOSIS — M2011 Hallux valgus (acquired), right foot: Secondary | ICD-10-CM

## 2016-07-07 DIAGNOSIS — Z9889 Other specified postprocedural states: Secondary | ICD-10-CM

## 2016-07-07 NOTE — Progress Notes (Signed)
She presents today status post [facility] bunion repair right foot. Third metatarsal osteotomy left foot. Removal of internal fixation first and second toes left. She states that she's doing pretty good but she is getting tired of sitting. She has already returned to work.  Objective: Vital signs are stable she is alert and oriented 3. Pulses are palpable. She has great range of motion all surgical sites. Minimal edema. Radiographs demonstrate well-healing surgical foot bilateral.  Assessment: Well-healing surgical foot date of surgery 06/11/2016.  Plan: I encouraged her to get back into regular tennis shoes over the next couple of weeks and that I will follow-up with her in 1 month. She will call with questions or concerns.

## 2016-07-14 DIAGNOSIS — F39 Unspecified mood [affective] disorder: Secondary | ICD-10-CM | POA: Diagnosis not present

## 2016-07-23 DIAGNOSIS — F39 Unspecified mood [affective] disorder: Secondary | ICD-10-CM | POA: Diagnosis not present

## 2016-07-23 DIAGNOSIS — F902 Attention-deficit hyperactivity disorder, combined type: Secondary | ICD-10-CM | POA: Diagnosis not present

## 2016-08-04 ENCOUNTER — Encounter: Payer: Self-pay | Admitting: Podiatry

## 2016-08-04 ENCOUNTER — Ambulatory Visit (INDEPENDENT_AMBULATORY_CARE_PROVIDER_SITE_OTHER): Payer: 59

## 2016-08-04 ENCOUNTER — Ambulatory Visit (INDEPENDENT_AMBULATORY_CARE_PROVIDER_SITE_OTHER): Payer: Self-pay | Admitting: Podiatry

## 2016-08-04 DIAGNOSIS — M2011 Hallux valgus (acquired), right foot: Secondary | ICD-10-CM

## 2016-08-04 DIAGNOSIS — Z9889 Other specified postprocedural states: Secondary | ICD-10-CM

## 2016-08-04 DIAGNOSIS — M216X2 Other acquired deformities of left foot: Secondary | ICD-10-CM

## 2016-08-04 NOTE — Progress Notes (Signed)
She presents today for her 2 month follow-up visit status post metatarsal osteotomy third left removal of deep internal fixation first and second metatarsals left foot with an [facility] bunionectomy right foot. She states that I have still have some tenderness many third knuckle over here she points to the left foot.  Objective: Vital signs are stable alert and oriented 3. Pulses are palpable. Neurologic sensorium is intact. She still has swelling to the left foot she return to work 1-1/2 weeks after surgery. Her feet remain dependent with edema. She is working light duty try to keep feet elevated as much as possible. But she still has swelling. Prominent feeling third metatarsophalangeal joint should resolve once all of the edema has left. She still has some tenderness on palpation and range of motion of the first metatarsophalangeal joint left possibly associated with the excessive edema or osteoarthritic changes of the tibial sesamoid. Right foot appears to be healing well. Radiographs taken today demonstrate well-healing surgical foot bilateral.  Assessment: Well-healing surgical foot bilateral. Some residual tenderness associated with edema.  Plan: I encouraged massage therapy and stretching exercises the use of digital night splints to the toes. She understands this is amenable to it and I will follow-up with her in 1 month.

## 2016-08-06 DIAGNOSIS — E063 Autoimmune thyroiditis: Secondary | ICD-10-CM | POA: Diagnosis not present

## 2016-08-11 DIAGNOSIS — F39 Unspecified mood [affective] disorder: Secondary | ICD-10-CM | POA: Diagnosis not present

## 2016-08-12 LAB — NM MYOCAR MULTI W/SPECT W/WALL MOTION / EF
CHL CUP MPHR: 173 {beats}/min
CHL CUP NUCLEAR SRS: 0
CHL CUP NUCLEAR SSS: 0
CHL CUP RESTING HR STRESS: 67 {beats}/min
CHL CUP STRESS STAGE 1 HR: 68 {beats}/min
CHL CUP STRESS STAGE 2 HR: 100 {beats}/min
CHL CUP STRESS STAGE 3 GRADE: 0 %
CHL CUP STRESS STAGE 3 SPEED: 0 mph
CSEPED: 1 min
CSEPEDS: 1 s
CSEPEW: 1 METS
CSEPPHR: 100 {beats}/min
LV dias vol: 53 mL (ref 46–106)
LVSYSVOL: 18 mL
Percent HR: 58 %
Percent of predicted max HR: 57 %
SDS: 0
Stage 2 Grade: 0 %
Stage 2 Speed: 0 mph
Stage 3 DBP: 46 mmHg
Stage 3 HR: 87 {beats}/min
Stage 3 SBP: 96 mmHg
TID: 1

## 2016-08-25 DIAGNOSIS — F39 Unspecified mood [affective] disorder: Secondary | ICD-10-CM | POA: Diagnosis not present

## 2016-09-08 ENCOUNTER — Encounter: Payer: 59 | Admitting: Podiatry

## 2016-09-15 ENCOUNTER — Encounter: Payer: Self-pay | Admitting: Podiatry

## 2016-09-15 ENCOUNTER — Ambulatory Visit (INDEPENDENT_AMBULATORY_CARE_PROVIDER_SITE_OTHER): Payer: 59 | Admitting: Podiatry

## 2016-09-15 ENCOUNTER — Ambulatory Visit (INDEPENDENT_AMBULATORY_CARE_PROVIDER_SITE_OTHER): Payer: 59

## 2016-09-15 DIAGNOSIS — M2011 Hallux valgus (acquired), right foot: Secondary | ICD-10-CM

## 2016-09-15 DIAGNOSIS — M216X2 Other acquired deformities of left foot: Secondary | ICD-10-CM

## 2016-09-15 DIAGNOSIS — T847XXD Infection and inflammatory reaction due to other internal orthopedic prosthetic devices, implants and grafts, subsequent encounter: Secondary | ICD-10-CM | POA: Diagnosis not present

## 2016-09-15 NOTE — Progress Notes (Signed)
She presents today for a postop visit date of surgery 07/13/2015 status post [facility] bunion repair right third metatarsal osteotomy left removal internal fixation first and second metatarsals of the left foot. She states that everything seems to be doing better.  Objective: Vital signs are stable she is alert and oriented 3. She has great range of motion first metatarsal phalangeal joints bilateral third left. Radiographs taken today demonstrate well-healing internal fixation and osteotomies first right and third left.  Assessment well-healing surgical foot bilateral.  Plan: Give back to her regular routine follow-up with [facility].

## 2016-09-24 DIAGNOSIS — F902 Attention-deficit hyperactivity disorder, combined type: Secondary | ICD-10-CM | POA: Diagnosis not present

## 2016-09-24 DIAGNOSIS — F39 Unspecified mood [affective] disorder: Secondary | ICD-10-CM | POA: Diagnosis not present

## 2016-10-20 DIAGNOSIS — F39 Unspecified mood [affective] disorder: Secondary | ICD-10-CM | POA: Diagnosis not present

## 2016-10-22 DIAGNOSIS — F39 Unspecified mood [affective] disorder: Secondary | ICD-10-CM | POA: Diagnosis not present

## 2016-10-22 DIAGNOSIS — F902 Attention-deficit hyperactivity disorder, combined type: Secondary | ICD-10-CM | POA: Diagnosis not present

## 2016-11-03 DIAGNOSIS — R1310 Dysphagia, unspecified: Secondary | ICD-10-CM | POA: Diagnosis not present

## 2016-11-03 DIAGNOSIS — K219 Gastro-esophageal reflux disease without esophagitis: Secondary | ICD-10-CM | POA: Diagnosis not present

## 2016-11-03 DIAGNOSIS — F39 Unspecified mood [affective] disorder: Secondary | ICD-10-CM | POA: Diagnosis not present

## 2016-11-08 ENCOUNTER — Other Ambulatory Visit: Payer: Self-pay | Admitting: Bariatrics

## 2016-11-08 DIAGNOSIS — K219 Gastro-esophageal reflux disease without esophagitis: Secondary | ICD-10-CM

## 2016-11-09 ENCOUNTER — Ambulatory Visit: Payer: 59

## 2016-11-12 ENCOUNTER — Ambulatory Visit (INDEPENDENT_AMBULATORY_CARE_PROVIDER_SITE_OTHER): Payer: 59 | Admitting: Family Medicine

## 2016-11-12 ENCOUNTER — Encounter: Payer: Self-pay | Admitting: Family Medicine

## 2016-11-12 VITALS — BP 136/77 | HR 83 | Ht 62.0 in | Wt 162.4 lb

## 2016-11-12 DIAGNOSIS — Z803 Family history of malignant neoplasm of breast: Secondary | ICD-10-CM

## 2016-11-12 DIAGNOSIS — N898 Other specified noninflammatory disorders of vagina: Secondary | ICD-10-CM

## 2016-11-12 DIAGNOSIS — Z124 Encounter for screening for malignant neoplasm of cervix: Secondary | ICD-10-CM | POA: Diagnosis not present

## 2016-11-12 DIAGNOSIS — Z3041 Encounter for surveillance of contraceptive pills: Secondary | ICD-10-CM

## 2016-11-12 DIAGNOSIS — Z01419 Encounter for gynecological examination (general) (routine) without abnormal findings: Secondary | ICD-10-CM | POA: Diagnosis not present

## 2016-11-12 DIAGNOSIS — Z113 Encounter for screening for infections with a predominantly sexual mode of transmission: Secondary | ICD-10-CM | POA: Diagnosis not present

## 2016-11-12 DIAGNOSIS — Z1151 Encounter for screening for human papillomavirus (HPV): Secondary | ICD-10-CM

## 2016-11-12 MED ORDER — NORETHINDRONE ACET-ETHINYL EST 1-20 MG-MCG PO TABS: 1.0000 | ORAL_TABLET | Freq: Every day | ORAL | 6 refills | Status: DC

## 2016-11-12 NOTE — Progress Notes (Signed)
Last Pap 11/2015-Normal Last Mammogram: 06-01/2016 done at [facility]

## 2016-11-12 NOTE — Progress Notes (Signed)
  Subjective:     Mandy Torres is a 46 y.o. female and is here for a comprehensive physical exam. The patient reports no problems. Reports vaginal odor. Denies new sexual partner--? Bacterial vaginosis. 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 On continuous OC's and desires to continue this. Has strong FH of breast cancer. Normal mammogram 6/17. Her BRCA testing is inconclusive.  Social History   Social History  . Marital status: Single    Spouse name: N/A  . Number of children: N/A  . Years of education: N/A   Occupational History  . Not on file.   Social History Main Topics  . Smoking status: Never Smoker  . Smokeless tobacco: Never Used  . Alcohol use No  . Drug use: No  . Sexual activity: Yes    Birth control/ protection: Pill   Other Topics Concern  . Not on file   Social History Narrative   Single   0 children   0 pregnancies    First menstrual cycle: 46 yrs old         Health Maintenance  Topic Date Due  . HIV Screening  03/03/1984  . TETANUS/TDAP  03/03/1988  . INFLUENZA VACCINE  02/09/2017  . PAP SMEAR  11/21/2018    The following portions of the patient's history were reviewed and updated as appropriate: allergies, current medications, past family history, past medical history, past social history, past surgical history and problem list.  Review of Systems Pertinent items noted in HPI and remainder of comprehensive ROS otherwise negative.   Objective:    BP 136/77   Pulse 83   Ht '5\' 2"'$  (1.575 m)   Wt 162 lb 6.4 oz (73.7 kg)   BMI 29.70 kg/m  General appearance: alert, cooperative and appears stated age Head: Normocephalic, without obvious abnormality, atraumatic Neck: no adenopathy, supple, symmetrical, trachea midline and thyroid not enlarged, symmetric, no tenderness/mass/nodules Lungs: clear to auscultation bilaterally Breasts: normal appearance, no masses or tenderness, fibrocystic change Heart: regular rate and rhythm, S1, S2 normal, no  murmur, click, rub or gallop Abdomen: soft, non-tender; bowel sounds normal; no masses,  no organomegaly Pelvic: cervix normal in appearance, external genitalia normal, no adnexal masses or tenderness, no cervical motion tenderness, uterus normal size, shape, and consistency and vagina normal without discharge Extremities: extremities normal, atraumatic, no cyanosis or edema Pulses: 2+ and symmetric Skin: Skin color, texture, turgor normal. No rashes or lesions Lymph nodes: Cervical, supraclavicular, and axillary nodes normal. Neurologic: Grossly normal    Assessment:    Healthy female exam.      Plan:      Problem List Items Addressed This Visit      Unprioritized   Family history of breast cancer    Other Visit Diagnoses    Encounter for gynecological examination without abnormal finding    -  Primary   Relevant Orders   MM DIGITAL SCREENING BILATERAL   TSH   CBC   Comprehensive metabolic panel   Lipid panel   Vitamin D (25 hydroxy)   Encounter for surveillance of contraceptive pills       Relevant Medications   norethindrone-ethinyl estradiol (MICROGESTIN,JUNEL,LOESTRIN) 1-20 MG-MCG tablet   Vaginal odor       Relevant Orders   Cervicovaginal ancillary only   Screening for cervical cancer       Relevant Orders   Cytology - PAP       See After Visit Summary for Counseling Recommendations

## 2016-11-12 NOTE — Patient Instructions (Signed)
Preventive Care 40-64 Years, Female Preventive care refers to lifestyle choices and visits with your health care provider that can promote health and wellness. What does preventive care include?  A yearly physical exam. This is also called an annual well check.  Dental exams once or twice a year.  Routine eye exams. Ask your health care provider how often you should have your eyes checked.  Personal lifestyle choices, including:  Daily care of your teeth and gums.  Regular physical activity.  Eating a healthy diet.  Avoiding tobacco and drug use.  Limiting alcohol use.  Practicing safe sex.  Taking low-dose aspirin daily starting at age 39.  Taking vitamin and mineral supplements as recommended by your health care provider. What happens during an annual well check? The services and screenings done by your health care provider during your annual well check will depend on your age, overall health, lifestyle risk factors, and family history of disease. Counseling  Your health care provider may ask you questions about your:  Alcohol use.  Tobacco use.  Drug use.  Emotional well-being.  Home and relationship well-being.  Sexual activity.  Eating habits.  Work and work Statistician.  Method of birth control.  Menstrual cycle.  Pregnancy history. Screening  You may have the following tests or measurements:  Height, weight, and BMI.  Blood pressure.  Lipid and cholesterol levels. These may be checked every 5 years, or more frequently if you are over 19 years old.  Skin check.  Lung cancer screening. You may have this screening every year starting at age 55 if you have a 30-pack-year history of smoking and currently smoke or have quit within the past 15 years.  Fecal occult blood test (FOBT) of the stool. You may have this test every year starting at age 39.  Flexible sigmoidoscopy or colonoscopy. You may have a sigmoidoscopy every 5 years or a colonoscopy  every 10 years starting at age 33.  Hepatitis C blood test.  Hepatitis B blood test.  Sexually transmitted disease (STD) testing.  Diabetes screening. This is done by checking your blood sugar (glucose) after you have not eaten for a while (fasting). You may have this done every 1-3 years.  Mammogram. This may be done every 1-2 years. Talk to your health care provider about when you should start having regular mammograms. This may depend on whether you have a family history of breast cancer.  BRCA-related cancer screening. This may be done if you have a family history of breast, ovarian, tubal, or peritoneal cancers.  Pelvic exam and Pap test. This may be done every 3 years starting at age 20. Starting at age 84, this may be done every 5 years if you have a Pap test in combination with an HPV test.  Bone density scan. This is done to screen for osteoporosis. You may have this scan if you are at high risk for osteoporosis. Discuss your test results, treatment options, and if necessary, the need for more tests with your health care provider. Vaccines  Your health care provider may recommend certain vaccines, such as:  Influenza vaccine. This is recommended every year.  Tetanus, diphtheria, and acellular pertussis (Tdap, Td) vaccine. You may need a Td booster every 10 years.  Varicella vaccine. You may need this if you have not been vaccinated.  Zoster vaccine. You may need this after age 17.  Measles, mumps, and rubella (MMR) vaccine. You may need at least one dose of MMR if you were born  in 1957 or later. You may also need a second dose.  Pneumococcal 13-valent conjugate (PCV13) vaccine. You may need this if you have certain conditions and were not previously vaccinated.  Pneumococcal polysaccharide (PPSV23) vaccine. You may need one or two doses if you smoke cigarettes or if you have certain conditions.  Meningococcal vaccine. You may need this if you have certain  conditions.  Hepatitis A vaccine. You may need this if you have certain conditions or if you travel or work in places where you may be exposed to hepatitis A.  Hepatitis B vaccine. You may need this if you have certain conditions or if you travel or work in places where you may be exposed to hepatitis B.  Haemophilus influenzae type b (Hib) vaccine. You may need this if you have certain conditions. Talk to your health care provider about which screenings and vaccines you need and how often you need them. This information is not intended to replace advice given to you by your health care provider. Make sure you discuss any questions you have with your health care provider. Document Released: 07/25/2015 Document Revised: 03/17/2016 Document Reviewed: 04/29/2015 Elsevier Interactive Patient Education  2017 [employer].

## 2016-11-13 LAB — COMPREHENSIVE METABOLIC PANEL
A/G RATIO: 1.7 (ref 1.2–2.2)
ALT: 15 IU/L (ref 0–32)
AST: 18 IU/L (ref 0–40)
Albumin: 4 g/dL (ref 3.5–5.5)
Alkaline Phosphatase: 60 IU/L (ref 39–117)
BUN/Creatinine Ratio: 10 (ref 9–23)
BUN: 10 mg/dL (ref 6–24)
Bilirubin Total: <0.2 mg/dL (ref 0.0–1.2)
CALCIUM: 8.9 mg/dL (ref 8.7–10.2)
CO2: 27 mmol/L (ref 18–29)
Chloride: 96 mmol/L (ref 96–106)
Creatinine, Ser: 0.96 mg/dL (ref 0.57–1.00)
GFR calc Af Amer: 81 mL/min/{1.73_m2} (ref >59)
GFR, EST NON AFRICAN AMERICAN: 71 mL/min/{1.73_m2} (ref >59)
GLUCOSE: 79 mg/dL (ref 65–99)
Globulin, Total: 2.4 g/dL (ref 1.5–4.5)
POTASSIUM: 5 mmol/L (ref 3.5–5.2)
Sodium: 137 mmol/L (ref 134–144)
Total Protein: 6.4 g/dL (ref 6.0–8.5)

## 2016-11-13 LAB — CBC
Hematocrit: 40.8 % (ref 34.0–46.6)
Hemoglobin: 13.3 g/dL (ref 11.1–15.9)
MCH: 32 pg (ref 26.6–33.0)
MCHC: 32.6 g/dL (ref 31.5–35.7)
MCV: 98 fL — AB (ref 79–97)
PLATELETS: 342 10*3/uL (ref 150–379)
RBC: 4.15 x10E6/uL (ref 3.77–5.28)
RDW: 13.5 % (ref 12.3–15.4)
WBC: 7.4 10*3/uL (ref 3.4–10.8)

## 2016-11-13 LAB — LIPID PANEL
CHOL/HDL RATIO: 3.7 ratio (ref 0.0–4.4)
Cholesterol, Total: 172 mg/dL (ref 100–199)
HDL: 47 mg/dL (ref >39)
LDL Calculated: 103 mg/dL — ABNORMAL HIGH (ref 0–99)
TRIGLYCERIDES: 112 mg/dL (ref 0–149)
VLDL CHOLESTEROL CAL: 22 mg/dL (ref 5–40)

## 2016-11-13 LAB — VITAMIN D 25 HYDROXY (VIT D DEFICIENCY, FRACTURES): Vit D, 25-Hydroxy: 10.5 ng/mL — ABNORMAL LOW (ref 30.0–100.0)

## 2016-11-13 LAB — TSH: TSH: 2.75 u[IU]/mL (ref 0.450–4.500)

## 2016-11-15 ENCOUNTER — Telehealth: Payer: Self-pay | Admitting: *Deleted

## 2016-11-15 DIAGNOSIS — R7989 Other specified abnormal findings of blood chemistry: Secondary | ICD-10-CM

## 2016-11-15 MED ORDER — VITAMIN D (CHOLECALCIFEROL) 10 MCG (400 UNIT) PO CAPS: 2.0000 | ORAL_CAPSULE | Freq: Every day | ORAL | 2 refills | Status: DC

## 2016-11-15 MED ORDER — VITAMIN D (ERGOCALCIFEROL) 1.25 MG (50000 UNIT) PO CAPS: 50000.0000 [IU] | ORAL_CAPSULE | ORAL | 0 refills | Status: DC

## 2016-11-15 NOTE — Telephone Encounter (Signed)
-----   Message from Donnamae Jude, MD sent at 11/15/2016 10:13 AM EDT ----- Her vitamin D is very low--add Vitamin D 50,000u weekly x 8 wks, then 800 u daily--re check level in 6 months

## 2016-11-15 NOTE — Telephone Encounter (Signed)
Informed pt of results and recommendation to take Vitamin D as directed.  Sent rx to pharmacy and instructed on medication use, also instructed to call back in 6 months to schedule repeat Vitamin D level.  Pt acknowledged instructions.

## 2016-11-16 DIAGNOSIS — L853 Xerosis cutis: Secondary | ICD-10-CM | POA: Diagnosis not present

## 2016-11-16 DIAGNOSIS — L650 Telogen effluvium: Secondary | ICD-10-CM | POA: Diagnosis not present

## 2016-11-16 DIAGNOSIS — Z86018 Personal history of other benign neoplasm: Secondary | ICD-10-CM | POA: Diagnosis not present

## 2016-11-16 DIAGNOSIS — E559 Vitamin D deficiency, unspecified: Secondary | ICD-10-CM | POA: Diagnosis not present

## 2016-11-16 LAB — CYTOLOGY - PAP
Adequacy: ABSENT
DIAGNOSIS: NEGATIVE
HPV: NOT DETECTED

## 2016-11-17 DIAGNOSIS — F39 Unspecified mood [affective] disorder: Secondary | ICD-10-CM | POA: Diagnosis not present

## 2016-11-17 LAB — CERVICOVAGINAL ANCILLARY ONLY
Bacterial vaginitis: NEGATIVE
CANDIDA VAGINITIS: NEGATIVE
Trichomonas: NEGATIVE

## 2016-11-18 DIAGNOSIS — K3189 Other diseases of stomach and duodenum: Secondary | ICD-10-CM | POA: Diagnosis not present

## 2016-11-18 DIAGNOSIS — R12 Heartburn: Secondary | ICD-10-CM | POA: Diagnosis not present

## 2016-11-18 DIAGNOSIS — K319 Disease of stomach and duodenum, unspecified: Secondary | ICD-10-CM | POA: Diagnosis not present

## 2016-11-18 DIAGNOSIS — I10 Essential (primary) hypertension: Secondary | ICD-10-CM | POA: Diagnosis not present

## 2016-11-18 DIAGNOSIS — E039 Hypothyroidism, unspecified: Secondary | ICD-10-CM | POA: Diagnosis not present

## 2016-11-18 DIAGNOSIS — R1013 Epigastric pain: Secondary | ICD-10-CM | POA: Diagnosis not present

## 2016-11-18 DIAGNOSIS — K219 Gastro-esophageal reflux disease without esophagitis: Secondary | ICD-10-CM | POA: Diagnosis not present

## 2016-11-18 DIAGNOSIS — K2900 Acute gastritis without bleeding: Secondary | ICD-10-CM | POA: Diagnosis not present

## 2016-11-19 ENCOUNTER — Ambulatory Visit
Admission: RE | Admit: 2016-11-19 | Discharge: 2016-11-19 | Disposition: A | Discharge status: Home / Self Care | Payer: 59 | Source: Ambulatory Visit | Attending: Bariatrics | Admitting: Bariatrics

## 2016-11-19 DIAGNOSIS — Z79899 Other long term (current) drug therapy: Secondary | ICD-10-CM | POA: Diagnosis not present

## 2016-11-19 DIAGNOSIS — K219 Gastro-esophageal reflux disease without esophagitis: Secondary | ICD-10-CM | POA: Diagnosis not present

## 2016-11-19 DIAGNOSIS — F39 Unspecified mood [affective] disorder: Secondary | ICD-10-CM | POA: Diagnosis not present

## 2016-12-08 DIAGNOSIS — R0789 Other chest pain: Secondary | ICD-10-CM | POA: Diagnosis not present

## 2016-12-08 DIAGNOSIS — R1310 Dysphagia, unspecified: Secondary | ICD-10-CM | POA: Diagnosis not present

## 2016-12-08 DIAGNOSIS — K219 Gastro-esophageal reflux disease without esophagitis: Secondary | ICD-10-CM | POA: Diagnosis not present

## 2016-12-29 DIAGNOSIS — F39 Unspecified mood [affective] disorder: Secondary | ICD-10-CM | POA: Diagnosis not present

## 2017-01-04 ENCOUNTER — Other Ambulatory Visit: Payer: Self-pay | Admitting: Family Medicine

## 2017-01-07 DIAGNOSIS — F902 Attention-deficit hyperactivity disorder, combined type: Secondary | ICD-10-CM | POA: Diagnosis not present

## 2017-01-07 DIAGNOSIS — F329 Major depressive disorder, single episode, unspecified: Secondary | ICD-10-CM | POA: Diagnosis not present

## 2017-01-07 DIAGNOSIS — F419 Anxiety disorder, unspecified: Secondary | ICD-10-CM | POA: Diagnosis not present

## 2017-01-24 ENCOUNTER — Ambulatory Visit (INDEPENDENT_AMBULATORY_CARE_PROVIDER_SITE_OTHER): Payer: 59 | Admitting: Podiatry

## 2017-01-24 DIAGNOSIS — M779 Enthesopathy, unspecified: Secondary | ICD-10-CM | POA: Diagnosis not present

## 2017-01-24 NOTE — Progress Notes (Signed)
She presents today with a chief complaint of pain to the ball of foot this been present for the past month or so. She denies any trauma and states that she just woke up one day and there was pain in the forefoot right.  Objective: Vital signs are stable she's alert and oriented 3 pulses are palpable area she has pain on palpation and range of motion of the second and third metatarsophalangeal joints of the right foot. States there is some tenderness beneath the level of the third and fourth metatarsophalangeal joints. She presents today in flip flops.  Assessment: Capsulitis second and third metatarsophalangeal joints.  Plan: I injected the area today around the second and third metatarsophalangeal joints with Kenalog and local anesthetic. Follow-up with her in 1 month if necessary.

## 2017-02-04 DIAGNOSIS — F39 Unspecified mood [affective] disorder: Secondary | ICD-10-CM | POA: Diagnosis not present

## 2017-02-04 DIAGNOSIS — F902 Attention-deficit hyperactivity disorder, combined type: Secondary | ICD-10-CM | POA: Diagnosis not present

## 2017-02-04 DIAGNOSIS — Z1231 Encounter for screening mammogram for malignant neoplasm of breast: Secondary | ICD-10-CM | POA: Diagnosis not present

## 2017-02-04 DIAGNOSIS — Z9289 Personal history of other medical treatment: Secondary | ICD-10-CM | POA: Diagnosis not present

## 2017-02-11 DIAGNOSIS — E063 Autoimmune thyroiditis: Secondary | ICD-10-CM | POA: Diagnosis not present

## 2017-02-14 ENCOUNTER — Other Ambulatory Visit: Payer: Self-pay | Admitting: *Deleted

## 2017-02-14 ENCOUNTER — Ambulatory Visit (INDEPENDENT_AMBULATORY_CARE_PROVIDER_SITE_OTHER): Payer: 59 | Admitting: Family Medicine

## 2017-02-14 ENCOUNTER — Encounter: Payer: Self-pay | Admitting: Family Medicine

## 2017-02-14 ENCOUNTER — Encounter: Payer: Self-pay | Admitting: *Deleted

## 2017-02-14 DIAGNOSIS — J309 Allergic rhinitis, unspecified: Secondary | ICD-10-CM | POA: Insufficient documentation

## 2017-02-14 DIAGNOSIS — J301 Allergic rhinitis due to pollen: Secondary | ICD-10-CM

## 2017-02-14 DIAGNOSIS — Z01419 Encounter for gynecological examination (general) (routine) without abnormal findings: Secondary | ICD-10-CM

## 2017-02-14 DIAGNOSIS — M62838 Other muscle spasm: Secondary | ICD-10-CM | POA: Diagnosis not present

## 2017-02-14 MED ORDER — RIZATRIPTAN BENZOATE 10 MG PO TABS: ORAL_TABLET | ORAL | 0 refills | Status: DC

## 2017-02-14 MED ORDER — METHOCARBAMOL 750 MG PO TABS: 750.0000 mg | ORAL_TABLET | Freq: Four times a day (QID) | ORAL | 0 refills | Status: DC

## 2017-02-14 MED ORDER — LEVOCETIRIZINE DIHYDROCHLORIDE 5 MG PO TABS: 5.0000 mg | ORAL_TABLET | Freq: Every evening | ORAL | 5 refills | Status: DC

## 2017-02-14 NOTE — Progress Notes (Signed)
Subjective:   Patient ID: Mandy Torres, female    DOB: [birth year], 46 y.o.   MRN: [id number]  Mandy Torres is a pleasant 46 y.o. year old female who presents to clinic today with Medication Refill (robaxin 761m prn, xyxal)  on 02/14/2017  HPI:  Doing well.  Getting married in October.   Cervical DDD with muscle spasms- uses as needed Robaxin.  Tries not to take it often.  Asking for refills today.   Allergic rhinitis-  Just started xyzal last month. Feels it is working very well.  Current Outpatient Prescriptions on File Prior to Visit  Medication Sig Dispense Refill  . Biotin 1000 MCG tablet Take 1,000 mcg by mouth daily.    .Marland Kitchenibuprofen (ADVIL,MOTRIN) 600 MG tablet Take 600 mg by mouth once.  0  . ketoconazole (NIZORAL) 2 % shampoo   6  . norethindrone-ethinyl estradiol (MICROGESTIN,JUNEL,LOESTRIN) 1-20 MG-MCG tablet Take 1 tablet by mouth daily. 3 Package 6  . Probiotic Product (TRUBIOTICS PO) Take by mouth.    . promethazine (PHENERGAN) 25 MG tablet Take 1 tablet (25 mg total) by mouth every 8 (eight) hours as needed. 20 tablet 0  . pseudoephedrine (SUDAFED) 60 MG tablet Take 60 mg by mouth every 4 (four) hours as needed for congestion.    . triamcinolone (NASACORT ALLERGY 24HR) 55 MCG/ACT AERO nasal inhaler Place 2 sprays into the nose as needed.     . Vitamin D, Cholecalciferol, 400 units CAPS Take 2 capsules by mouth daily. 180 capsule 2  . Vitamin D, Ergocalciferol, (DRISDOL) 50000 units CAPS capsule Take 1 capsule (50,000 Units total) by mouth every 7 (seven) days. For 8 weeks (Patient taking differently: Take 50,000 Units by mouth every Monday, Wednesday, and Friday. For 8 weeks) 8 capsule 0   Current Facility-Administered Medications on File Prior to Visit  Medication Dose Route Frequency Provider Last Rate Last Dose  . clindamycin (CLEOCIN) IVPB 900 mg  900 mg Intravenous Once TLadora Daniel MD        Allergies  Allergen Reactions  . Avelox [Moxifloxacin  Hcl In Nacl] Hives  . Cheese     FETA CHEESE ONLY-MIGRAINES  . Endocet [Oxycodone-Acetaminophen] Nausea Only  . Levofloxacin Nausea And Vomiting  . Niacin And Related     ONLY IF LARGE AMOUNTS IN FOOD ONLY-MIGRAINES  . Penicillins Hives and Nausea Only  . Percocet [Oxycodone-Acetaminophen] Nausea And Vomiting    Also Endocet  . Ranitidine Nausea And Vomiting  . Sulfa Antibiotics Hives  . Topamax     congnitive impairmant  . Valium     Hyperactivity     Past Medical History:  Diagnosis Date  . ADHD (attention deficit hyperactivity disorder)    [name] . Allergy   . Anemia   . BRCA negative 05/27/2016  . Complication of anesthesia   . Depression   . DJD (degenerative joint disease), cervical    [facility] . Foot neuroma   . GERD (gastroesophageal reflux disease)   . Hiatal hernia   . History of breast biopsy 05/2001  . Hx MRSA infection    X 2-LAST HAD IN 2015  . Hypertension   . Hypothyroidism   . Migraine   . PONV (postoperative nausea and vomiting)   . Thyroid disease 2017  . TMJ disease     Past Surgical History:  Procedure Laterality Date  . [place] N/A 12/31/2015   Procedure: 2[place]  STUDY;  Surgeon: Lucilla Lame, MD;  Location: [facility] ENDOSCOPY;  Service: Endoscopy;  Laterality: N/A;  . APPENDECTOMY    . BREAST SURGERY     normal biopsy 2002, left  . CHOLECYSTECTOMY    . ESOPHAGEAL MANOMETRY N/A 12/17/2015   Procedure: ESOPHAGEAL MANOMETRY (EM);  Surgeon: Lucilla Lame, MD;  Location: [facility] ENDOSCOPY;  Service: Endoscopy;  Laterality: N/A;  . FACIAL RECONSTRUCTION SURGERY  2/88, 9/88, 2/89  . HIATAL HERNIA REPAIR    . KNEE SURGERY  11/97 and 4/93   right  . METATARSAL OSTEOTOMY WITH BUNIONECTOMY  2016  . NASAL SINUS SURGERY  2006 and 1998   deviated septum, ethmoids, Dr. Caryn Section then Richardson Landry  . OOPHORECTOMY Right   . Right and Left Foot Surgery  06/2016    Family History  Problem Relation Age of Onset  . Cancer  Mother 39       Breast  . Diabetes Mother   . Thyroid disease Mother   . Cancer Sister 59       Half sister, Breast  . Cancer Maternal Aunt        Breast  . Diabetes Maternal Aunt   . Thyroid disease Maternal Aunt   . Heart disease Maternal Grandmother   . Pancreatic cancer Maternal Grandfather     Social History   Social History  . Marital status: Single    Spouse name: N/A  . Number of children: N/A  . Years of education: N/A   Occupational History  . Not on file.   Social History Main Topics  . Smoking status: Never Smoker  . Smokeless tobacco: Never Used  . Alcohol use No  . Drug use: No  . Sexual activity: Yes    Birth control/ protection: Pill   Other Topics Concern  . Not on file   Social History Narrative   Single   0 children   0 pregnancies    First menstrual cycle: 46 yrs old         The PMH, PSH, Social History, Family History, Medications, and allergies have been reviewed in [facility], and have been updated if relevant.   Review of Systems  HENT: Positive for congestion, postnasal drip and rhinorrhea. Negative for sinus pain and sinus pressure.   Respiratory: Negative.   Cardiovascular: Negative.   Gastrointestinal: Negative.   Musculoskeletal: Positive for neck pain and neck stiffness.  All other systems reviewed and are negative.      Objective:    BP 110/80   Pulse (!) 103   Ht 5' 2"  (1.575 m)   Wt 159 lb (72.1 kg)   SpO2 99%   BMI 29.08 kg/m    Physical Exam  Constitutional: She is oriented to person, place, and time. She appears well-developed and well-nourished. No distress.  HENT:  Head: Normocephalic and atraumatic.  Eyes: Conjunctivae are normal.  Cardiovascular: Normal rate.   Pulmonary/Chest: Effort normal.  Musculoskeletal: Normal range of motion. She exhibits no edema.  Neurological: She is alert and oriented to person, place, and time. No cranial nerve deficit.  Skin: Skin is warm and dry. She is not diaphoretic.    Psychiatric: She has a normal mood and affect. Her behavior is normal. Judgment and thought content normal.  Nursing note and vitals reviewed.         Assessment & Plan:   Seasonal allergic rhinitis due to pollen  Cervical paraspinal muscle spasm No Follow-up on file.

## 2017-02-14 NOTE — Assessment & Plan Note (Signed)
She uses robaxin as needed, sparingly and appropriately. eRx refills sent.

## 2017-02-14 NOTE — Assessment & Plan Note (Signed)
Improved with xyzal. eRx refills sent.

## 2017-02-18 DIAGNOSIS — E063 Autoimmune thyroiditis: Secondary | ICD-10-CM | POA: Diagnosis not present

## 2017-02-18 DIAGNOSIS — Z8349 Family history of other endocrine, nutritional and metabolic diseases: Secondary | ICD-10-CM | POA: Diagnosis not present

## 2017-02-23 DIAGNOSIS — F39 Unspecified mood [affective] disorder: Secondary | ICD-10-CM | POA: Diagnosis not present

## 2017-03-03 ENCOUNTER — Ambulatory Visit: Payer: 59 | Admitting: Family Medicine

## 2017-03-23 DIAGNOSIS — F39 Unspecified mood [affective] disorder: Secondary | ICD-10-CM | POA: Diagnosis not present

## 2017-04-04 DIAGNOSIS — F39 Unspecified mood [affective] disorder: Secondary | ICD-10-CM | POA: Diagnosis not present

## 2017-04-06 ENCOUNTER — Telehealth: Payer: 59 | Admitting: Nurse Practitioner

## 2017-04-06 DIAGNOSIS — B3731 Acute candidiasis of vulva and vagina: Secondary | ICD-10-CM

## 2017-04-06 DIAGNOSIS — B373 Candidiasis of vulva and vagina: Secondary | ICD-10-CM | POA: Diagnosis not present

## 2017-04-06 MED ORDER — FLUCONAZOLE 150 MG PO TABS: 150.0000 mg | ORAL_TABLET | Freq: Once | ORAL | 0 refills | Status: AC

## 2017-04-06 NOTE — Progress Notes (Signed)

## 2017-04-12 ENCOUNTER — Other Ambulatory Visit: Payer: Self-pay | Admitting: Family Medicine

## 2017-04-13 ENCOUNTER — Other Ambulatory Visit: Payer: Self-pay | Admitting: General Surgery

## 2017-04-13 ENCOUNTER — Encounter: Payer: Self-pay | Admitting: General Surgery

## 2017-04-13 ENCOUNTER — Other Ambulatory Visit (HOSPITAL_COMMUNITY): Payer: Self-pay | Admitting: Family Medicine

## 2017-04-13 ENCOUNTER — Other Ambulatory Visit (INDEPENDENT_AMBULATORY_CARE_PROVIDER_SITE_OTHER): Payer: 59

## 2017-04-13 ENCOUNTER — Ambulatory Visit (INDEPENDENT_AMBULATORY_CARE_PROVIDER_SITE_OTHER): Payer: 59 | Admitting: General Surgery

## 2017-04-13 VITALS — BP 122/70 | HR 83 | Resp 12 | Ht 62.0 in | Wt 163.0 lb

## 2017-04-13 VITALS — BP 114/76 | HR 96

## 2017-04-13 DIAGNOSIS — K644 Residual hemorrhoidal skin tags: Secondary | ICD-10-CM

## 2017-04-13 DIAGNOSIS — Z1211 Encounter for screening for malignant neoplasm of colon: Secondary | ICD-10-CM

## 2017-04-13 DIAGNOSIS — N898 Other specified noninflammatory disorders of vagina: Secondary | ICD-10-CM | POA: Diagnosis not present

## 2017-04-13 DIAGNOSIS — Z113 Encounter for screening for infections with a predominantly sexual mode of transmission: Secondary | ICD-10-CM

## 2017-04-13 DIAGNOSIS — Z7689 Persons encountering health services in other specified circumstances: Secondary | ICD-10-CM | POA: Diagnosis not present

## 2017-04-13 NOTE — Patient Instructions (Addendum)
Recommend adding daily fiber supplements of choice (metawafers or Fibercon).  Colonoscopy, Adult A colonoscopy is an exam to look at the entire large intestine. During the exam, a lubricated, bendable tube is inserted into the anus and then passed into the rectum, colon, and other parts of the large intestine. A colonoscopy is often done as a part of normal colorectal screening or in response to certain symptoms, such as anemia, persistent diarrhea, abdominal pain, and blood in the stool. The exam can help screen for and diagnose medical problems, including:  Tumors.  Polyps.  Inflammation.  Areas of bleeding.  Tell a health care provider about:  Any allergies you have.  All medicines you are taking, including vitamins, herbs, eye drops, creams, and over-the-counter medicines.  Any problems you or family members have had with anesthetic medicines.  Any blood disorders you have.  Any surgeries you have had.  Any medical conditions you have.  Any problems you have had passing stool. What are the risks? Generally, this is a safe procedure. However, problems may occur, including:  Bleeding.  A tear in the intestine.  A reaction to medicines given during the exam.  Infection (rare).  What happens before the procedure? Eating and drinking restrictions Follow instructions from your health care provider about eating and drinking, which may include:  A few days before the procedure - follow a low-fiber diet. Avoid nuts, seeds, dried fruit, raw fruits, and vegetables.  1-3 days before the procedure - follow a clear liquid diet. Drink only clear liquids, such as clear broth or bouillon, black coffee or tea, clear juice, clear soft drinks or sports drinks, gelatin dessert, and popsicles. Avoid any liquids that contain red or purple dye.  On the day of the procedure - do not eat or drink anything during the 2 hours before the procedure, or within the time period that your health  care provider recommends.  Bowel prep If you were prescribed an oral bowel prep to clean out your colon:  Take it as told by your health care provider. Starting the day before your procedure, you will need to drink a large amount of medicated liquid. The liquid will cause you to have multiple loose stools until your stool is almost clear or light green.  If your skin or anus gets irritated from diarrhea, you may use these to relieve the irritation: ? Medicated wipes, such as adult wet wipes with aloe and vitamin E. ? A skin soothing-product like petroleum jelly.  If you vomit while drinking the bowel prep, take a break for up to 60 minutes and then begin the bowel prep again. If vomiting continues and you cannot take the bowel prep without vomiting, call your health care provider.  General instructions  Ask your health care provider about changing or stopping your regular medicines. This is especially important if you are taking diabetes medicines or blood thinners.  Plan to have someone take you home from the hospital or clinic. What happens during the procedure?  An IV tube may be inserted into one of your veins.  You will be given medicine to help you relax (sedative).  To reduce your risk of infection: ? Your health care team will wash or sanitize their hands. ? Your anal area will be washed with soap.  You will be asked to lie on your side with your knees bent.  Your health care provider will lubricate a long, thin, flexible tube. The tube will have a camera and a light  on the end.  The tube will be inserted into your anus.  The tube will be gently eased through your rectum and colon.  Air will be delivered into your colon to keep it open. You may feel some pressure or cramping.  The camera will be used to take images during the procedure.  A small tissue sample may be removed from your body to be examined under a microscope (biopsy). If any potential problems are found,  the tissue will be sent to a lab for testing.  If small polyps are found, your health care provider may remove them and have them checked for cancer cells.  The tube that was inserted into your anus will be slowly removed. The procedure may vary among health care providers and hospitals. What happens after the procedure?  Your blood pressure, heart rate, breathing rate, and blood oxygen level will be monitored until the medicines you were given have worn off.  Do not drive for 24 hours after the exam.  You may have a small amount of blood in your stool.  You may pass gas and have mild abdominal cramping or bloating due to the air that was used to inflate your colon during the exam.  It is up to you to get the results of your procedure. Ask your health care provider, or the department performing the procedure, when your results will be ready. This information is not intended to replace advice given to you by your health care provider. Make sure you discuss any questions you have with your health care provider. Document Released: 06/25/2000 Document Revised: 04/28/2016 Document Reviewed: 09/09/2015 Elsevier Interactive Patient Education  2018 [employer].

## 2017-04-13 NOTE — Progress Notes (Signed)
Patient ID: Mandy Torres, female   DOB: [birth year], 46 y.o.   MRN: [id number]  Chief Complaint  Patient presents with  . Colonoscopy    HPI Mandy Torres is a 46 y.o. female here today to discuss colonoscopy.  She also wants the rectal skin tag checked, she feels like it is larger and is more difficult to clean after her BM. Bowels move daily alternating with diarrhea and constipation since her hiatal hernia surgery. Some days she has several small BM's, but no bleeding. She is getting married this month and may defer any interventions until later in the year. The patient reported becoming aware of the change in the [place] guidelines about screening colonoscopy, screening H moved around age 9.  HPI  Past Medical History:  Diagnosis Date  . ADHD (attention deficit hyperactivity disorder)    Mandy Torres  . Allergy   . Anemia   . BRCA negative 05/27/2016  . Complication of anesthesia   . Depression   . DJD (degenerative joint disease), cervical    neurosurg - Dr. Wendall Stade at [facility]  . Foot neuroma   . GERD (gastroesophageal reflux disease)   . Hiatal hernia   . History of breast biopsy 05/2001  . Hx MRSA infection    X 2-LAST HAD IN 2015  . Hypertension   . Hypothyroidism   . Migraine   . PONV (postoperative nausea and vomiting)   . Thyroid disease 2017  . TMJ disease     Past Surgical History:  Procedure Laterality Date  . [place] STUDY N/A 12/31/2015   Procedure: [place] STUDY;  Surgeon: Mandy Lame, MD;  Location: [facility] ENDOSCOPY;  Service: Endoscopy;  Laterality: N/A;  . APPENDECTOMY    . BREAST SURGERY     normal biopsy 2002, left  . CHOLECYSTECTOMY    . ESOPHAGEAL MANOMETRY N/A 12/17/2015   Procedure: ESOPHAGEAL MANOMETRY (EM);  Surgeon: Mandy Lame, MD;  Location: [facility] ENDOSCOPY;  Service: Endoscopy;  Laterality: N/A;  . FACIAL RECONSTRUCTION SURGERY  2/88, 9/88, 2/89  . HIATAL HERNIA REPAIR  02/03/2016   Mandy Torres, M.D.  . KNEE  SURGERY  11/97 and 4/93   right  . METATARSAL OSTEOTOMY WITH BUNIONECTOMY  2016  . NASAL SINUS SURGERY  2006 and 1998   deviated septum, ethmoids, Dr. Caryn Section then Richardson Landry  . OOPHORECTOMY Right   . Right and Left Foot Surgery  06/2016    Family History  Problem Relation Age of Onset  . Cancer Mother 65       Breast  . Diabetes Mother   . Thyroid disease Mother   . Cancer Sister 50       Half sister, Breast  . Cancer Maternal Aunt        Breast  . Diabetes Maternal Aunt   . Thyroid disease Maternal Aunt   . Heart disease Maternal Grandmother   . Pancreatic cancer Maternal Grandfather     Social History Social History  Substance Use Topics  . Smoking status: Never Smoker  . Smokeless tobacco: Never Used  . Alcohol use No    Allergies  Allergen Reactions  . Avelox [Moxifloxacin Hcl In Nacl] Hives  . Cheese     FETA CHEESE ONLY-MIGRAINES  . Endocet [Oxycodone-Acetaminophen] Nausea Only  . Levofloxacin Nausea And Vomiting  . Niacin And Related     ONLY IF LARGE AMOUNTS IN FOOD ONLY-MIGRAINES  . Penicillins Hives and Nausea Only  . Percocet [Oxycodone-Acetaminophen] Nausea And  Vomiting    Also Endocet  . Ranitidine Nausea And Vomiting  . Sulfa Antibiotics Hives  . Topamax     congnitive impairmant  . Valium     Hyperactivity     Current Outpatient Prescriptions  Medication Sig Dispense Refill  . Biotin 1000 MCG tablet Take 1,000 mcg by mouth daily.    Marland Kitchen CITALOPRAM HYDROBROMIDE PO Take 40 mg by mouth daily.    . divalproex (DEPAKOTE) 250 MG DR tablet Take 250 mg by mouth 2 (two) times daily. One in the am and one pm    . ibuprofen (ADVIL,MOTRIN) 600 MG tablet Take 600 mg by mouth once.  0  . ketoconazole (NIZORAL) 2 % shampoo   6  . levocetirizine (XYZAL) 5 MG tablet Take 1 tablet (5 mg total) by mouth every evening. 30 tablet 5  . lurasidone (LATUDA) 20 MG TABS tablet Take 10 mg by mouth daily.    . methocarbamol (ROBAXIN-750) 750 MG tablet Take 1 tablet (750 mg  total) by mouth 4 (four) times daily. 30 tablet 0  . norethindrone-ethinyl estradiol (MICROGESTIN,JUNEL,LOESTRIN) 1-20 MG-MCG tablet Take 1 tablet by mouth daily. 3 Package 6  . Probiotic Product (TRUBIOTICS PO) Take by mouth.    . promethazine (PHENERGAN) 25 MG tablet Take 1 tablet (25 mg total) by mouth every 8 (eight) hours as needed. 20 tablet 0  . pseudoephedrine (SUDAFED) 60 MG tablet Take 60 mg by mouth every 4 (four) hours as needed for congestion.    . triamcinolone (NASACORT ALLERGY 24HR) 55 MCG/ACT AERO nasal inhaler Place 2 sprays into the nose as needed.     . Vitamin D, Cholecalciferol, 400 units CAPS Take 2 capsules by mouth daily. 180 capsule 2  . Vitamin D, Ergocalciferol, (DRISDOL) 50000 units CAPS capsule Take 1 capsule (50,000 Units total) by mouth every 7 (seven) days. For 8 weeks (Patient taking differently: Take 50,000 Units by mouth every Monday, Wednesday, and Friday. For 8 weeks) 8 capsule 0  . rizatriptan (MAXALT) 10 MG tablet TAKE 1 TABLET BY MOUTH AS NEEDED. REPEAT IN 2 HOURS IF NEEDED. 9 tablet 5   No current facility-administered medications for this visit.    Facility-Administered Medications Ordered in Other Visits  Medication Dose Route Frequency Provider Last Rate Last Dose  . clindamycin (CLEOCIN) IVPB 900 mg  900 mg Intravenous Once Ladora Daniel, MD        Review of Systems Review of Systems  Constitutional: Negative.   Respiratory: Negative.   Cardiovascular: Negative.   Gastrointestinal: Positive for constipation and diarrhea. Negative for abdominal pain.    Blood pressure 122/70, pulse 83, resp. rate 12, height 5' 2" (1.575 m), weight 163 lb (73.9 kg).  Physical Exam Physical Exam  Constitutional: She is oriented to person, place, and time. She appears well-developed and well-nourished.  HENT:  Mouth/Throat: Oropharynx is clear and moist.  Eyes: Conjunctivae are normal. No scleral icterus.  Neck: Neck supple.  Cardiovascular: Normal rate,  regular rhythm and normal heart sounds.   Pulmonary/Chest: Effort normal and breath sounds normal.  Genitourinary: Rectum normal. Rectal exam shows no external hemorrhoid, no internal hemorrhoid, no fissure, no tenderness and anal tone normal.     Genitourinary Comments: Anterior redundant skin   Lymphadenopathy:    She has no cervical adenopathy.  Neurological: She is alert and oriented to person, place, and time.  Skin: Skin is warm and dry.  Psychiatric: Her behavior is normal.       Assessment  No indication for excision of the tiny anterior redundant skin tag.  Candidate for screening colonoscopy.    Plan    The patient's irregular bowel functions are likely contributing to her symptoms with the small amount of redundant anterior anal skin.    Recommend adding daily fiber supplements of choice (metawafers or Fibercon).  Colonoscopy with possible biopsy/polypectomy prn: Information regarding the procedure, including its potential risks and complications (including but not limited to perforation of the bowel, which may require emergency surgery to repair, and bleeding) was verbally given to the patient. Educational information regarding lower intestinal endoscopy was given to the patient. Written instructions for how to complete the bowel prep using Miralax were provided. The importance of drinking ample fluids to avoid dehydration as a result of the prep emphasized.  Scheduling will be deferred until after her upcoming honeymoon.  Robert Bellow 04/14/2017, 8:19 AM

## 2017-04-13 NOTE — Progress Notes (Signed)
Patient presented to the office today for a self swab. Patient reports having some vaginal discomfort. Patient instructed on how to do a self swab. Inform patient results would be back in 1-2 days and we will call her once they come back.

## 2017-04-14 ENCOUNTER — Encounter: Payer: Self-pay | Admitting: General Surgery

## 2017-04-14 ENCOUNTER — Other Ambulatory Visit: Payer: 59

## 2017-04-14 DIAGNOSIS — K644 Residual hemorrhoidal skin tags: Secondary | ICD-10-CM | POA: Insufficient documentation

## 2017-04-14 DIAGNOSIS — Z1211 Encounter for screening for malignant neoplasm of colon: Secondary | ICD-10-CM | POA: Insufficient documentation

## 2017-04-18 ENCOUNTER — Other Ambulatory Visit: Payer: Self-pay

## 2017-04-18 LAB — SPECIMEN STATUS REPORT

## 2017-04-18 MED ORDER — FLUCONAZOLE 150 MG PO TABS: 150.0000 mg | ORAL_TABLET | Freq: Once | ORAL | 2 refills | Status: AC

## 2017-04-18 NOTE — Telephone Encounter (Signed)
Patient came in for self swab. Instructed on how to do a self swab. Advised patient it would be 1-2 days before results will come back. Patient results were sent to the wrong lab. Spoke with lab they will run the test and once the results come back the patient will be notified. Spoke with patient she verbalizes understanding she thinks its just a yeast infection. Will treat per proctcol with diflucan.Called into [facility] pharmacy.

## 2017-04-21 LAB — NUSWAB VG, CANDIDA 6SP
CANDIDA ALBICANS, NAA: NEGATIVE
CANDIDA GLABRATA, NAA: NEGATIVE
CANDIDA LUSITANIAE, NAA: NEGATIVE
CANDIDA PARAPSILOSIS, NAA: NEGATIVE
Candida krusei, NAA: NEGATIVE
Candida tropicalis, NAA: NEGATIVE
Trich vag by NAA: NEGATIVE

## 2017-04-21 LAB — TRICHOMONAS VAGINALIS, PROBE AMP

## 2017-04-21 LAB — SPECIMEN STATUS REPORT

## 2017-04-21 LAB — GC/CHLAMYDIA PROBE AMP
CHLAMYDIA, DNA PROBE: NEGATIVE
NEISSERIA GONORRHOEAE BY PCR: NEGATIVE

## 2017-04-23 IMAGING — RF DG UGI W/ KUB
14 of 21 series · 14 of 24 positions shown · non-contrast
Comparison: 02/10/2016 chest CT.  11/21/2015 esophagram.

CLINICAL DATA: 45-year-old female post hiatal hernia repair. No
complaints. Subsequent encounter.

EXAM:
UPPER GI SERIES WITH KUB
TECHNIQUE: After obtaining a scout radiograph a routine upper GI series was
performed using thin and high density barium.
FLUOROSCOPY TIME:  Fluoroscopy Time:  1 minutes and 36 seconds
Radiation Exposure Index (if provided by the fluoroscopic device):
18.3 mGy
Number of Acquired Spot Images: 25

[Series 9: t abdomen supine · 0.15mm/px · 1 of 1 slices shown]
[im 1/1]
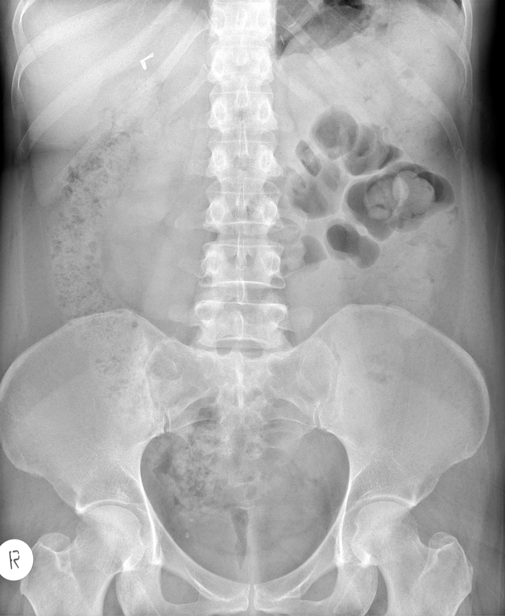

[Series 11: cp_standard · 0.26mm/px · 1 of 1 slices shown (1 of 10)]
[im 1/1]
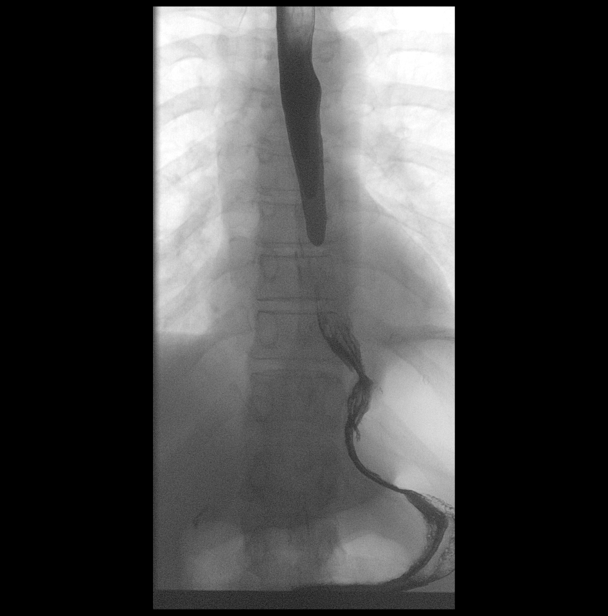

[Series 14: cp_standard · 0.26mm/px · 1 of 1 slices shown (2 of 10)]
[im 1/1]
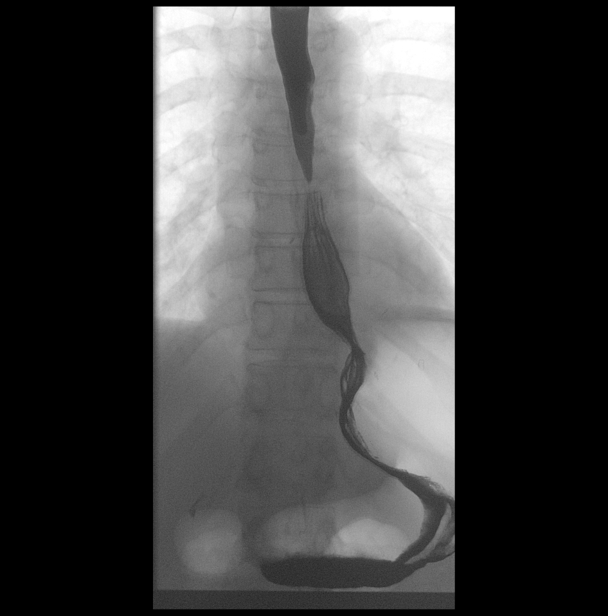

[Series 15: fluoro_barium 2fps_bw · 0.17mm/px · 1 of 6 frames shown (1 of 3)]
[frame 4/6]
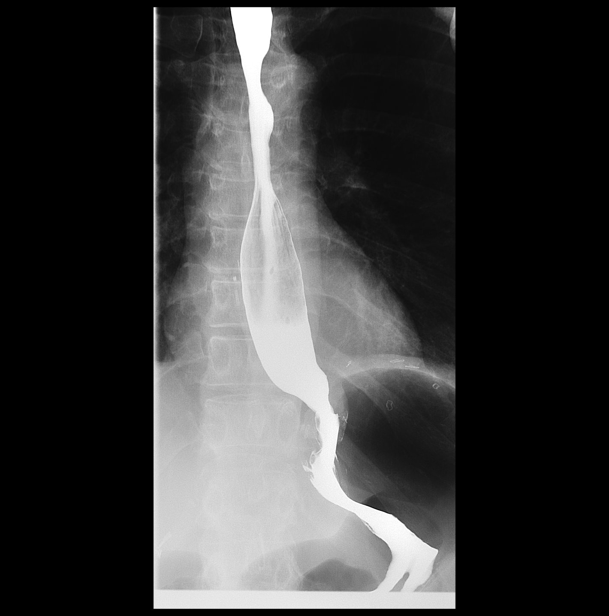

[Series 16: cp_standard · 0.26mm/px · 1 of 1 slices shown (3 of 10)]
[im 1/1]
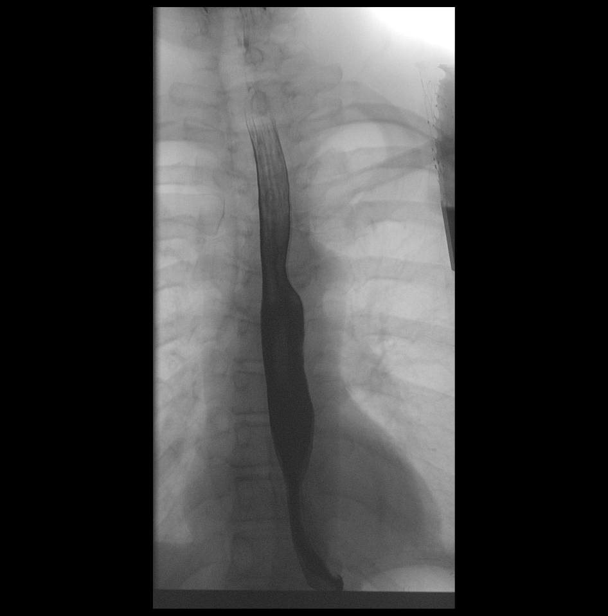

[Series 18: cp_standard · 0.26mm/px · 1 of 1 slices shown (4 of 10)]
[im 1/1]
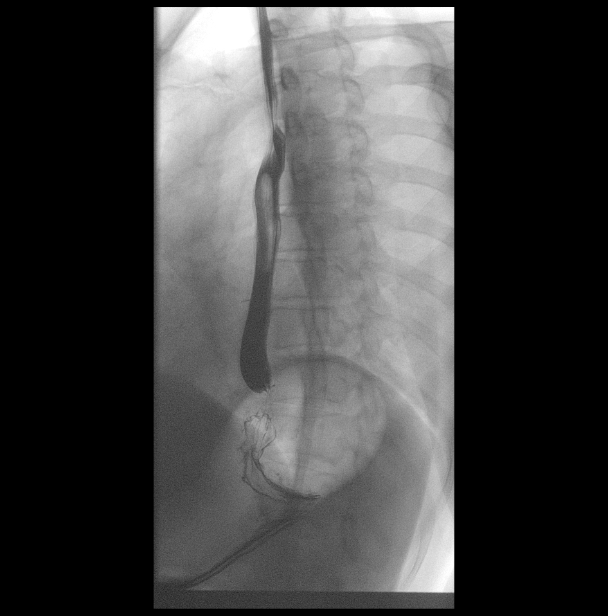

[Series 20: cp_standard · 0.26mm/px · 1 of 1 slices shown (5 of 10)]
[im 1/1]
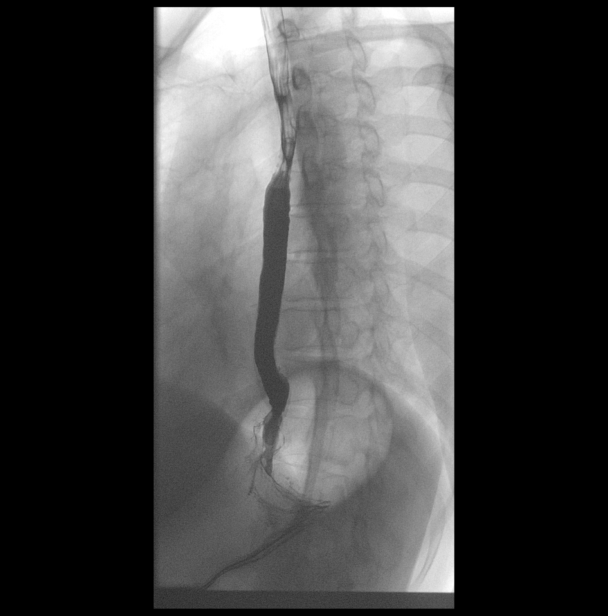

[Series 22: fluoro_barium 2fps_bw · 0.20mm/px · 1 of 2 frames shown (2 of 3)]
[frame 1/2]
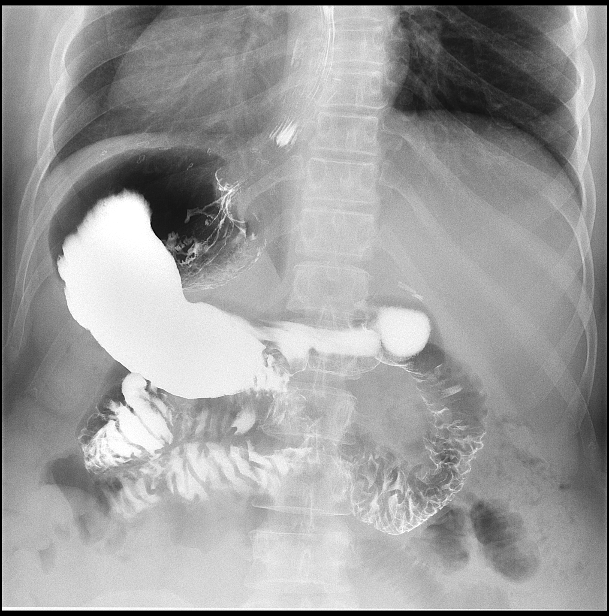

[Series 23: cp_standard · 0.29mm/px · 1 of 1 slices shown (6 of 10)]
[im 1/1]
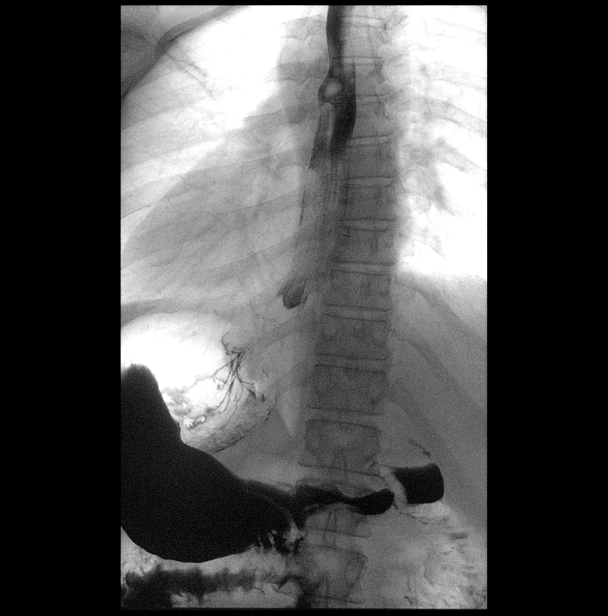

[Series 24: fluoro_barium 2fps_bw · 0.20mm/px · 1 of 8 frames shown (3 of 3)]
[frame 5/8]
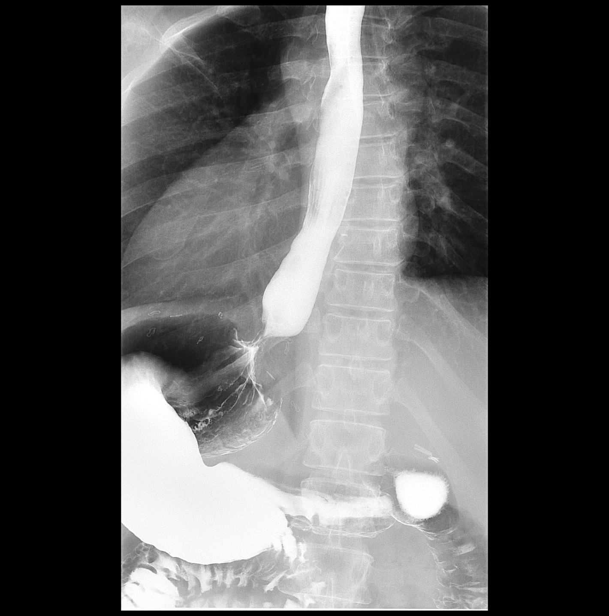

[Series 26: cp_standard · 0.29mm/px · 1 of 1 slices shown (7 of 10)]
[im 1/1]
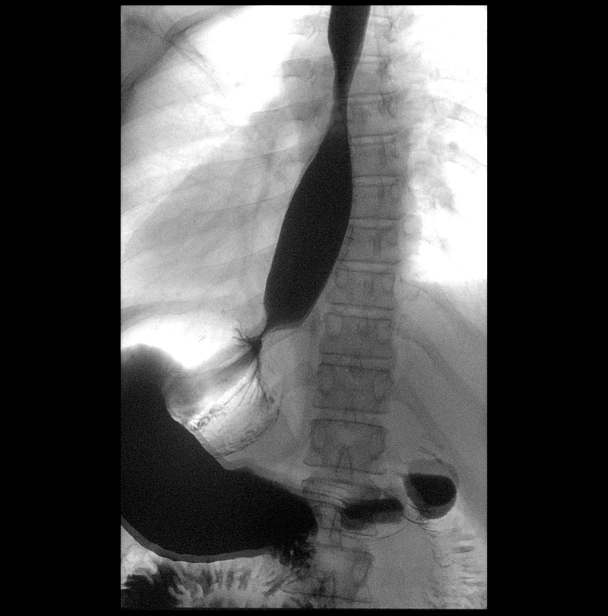

[Series 27: cp_standard · 0.29mm/px · 1 of 1 slices shown (8 of 10)]
[im 1/1]
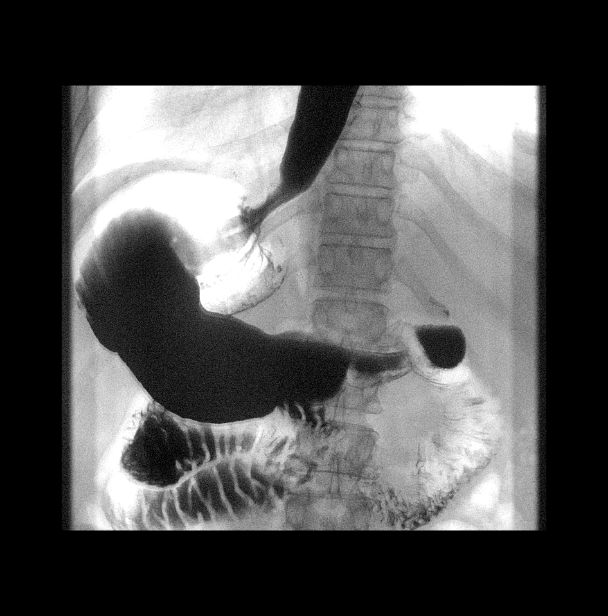

[Series 30: cp_standard · 0.27mm/px · 1 of 1 slices shown (9 of 10)]
[im 1/1]
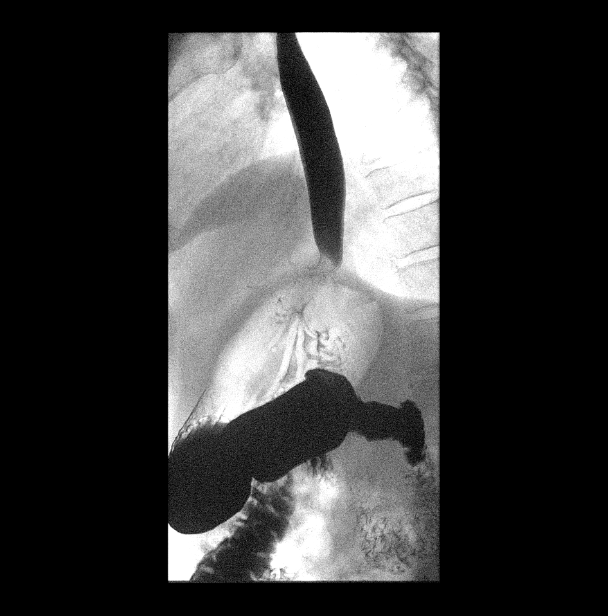

[Series 32: cp_standard · 0.27mm/px · 1 of 1 slices shown (10 of 10)]
[im 1/1]
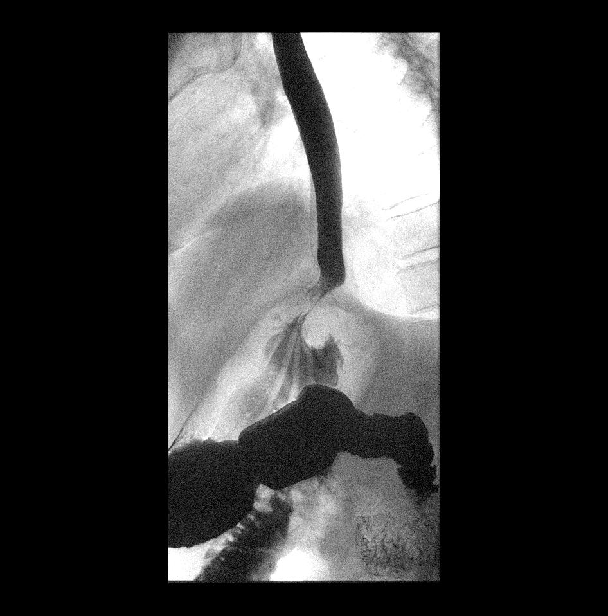

[14 of 24 positions shown; findings below may reference images not displayed]

FINDINGS: Scout view unremarkable.

Normal primary esophageal stripping wave without mucosal abnormality
or reflux. Minimal tertiary wave contractions noted during exam.

Circumferential smooth narrowing gastroesophageal junction with
impression upon the gastric fundus consistent with patient's hiatal
hernia repair. 13 mm barium tablet traverses beyond this region
without difficulty.

No gastric ulceration.

Duodenal bulb unremarkable without ulceration or mass.

Proximally visualized small bowel within normal limits.
IMPRESSION: Circumferential smooth narrowing gastroesophageal junction with
impression upon the gastric fundus consistent with patient's hiatal
hernia repair. 13 mm barium tablet traverses beyond this region
without difficulty.

No reflux demonstrated.

Remainder of exam unremarkable.

## 2017-04-27 ENCOUNTER — Telehealth: Payer: Self-pay

## 2017-04-27 NOTE — Telephone Encounter (Signed)
Call in a diflucan for patient. She thinks she has yeast.

## 2017-05-18 DIAGNOSIS — F39 Unspecified mood [affective] disorder: Secondary | ICD-10-CM | POA: Diagnosis not present

## 2017-05-20 DIAGNOSIS — F902 Attention-deficit hyperactivity disorder, combined type: Secondary | ICD-10-CM | POA: Diagnosis not present

## 2017-05-20 DIAGNOSIS — F39 Unspecified mood [affective] disorder: Secondary | ICD-10-CM | POA: Diagnosis not present

## 2017-05-30 ENCOUNTER — Telehealth: Payer: 59 | Admitting: Family

## 2017-05-30 DIAGNOSIS — J019 Acute sinusitis, unspecified: Secondary | ICD-10-CM | POA: Diagnosis not present

## 2017-05-30 DIAGNOSIS — F39 Unspecified mood [affective] disorder: Secondary | ICD-10-CM | POA: Diagnosis not present

## 2017-05-30 MED ORDER — DOXYCYCLINE HYCLATE 100 MG PO TABS: 100.0000 mg | ORAL_TABLET | Freq: Two times a day (BID) | ORAL | 0 refills | Status: DC

## 2017-05-30 NOTE — Progress Notes (Signed)

## 2017-05-31 DIAGNOSIS — F39 Unspecified mood [affective] disorder: Secondary | ICD-10-CM | POA: Diagnosis not present

## 2017-07-01 ENCOUNTER — Encounter: Payer: Self-pay | Admitting: Emergency Medicine

## 2017-07-01 ENCOUNTER — Other Ambulatory Visit: Payer: Self-pay

## 2017-07-01 ENCOUNTER — Ambulatory Visit
Admission: EM | Admit: 2017-07-01 | Discharge: 2017-07-01 | Disposition: A | Discharge status: Home / Self Care | Payer: 59 | Attending: Physician Assistant | Admitting: Physician Assistant

## 2017-07-01 DIAGNOSIS — L509 Urticaria, unspecified: Secondary | ICD-10-CM | POA: Diagnosis not present

## 2017-07-01 MED ORDER — HYDROXYZINE HCL 25 MG PO TABS: 25.0000 mg | ORAL_TABLET | Freq: Four times a day (QID) | ORAL | 0 refills | Status: DC | PRN

## 2017-07-01 MED ORDER — METHYLPREDNISOLONE SODIUM SUCC 40 MG IJ SOLR
80.0000 mg | Freq: Once | INTRAMUSCULAR | Status: AC
  Administered 2017-07-01: 80 mg via INTRAMUSCULAR

## 2017-07-01 MED ORDER — METHYLPREDNISOLONE SODIUM SUCC 40 MG IJ SOLR: 80.0000 mg | Freq: Once | INTRAMUSCULAR | Status: DC

## 2017-07-01 NOTE — ED Provider Notes (Signed)
[facility]    CSN: [id number] Arrival date & time: 07/01/17  1428     History   Chief Complaint Chief Complaint  Patient presents with  . Urticaria    HPI Mandy Torres is a 46 y.o. female.   Patient is a 46 year old female who presents with complaint of hives that started on Tuesday. She states that they started her legs and noted per her arms then her trunk and now her neck and onto her face. She denies starting any new medications recently. She denies any new products at home, including detergents soaps or deodorants etc. Patient also works as a Marine scientist in the surgical center and denies any new products or chemicals at work that she knows of. She denies any shortness of breath or chest pain. She does report the rash is itchy but nonpainful. She is taking Benadryl with some help with itching but the rash continues to advance.      Past Medical History:  Diagnosis Date  . ADHD (attention deficit hyperactivity disorder)    [name]  . Allergy   . Anemia   . BRCA negative 05/27/2016  . Complication of anesthesia   . Depression   . DJD (degenerative joint disease), cervical    neurosurg - Dr. Wendall Stade at [facility]  . Foot neuroma   . GERD (gastroesophageal reflux disease)   . Hiatal hernia   . History of breast biopsy 05/2001  . Hx MRSA infection    X 2-LAST HAD IN 2015  . Hypertension   . Hypothyroidism   . Migraine   . PONV (postoperative nausea and vomiting)   . Thyroid disease 2017  . TMJ disease     Patient Active Problem List   Diagnosis Date Noted  . Anal skin tag 04/14/2017  . Encounter for screening colonoscopy 04/14/2017  . Allergic rhinitis 02/14/2017  . Cervical paraspinal muscle spasm 02/14/2017  . Family history of breast cancer 11/12/2016  . Esophageal reflux   . Fatigue 11/21/2014  . Enlarged lymph node 04/12/2014  . Thyroiditis 04/02/2014  . Prediabetes 01/25/2014  . Obesity, unspecified 01/25/2014  . Abnormality of rectum  12/31/2011  . Hyperlipidemia, familial, high LDL 06/23/2011  . ADHD (attention deficit hyperactivity disorder)   . Depression   . Migraine   . HTN (hypertension) 04/03/2011    Past Surgical History:  Procedure Laterality Date  . [place] STUDY N/A 12/31/2015   Procedure: [place] STUDY;  Surgeon: Lucilla Lame, MD;  Location: [facility] ENDOSCOPY;  Service: Endoscopy;  Laterality: N/A;  . APPENDECTOMY    . BREAST SURGERY     normal biopsy 2002, left  . CHOLECYSTECTOMY    . ESOPHAGEAL MANOMETRY N/A 12/17/2015   Procedure: ESOPHAGEAL MANOMETRY (EM);  Surgeon: Lucilla Lame, MD;  Location: [facility] ENDOSCOPY;  Service: Endoscopy;  Laterality: N/A;  . FACIAL RECONSTRUCTION SURGERY  2/88, 9/88, 2/89  . HIATAL HERNIA REPAIR  02/03/2016   Arletta Bale, M.D.  . KNEE SURGERY  11/97 and 4/93   right  . METATARSAL OSTEOTOMY WITH BUNIONECTOMY  2016  . NASAL SINUS SURGERY  2006 and 1998   deviated septum, ethmoids, Dr. Caryn Section then Richardson Landry  . OOPHORECTOMY Right   . Right and Left Foot Surgery  06/2016    OB History    Gravida Para Term Preterm AB Living   0 0 0 0 0 0   SAB TAB Ectopic Multiple Live Births   0 0 0 0 0  Home Medications    Prior to Admission medications   Medication Sig Start Date End Date Taking? Authorizing Provider  Biotin 1000 MCG tablet Take 1,000 mcg by mouth daily.   Yes [provider]  CITALOPRAM HYDROBROMIDE PO Take 40 mg by mouth daily.   Yes [provider]  divalproex (DEPAKOTE) 250 MG DR tablet Take 250 mg by mouth 2 (two) times daily. One in the am and one pm   Yes [provider]  doxycycline (VIBRA-TABS) 100 MG tablet Take 1 tablet (100 mg total) 2 (two) times daily by mouth. 05/30/17  Yes Hawks, Christy A, FNP  ibuprofen (ADVIL,MOTRIN) 600 MG tablet Take 600 mg by mouth once. 08/31/16  Yes [provider]  levocetirizine (XYZAL) 5 MG tablet Take 1 tablet (5 mg total) by mouth every evening. 02/14/17  Yes Lucille Passy, MD    lurasidone (LATUDA) 20 MG TABS tablet Take 20 mg by mouth daily.    Yes [provider]  methocarbamol (ROBAXIN-750) 750 MG tablet Take 1 tablet (750 mg total) by mouth 4 (four) times daily. 02/14/17  Yes Lucille Passy, MD  norethindrone-ethinyl estradiol (MICROGESTIN,JUNEL,LOESTRIN) 1-20 MG-MCG tablet Take 1 tablet by mouth daily. 11/12/16  Yes Donnamae Jude, MD  Probiotic Product (TRUBIOTICS PO) Take by mouth.   Yes [provider]  promethazine (PHENERGAN) 25 MG tablet Take 1 tablet (25 mg total) by mouth every 8 (eight) hours as needed. 06/07/16  Yes Hyatt, Max T, DPM  pseudoephedrine (SUDAFED) 60 MG tablet Take 60 mg by mouth every 4 (four) hours as needed for congestion.   Yes [provider]  rizatriptan (MAXALT) 10 MG tablet TAKE 1 TABLET BY MOUTH AS NEEDED. REPEAT IN 2 HOURS IF NEEDED. 04/13/17  Yes Lucille Passy, MD  triamcinolone (NASACORT ALLERGY 24HR) 55 MCG/ACT AERO nasal inhaler Place 2 sprays into the nose as needed.    Yes [provider]  Vitamin D, Cholecalciferol, 400 units CAPS Take 2 capsules by mouth daily. 11/15/16  Yes Donnamae Jude, MD  Vitamin D, Ergocalciferol, (DRISDOL) 50000 units CAPS capsule Take 1 capsule (50,000 Units total) by mouth every 7 (seven) days. For 8 weeks Patient taking differently: Take 50,000 Units by mouth every Monday, Wednesday, and Friday. For 8 weeks 11/15/16  Yes Donnamae Jude, MD  hydrOXYzine (ATARAX/VISTARIL) 25 MG tablet Take 1 tablet (25 mg total) by mouth every 6 (six) hours as needed for itching. 07/01/17   Luvenia Redden, PA-C  ketoconazole (NIZORAL) 2 % shampoo  08/13/16   [provider]    Family History Family History  Problem Relation Age of Onset  . Cancer Mother 88       Breast  . Diabetes Mother   . Thyroid disease Mother   . Cancer Sister 5       Half sister, Breast  . Cancer Maternal Aunt        Breast  . Diabetes Maternal Aunt   . Thyroid disease Maternal Aunt   . Heart  disease Maternal Grandmother   . Pancreatic cancer Maternal Grandfather     Social History Social History   Tobacco Use  . Smoking status: Never Smoker  . Smokeless tobacco: Never Used  Substance Use Topics  . Alcohol use: No  . Drug use: No     Allergies   Avelox [moxifloxacin hcl in nacl]; Cheese; Endocet [oxycodone-acetaminophen]; Levofloxacin; Niacin and related; Penicillins; Percocet [oxycodone-acetaminophen]; Ranitidine; Sulfa antibiotics; Topamax; and Valium   Review  of Systems Review of Systems  As above in history of present illness. Other systems reviewed and found to be negative   Physical Exam Triage Vital Signs ED Triage Vitals  Enc Vitals Group     BP 07/01/17 1444 132/90     Pulse Rate 07/01/17 1444 95     Resp 07/01/17 1444 14     Temp 07/01/17 1444 98.2 F (36.8 C)     Temp Source 07/01/17 1444 Oral     SpO2 07/01/17 1444 100 %     Weight 07/01/17 1441 163 lb (73.9 kg)     Height 07/01/17 1441 _0  (1.575 m)     Head Circumference --      Peak Flow --      Pain Score 07/01/17 1441 0     Pain Loc --      Pain Edu? --      Excl. in [place]? --    No data found.  Updated Vital Signs BP 132/90 (BP Location: Left Arm)   Pulse 95   Temp 98.2 F (36.8 C) (Oral)   Resp 14   Ht _1  (1.575 m)   Wt 163 lb (73.9 kg)   SpO2 100%   BMI 29.81 kg/m    Physical Exam  Constitutional: She is oriented to person, place, and time. She appears well-developed and well-nourished. No distress.  HENT:  Head: Normocephalic and atraumatic.  Mouth/Throat: Uvula is midline and oropharynx is clear and moist.  Cardiovascular: Normal rate, regular rhythm and normal heart sounds.  Pulmonary/Chest: Effort normal and breath sounds normal. No stridor. No respiratory distress.  Musculoskeletal: Normal range of motion.  Neurological: She is alert and oriented to person, place, and time. No cranial nerve deficit.  Skin:  Diffuse hives that are itchy and nonpainful.      UC Treatments / Results  Labs (all labs ordered are listed, but only abnormal results are displayed) Labs Reviewed - No data to display  EKG  EKG Interpretation None       Radiology No results found.  Procedures Procedures (including critical care time)  Medications Ordered in UC Medications  methylPREDNISolone sodium succinate (SOLU-MEDROL) 40 mg/mL injection 80 mg (80 mg Intramuscular Given 07/01/17 1507)     Initial Impression / Assessment and Plan / UC Course  I have reviewed the triage vital signs and the nursing notes.  Pertinent labs & imaging results that were available during my care of the patient were reviewed by me and considered in my medical decision making (see chart for details).    Patient with diffuse hives/rash ongoing since Tuesday has continued to progress for her legs up to her neck and face now. Only some help with itching with Benadryl. This likely help would be with steroids. Patient requests a one-time injection instead of a oral course.  Final Clinical Impressions(s) / UC Diagnoses   Final diagnoses:  Hives   Give patient a dose of Solumedrol 80 mg IM. Also give her a prescription for hyroxyzine as well for her itching.  ED Discharge Orders        Ordered    hydrOXYzine (ATARAX/VISTARIL) 25 MG tablet  Every 6 hours PRN     07/01/17 1508       Controlled Substance Prescriptions [place] Controlled Substance Registry consulted? Not Applicable   Luvenia Redden, PA-C 07/01/17 1511

## 2017-07-01 NOTE — Discharge Instructions (Signed)
-  you were given a dose of Solumedrol, 80mg  IM, a steroid. -Hydroxyzine: one tablet every 6 hours as needed for itching. -the steroid injection should help alleviate your symptoms -return to clinic or follow up with PCP should symptoms worsen or not improve

## 2017-07-01 NOTE — ED Triage Notes (Signed)
Patient c/o hives that started on Tuesday.  Patient has been taking Benardyl for this and has not helped.

## 2017-07-07 ENCOUNTER — Encounter: Payer: Self-pay | Admitting: Family Medicine

## 2017-07-08 ENCOUNTER — Ambulatory Visit: Payer: Self-pay | Admitting: Nurse Practitioner

## 2017-07-08 VITALS — BP 120/80 | HR 92 | Temp 98.5°F | Resp 16

## 2017-07-08 DIAGNOSIS — L259 Unspecified contact dermatitis, unspecified cause: Secondary | ICD-10-CM

## 2017-07-08 DIAGNOSIS — F39 Unspecified mood [affective] disorder: Secondary | ICD-10-CM | POA: Diagnosis not present

## 2017-07-08 DIAGNOSIS — L509 Urticaria, unspecified: Secondary | ICD-10-CM

## 2017-07-08 DIAGNOSIS — F902 Attention-deficit hyperactivity disorder, combined type: Secondary | ICD-10-CM | POA: Diagnosis not present

## 2017-07-08 MED ORDER — PREDNISONE 10 MG (21) PO TBPK: ORAL_TABLET | ORAL | 0 refills | Status: AC

## 2017-07-08 MED ORDER — DEXAMETHASONE SODIUM PHOSPHATE 100 MG/10ML IJ SOLN
10.0000 mg | Freq: Once | INTRAMUSCULAR | Status: AC
Start: 2017-07-08 — End: 2017-07-08
  Administered 2017-07-08: 10 mg via INTRAMUSCULAR

## 2017-07-08 MED ORDER — PREDNISONE 20 MG PO TABS: 20.0000 mg | ORAL_TABLET | Freq: Every day | ORAL | 0 refills | Status: DC

## 2017-07-08 MED ORDER — HYDROXYZINE HCL 25 MG PO TABS: 25.0000 mg | ORAL_TABLET | Freq: Three times a day (TID) | ORAL | 0 refills | Status: DC | PRN

## 2017-07-08 NOTE — Progress Notes (Signed)
Subjective:     Mandy Torres is an 46 y.o. female who presents for evaluation and treatment of a rash. Onset of symptoms was several days ago, and has been improving but began to worsen approximately 2 days ago since that time. Risk factors include: patient does not know what she came into contact with, but does endorse using new cleaning solutions at home. Denies change of detergents, soaps or shampoos. Patient does state her salon dyed her hair around the same time of onset. Patient was seen at [facility] and given hydroxyzine and a steroid injection.  Today patient continues with hives covering >50-75% of her body with pruritis.   The following portions of the patient's history were reviewed and updated as appropriate: allergies, current medications and past medical history.  Review of Systems Constitutional: negative Eyes: positive for facial swelling and redness Ears, nose, mouth, throat, and face: negative Respiratory: negative Cardiovascular: negative Integument/breast: positive for pruritus and rash   Objective:    BP 120/80   Pulse 92   Temp 98.5 F (36.9 C) (Oral)   Resp 16   SpO2 100%  General appearance: alert, cooperative, moderate distress and due to pruritis Head: Normocephalic, without obvious abnormality, atraumatic Eyes: conjunctivae/corneas clear. PERRL, EOM's intact. Fundi benign., swelling around periorbital regions bilaterally Ears: normal TM's and external ear canals both ears Nose: Nares normal. Septum midline. Mucosa normal. No drainage or sinus tenderness. Throat: lips, mucosa, and tongue normal; teeth and gums normal Lungs: clear to auscultation bilaterally Heart: regular rate and rhythm, S1, S2 normal, no murmur, click, rub or gallop Pulses: 2+ and symmetric Skin: erythema - generalized, neck, arm(s) bilateral, wrist(s) bilateral, abdomen, upper leg(s) bilateral, lower leg(s) bilateral, macular - generalized and petechiae - abdomen, lower leg(s) bilateral, due  to patient scratching rash   Assessment:    Eczema, gradually worsening   Plan:    Medications: use over the counter medication: Sterapred Dose pack and Prednisone 20mg  if symptoms do not improve with sterapred.  Patient instructed to use luke warm water, avoid hot baths and use colloidal oatmeal solutions in bath.  Patient also given prescription for Hydroxyzine 25mg  for itching. and patient given Decadron injection in office. . Treatment: avoid itchy clothing (wool), use mild soaps with lotions in them ([place] - Dove) and over the counter 1% hydrocortisone. No soap, hot showers.  Avoid products containing dyes, fragrances or anti-bacterials. Good quality lotion at least twice a day. Follow up in 3 days.if no improvement.

## 2017-07-14 DIAGNOSIS — L500 Allergic urticaria: Secondary | ICD-10-CM | POA: Diagnosis not present

## 2017-07-21 ENCOUNTER — Other Ambulatory Visit: Payer: Self-pay | Admitting: Family Medicine

## 2017-07-21 ENCOUNTER — Encounter: Payer: Self-pay | Admitting: Family Medicine

## 2017-07-21 NOTE — Telephone Encounter (Signed)
This med is no longer on med list/thx dmf

## 2017-07-22 ENCOUNTER — Other Ambulatory Visit: Payer: Self-pay | Admitting: Family Medicine

## 2017-07-22 MED ORDER — HYDROCHLOROTHIAZIDE 12.5 MG PO CAPS: 12.5000 mg | ORAL_CAPSULE | Freq: Every day | ORAL | 3 refills | Status: DC

## 2017-08-05 DIAGNOSIS — F902 Attention-deficit hyperactivity disorder, combined type: Secondary | ICD-10-CM | POA: Diagnosis not present

## 2017-08-05 DIAGNOSIS — F39 Unspecified mood [affective] disorder: Secondary | ICD-10-CM | POA: Diagnosis not present

## 2017-08-17 ENCOUNTER — Ambulatory Visit: Payer: 59 | Admitting: Podiatry

## 2017-08-17 DIAGNOSIS — M779 Enthesopathy, unspecified: Principal | ICD-10-CM

## 2017-08-17 DIAGNOSIS — M7751 Other enthesopathy of right foot: Secondary | ICD-10-CM

## 2017-08-17 DIAGNOSIS — M778 Other enthesopathies, not elsewhere classified: Secondary | ICD-10-CM

## 2017-08-17 NOTE — Progress Notes (Signed)
She presents today with a chief complaint of pain to the second metatarsal phalangeal joint of the right foot.  She states that she was doing very well for quite some time until December and started to bother her again.  She thinks it may be associated with her shoe gear coming to warn.  Objective: Vital signs are stable she is alert and oriented x3.  Pulses are palpable.  She has pain on end range of motion of the second metatarsal phalangeal joint of the right foot.  No erythema cellulitis drainage or odor.  Assessment: Capsulitis second metatarsophalangeal joint right foot.  Plan: Discussed etiology pathology conservative versus surgical therapies.  At this point I performed a para-articular injection after verbal consent.  The area was wiped with alcohol and I injected 20 mg of Kenalog and 5 mg of Marcaine around the second metatarsophalangeal joint.  She tolerated procedure well will follow up with me on an as-needed basis I did discuss appropriate shoe gear with her.

## 2017-08-25 ENCOUNTER — Encounter: Payer: Self-pay | Admitting: *Deleted

## 2017-08-31 DIAGNOSIS — F902 Attention-deficit hyperactivity disorder, combined type: Secondary | ICD-10-CM | POA: Diagnosis not present

## 2017-08-31 DIAGNOSIS — F39 Unspecified mood [affective] disorder: Secondary | ICD-10-CM | POA: Diagnosis not present

## 2017-09-07 DIAGNOSIS — F39 Unspecified mood [affective] disorder: Secondary | ICD-10-CM | POA: Diagnosis not present

## 2017-09-26 ENCOUNTER — Encounter: Payer: Self-pay | Admitting: Radiology

## 2017-10-05 DIAGNOSIS — F39 Unspecified mood [affective] disorder: Secondary | ICD-10-CM | POA: Diagnosis not present

## 2017-10-19 DIAGNOSIS — F39 Unspecified mood [affective] disorder: Secondary | ICD-10-CM | POA: Diagnosis not present

## 2017-12-07 ENCOUNTER — Ambulatory Visit: Payer: 59 | Admitting: Podiatry

## 2017-12-07 ENCOUNTER — Encounter: Payer: Self-pay | Admitting: Podiatry

## 2017-12-07 DIAGNOSIS — M779 Enthesopathy, unspecified: Secondary | ICD-10-CM | POA: Diagnosis not present

## 2017-12-07 DIAGNOSIS — M778 Other enthesopathies, not elsewhere classified: Secondary | ICD-10-CM

## 2017-12-07 MED ORDER — METHYLPREDNISOLONE 4 MG PO TBPK: ORAL_TABLET | ORAL | 0 refills | Status: DC

## 2017-12-07 NOTE — Progress Notes (Signed)
She presents today chief complaint of pain to the second metatarsal phalangeal joint of the right foot.  She states that the toes are starting to separate.  Objective: Vital signs are stable alert and oriented x3.  She has pain on end range of motion of the second metatarsal phalangeal joint of the right foot.  She has medial deviation of the toe with some dorsiflexion at the level of the metatarsal phalangeal joint.  Assessment: Pain in limb secondary to capsulitis of the second metatarsal phalangeal joint.  Pre-dislocation syndrome.  Plan: Discussed etiology pathology conservative or surgical therapies.  At this point I feel is necessary to inject the area with dexamethasone and local anesthetic start her on a Medrol Dosepak.  I injected 2 mg of dexamethasone and local anesthetic to the point of maximal tenderness after sterile Betadine skin prep at the level of the second metatarsal phalangeal joint.  Also made an appointment with her to follow-up with [facility] for orthotics.  Should be a pocket cut out beneath the second metatarsal phalangeal joint of the right foot.

## 2017-12-09 ENCOUNTER — Telehealth: Payer: Self-pay | Admitting: Podiatry

## 2017-12-09 NOTE — Telephone Encounter (Signed)
Left message for pt that orthotics are covered at 80% after 300.00 deductible has been met. With a max of 500 per year.. They cost 398.00. She has an appt with Liliane Channel on [phone] and he should be able to answer any other questions or she can call me back at [phone].

## 2017-12-14 ENCOUNTER — Ambulatory Visit (INDEPENDENT_AMBULATORY_CARE_PROVIDER_SITE_OTHER): Payer: 59 | Admitting: Orthotics

## 2017-12-14 ENCOUNTER — Other Ambulatory Visit: Payer: 59 | Admitting: Orthotics

## 2017-12-14 DIAGNOSIS — M779 Enthesopathy, unspecified: Secondary | ICD-10-CM | POA: Diagnosis not present

## 2017-12-14 DIAGNOSIS — M2011 Hallux valgus (acquired), right foot: Secondary | ICD-10-CM

## 2017-12-14 DIAGNOSIS — M778 Other enthesopathies, not elsewhere classified: Secondary | ICD-10-CM

## 2017-12-14 NOTE — Progress Notes (Signed)
Saw patient today for CMFO per Dr Milinda Pointer.  Patient presents with capsulitis 2/3 right foot; plan on semirigid device with 2/3 offload.

## 2017-12-20 DIAGNOSIS — M722 Plantar fascial fibromatosis: Secondary | ICD-10-CM

## 2017-12-23 DIAGNOSIS — F39 Unspecified mood [affective] disorder: Secondary | ICD-10-CM | POA: Diagnosis not present

## 2017-12-23 DIAGNOSIS — F902 Attention-deficit hyperactivity disorder, combined type: Secondary | ICD-10-CM | POA: Diagnosis not present

## 2018-01-02 ENCOUNTER — Telehealth: Payer: 59 | Admitting: Family Medicine

## 2018-01-02 DIAGNOSIS — J329 Chronic sinusitis, unspecified: Secondary | ICD-10-CM | POA: Diagnosis not present

## 2018-01-02 MED ORDER — DOXYCYCLINE HYCLATE 100 MG PO TABS
100.0000 mg | ORAL_TABLET | Freq: Two times a day (BID) | ORAL | 0 refills | Status: DC
Start: 2018-01-02 — End: 2018-01-25

## 2018-01-02 NOTE — Progress Notes (Signed)

## 2018-01-04 ENCOUNTER — Ambulatory Visit: Payer: 59 | Admitting: Orthotics

## 2018-01-04 ENCOUNTER — Other Ambulatory Visit: Payer: 59 | Admitting: Orthotics

## 2018-01-04 DIAGNOSIS — M2011 Hallux valgus (acquired), right foot: Secondary | ICD-10-CM

## 2018-01-04 DIAGNOSIS — Z1211 Encounter for screening for malignant neoplasm of colon: Secondary | ICD-10-CM

## 2018-01-04 DIAGNOSIS — Z9889 Other specified postprocedural states: Secondary | ICD-10-CM

## 2018-01-04 NOTE — Progress Notes (Signed)
Patient came in today to pick up custom made foot orthotics.  The goals were accomplished and the patient reported no dissatisfaction with said orthotics.  Patient was advised of breakin period and how to report any issues.  Added valgus wedge to help with rolling outl

## 2018-01-09 ENCOUNTER — Other Ambulatory Visit: Payer: Self-pay | Admitting: Family Medicine

## 2018-01-09 DIAGNOSIS — Z3041 Encounter for surveillance of contraceptive pills: Secondary | ICD-10-CM

## 2018-01-17 ENCOUNTER — Ambulatory Visit
Admission: EM | Admit: 2018-01-17 | Discharge: 2018-01-17 | Disposition: A | Discharge status: Home / Self Care | Payer: 59 | Attending: Family Medicine | Admitting: Family Medicine

## 2018-01-17 ENCOUNTER — Other Ambulatory Visit: Payer: Self-pay

## 2018-01-17 ENCOUNTER — Encounter: Payer: Self-pay | Admitting: Emergency Medicine

## 2018-01-17 DIAGNOSIS — J0100 Acute maxillary sinusitis, unspecified: Secondary | ICD-10-CM | POA: Diagnosis not present

## 2018-01-17 MED ORDER — CLINDAMYCIN HCL 300 MG PO CAPS: 300.0000 mg | ORAL_CAPSULE | Freq: Three times a day (TID) | ORAL | 0 refills | Status: DC

## 2018-01-17 MED ORDER — PREDNISONE 20 MG PO TABS: 20.0000 mg | ORAL_TABLET | Freq: Every day | ORAL | 0 refills | Status: DC

## 2018-01-17 NOTE — ED Triage Notes (Signed)
Pt here today c/o sinus pain, pressure and congestion. She was treated with doxy completed it last week. She is not any better. She is still having teeth pain. No fevers, chills.

## 2018-01-17 NOTE — ED Provider Notes (Signed)
[facility]    CSN: [id number] Arrival date & time: 01/17/18  1931     History   Chief Complaint Chief Complaint  Patient presents with  . Sinus Problem    HPI Mandy Torres is a 47 y.o. female.   The history is provided by the patient.  Sinus Problem  Associated symptoms include headaches.  URI  Presenting symptoms: congestion and facial pain   Severity:  Moderate Onset quality:  Sudden Duration:  3 weeks Timing:  Constant Progression:  Partially resolved (completed doxycycline course recently and improved however not resolved and now worsening) Chronicity:  Recurrent (has a h/o chronic sinus problems and been treated by ENT) Associated symptoms: headaches and sinus pain     Past Medical History:  Diagnosis Date  . ADHD (attention deficit hyperactivity disorder)    [name]  . Allergy   . Anemia   . BRCA negative 05/27/2016   variant of uncertain significance found in BRCA2  . Complication of anesthesia   . Depression   . DJD (degenerative joint disease), cervical    neurosurg - Dr. Wendall Stade at [facility]  . Foot neuroma   . GERD (gastroesophageal reflux disease)   . Hiatal hernia   . History of breast biopsy 05/2001  . Hx MRSA infection    X 2-LAST HAD IN 2015  . Hypertension   . Hypothyroidism   . Migraine   . PONV (postoperative nausea and vomiting)   . Thyroid disease 2017  . TMJ disease     Patient Active Problem List   Diagnosis Date Noted  . Anal skin tag 04/14/2017  . Encounter for screening colonoscopy 04/14/2017  . Allergic rhinitis 02/14/2017  . Cervical paraspinal muscle spasm 02/14/2017  . Family history of breast cancer 11/12/2016  . Esophageal reflux   . Fatigue 11/21/2014  . Enlarged lymph node 04/12/2014  . Thyroiditis 04/02/2014  . Prediabetes 01/25/2014  . Obesity, unspecified 01/25/2014  . Abnormality of rectum 12/31/2011  . Hyperlipidemia, familial, high LDL 06/23/2011  . ADHD (attention deficit hyperactivity  disorder)   . Depression   . Migraine   . HTN (hypertension) 04/03/2011    Past Surgical History:  Procedure Laterality Date  . [place] STUDY N/A 12/31/2015   Procedure: [place] STUDY;  Surgeon: Lucilla Lame, MD;  Location: [facility] ENDOSCOPY;  Service: Endoscopy;  Laterality: N/A;  . APPENDECTOMY    . BREAST SURGERY     normal biopsy 2002, left  . CHOLECYSTECTOMY    . ESOPHAGEAL MANOMETRY N/A 12/17/2015   Procedure: ESOPHAGEAL MANOMETRY (EM);  Surgeon: Lucilla Lame, MD;  Location: [facility] ENDOSCOPY;  Service: Endoscopy;  Laterality: N/A;  . FACIAL RECONSTRUCTION SURGERY  2/88, 9/88, 2/89  . HIATAL HERNIA REPAIR  02/03/2016   Arletta Bale, M.D.  . KNEE SURGERY  11/97 and 4/93   right  . METATARSAL OSTEOTOMY WITH BUNIONECTOMY  2016  . NASAL SINUS SURGERY  2006 and 1998   deviated septum, ethmoids, Dr. Caryn Section then Richardson Landry  . OOPHORECTOMY Right   . Right and Left Foot Surgery  06/2016    OB History    Gravida  0   Para  0   Term  0   Preterm  0   AB  0   Living  0     SAB  0   TAB  0   Ectopic  0   Multiple  0   Live Births  0  Home Medications    Prior to Admission medications   Medication Sig Start Date End Date Taking? Authorizing Provider  CITALOPRAM HYDROBROMIDE PO Take 40 mg by mouth daily.   Yes [provider]  divalproex (DEPAKOTE) 250 MG DR tablet Take 250 mg by mouth 2 (two) times daily. One in the am and one pm   Yes [provider]  ibuprofen (ADVIL,MOTRIN) 600 MG tablet Take 600 mg by mouth once. 08/31/16  Yes [provider]  ketoconazole (NIZORAL) 2 % shampoo  08/13/16  Yes [provider]  levocetirizine (XYZAL) 5 MG tablet Take 1 tablet (5 mg total) by mouth every evening. 02/14/17  Yes Lucille Passy, MD  methocarbamol (ROBAXIN-750) 750 MG tablet Take 1 tablet (750 mg total) by mouth 4 (four) times daily. 02/14/17  Yes Lucille Passy, MD  norethindrone-ethinyl estradiol (MICROGESTIN,JUNEL,LOESTRIN)  1-20 MG-MCG tablet TAKE 1 TABLET BY MOUTH DAILY. 01/09/18  Yes Donnamae Jude, MD  promethazine (PHENERGAN) 25 MG tablet Take 1 tablet (25 mg total) by mouth every 8 (eight) hours as needed. 06/07/16  Yes Hyatt, Max T, DPM  pseudoephedrine (SUDAFED) 60 MG tablet Take 60 mg by mouth every 4 (four) hours as needed for congestion.   Yes [provider]  rizatriptan (MAXALT) 10 MG tablet TAKE 1 TABLET BY MOUTH AS NEEDED. REPEAT IN 2 HOURS IF NEEDED. 04/13/17  Yes Lucille Passy, MD  triamcinolone (NASACORT ALLERGY 24HR) 55 MCG/ACT AERO nasal inhaler Place 2 sprays into the nose as needed.    Yes [provider]  Biotin 1000 MCG tablet Take 1,000 mcg by mouth daily.    [provider]  clindamycin (CLEOCIN) 300 MG capsule Take 1 capsule (300 mg total) by mouth 3 (three) times daily. 01/17/18   Norval Gable, MD  CONCERTA 18 MG CR tablet  12/23/17   [provider]  doxycycline (VIBRA-TABS) 100 MG tablet Take 1 tablet (100 mg total) 2 (two) times daily by mouth. 05/30/17   Hawks, Theador Hawthorne, FNP  doxycycline (VIBRA-TABS) 100 MG tablet Take 1 tablet (100 mg total) by mouth 2 (two) times daily. 01/02/18   Shella Maxim, NP  hydrochlorothiazide (MICROZIDE) 12.5 MG capsule Take 1 capsule (12.5 mg total) by mouth daily. 07/22/17   Lucille Passy, MD  hydrOXYzine (ATARAX/VISTARIL) 25 MG tablet Take 1 tablet (25 mg total) by mouth 3 (three) times daily as needed. 07/08/17   Kara Dies, NP  methylPREDNISolone (MEDROL DOSEPAK) 4 MG TBPK tablet 6 day dose pack - take as directed 12/07/17   Hyatt, Max T, DPM  predniSONE (DELTASONE) 20 MG tablet Take 1 tablet (20 mg total) by mouth daily. 01/17/18   Norval Gable, MD  Probiotic Product (TRUBIOTICS PO) Take by mouth.    [provider]  Vitamin D, Cholecalciferol, 400 units CAPS Take 2 capsules by mouth daily. 11/15/16   Donnamae Jude, MD  Vitamin D, Ergocalciferol, (DRISDOL) 50000 units CAPS capsule Take 1 capsule (50,000  Units total) by mouth every 7 (seven) days. For 8 weeks Patient taking differently: Take 50,000 Units by mouth every Monday, Wednesday, and Friday. For 8 weeks 11/15/16   Donnamae Jude, MD    Family History Family History  Problem Relation Age of Onset  . Cancer Mother 74       Breast  . Diabetes Mother   . Thyroid disease Mother   . Cancer Sister 29       Half sister, Breast  . Cancer Maternal  Aunt        Breast  . Diabetes Maternal Aunt   . Thyroid disease Maternal Aunt   . Heart disease Maternal Grandmother   . Pancreatic cancer Maternal Grandfather     Social History Social History   Tobacco Use  . Smoking status: Never Smoker  . Smokeless tobacco: Never Used  Substance Use Topics  . Alcohol use: No  . Drug use: No     Allergies   Avelox [moxifloxacin hcl in nacl]; Cheese; Endocet [oxycodone-acetaminophen]; Epitol [carbamazepine]; Levofloxacin; Niacin and related; Penicillins; Percocet [oxycodone-acetaminophen]; Ranitidine; Sulfa antibiotics; Topamax; and Valium   Review of Systems Review of Systems  HENT: Positive for congestion and sinus pain.   Neurological: Positive for headaches.     Physical Exam Triage Vital Signs ED Triage Vitals  Enc Vitals Group     BP 01/17/18 2005 131/80     Pulse Rate 01/17/18 2005 90     Resp 01/17/18 2005 16     Temp 01/17/18 2005 98.2 F (36.8 C)     Temp Source 01/17/18 2005 Oral     SpO2 01/17/18 2005 100 %     Weight 01/17/18 2006 165 lb (74.8 kg)     Height 01/17/18 2006 5' 2"  (1.575 m)     Head Circumference --      Peak Flow --      Pain Score 01/17/18 2003 3     Pain Loc --      Pain Edu? --      Excl. in [place]? --    No data found.  Updated Vital Signs BP 131/80 (BP Location: Right Arm)   Pulse 90   Temp 98.2 F (36.8 C) (Oral)   Resp 16   Ht 5' 2"  (1.575 m)   Wt 165 lb (74.8 kg)   SpO2 100%   BMI 30.18 kg/m   Visual Acuity Right Eye Distance:   Left Eye Distance:   Bilateral Distance:    Right  Eye Near:   Left Eye Near:    Bilateral Near:     Physical Exam  Constitutional: She appears well-developed and well-nourished. No distress.  HENT:  Head: Normocephalic and atraumatic.  Right Ear: Tympanic membrane, external ear and ear canal normal.  Left Ear: Tympanic membrane, external ear and ear canal normal.  Nose: Mucosal edema and rhinorrhea present. No nose lacerations, sinus tenderness, nasal deformity, septal deviation or nasal septal hematoma. No epistaxis.  No foreign bodies. Right sinus exhibits maxillary sinus tenderness and frontal sinus tenderness. Left sinus exhibits maxillary sinus tenderness and frontal sinus tenderness.  Mouth/Throat: Uvula is midline, oropharynx is clear and moist and mucous membranes are normal. No oropharyngeal exudate.  Eyes: Pupils are equal, round, and reactive to light. Conjunctivae and EOM are normal. Right eye exhibits no discharge. Left eye exhibits no discharge. No scleral icterus.  Neck: Normal range of motion. Neck supple. No thyromegaly present.  Cardiovascular: Normal rate, regular rhythm and normal heart sounds.  Pulmonary/Chest: Effort normal and breath sounds normal. No respiratory distress. She has no wheezes. She has no rales.  Lymphadenopathy:    She has no cervical adenopathy.  Skin: She is not diaphoretic.  Nursing note and vitals reviewed.    UC Treatments / Results  Labs (all labs ordered are listed, but only abnormal results are displayed) Labs Reviewed - No data to display  EKG None  Radiology No results found.  Procedures Procedures (including critical care time)  Medications Ordered in UC Medications -  No data to display  Initial Impression / Assessment and Plan / UC Course  I have reviewed the triage vital signs and the nursing notes.  Pertinent labs & imaging results that were available during my care of the patient were reviewed by me and considered in my medical decision making (see chart for  details).      Final Clinical Impressions(s) / UC Diagnoses   Final diagnoses:  Acute maxillary sinusitis, recurrence not specified   Discharge Instructions   None    ED Prescriptions    Medication Sig Dispense Auth. Provider   clindamycin (CLEOCIN) 300 MG capsule Take 1 capsule (300 mg total) by mouth 3 (three) times daily. 30 capsule Norval Gable, MD   predniSONE (DELTASONE) 20 MG tablet Take 1 tablet (20 mg total) by mouth daily. 5 tablet Norval Gable, MD     1. diagnosis reviewed with patient 2. rx as per orders above; reviewed possible side effects, interactions, risks and benefits  3. Recommend supportive treatment with otc nasal steroid 4. Follow-up prn if symptoms worsen or don't improve  Controlled Substance Prescriptions [place] Controlled Substance Registry consulted? Not Applicable   Norval Gable, MD 01/17/18 2037

## 2018-01-18 ENCOUNTER — Encounter (INDEPENDENT_AMBULATORY_CARE_PROVIDER_SITE_OTHER): Payer: Self-pay

## 2018-01-18 ENCOUNTER — Encounter: Payer: Self-pay | Admitting: Student

## 2018-01-18 ENCOUNTER — Ambulatory Visit (INDEPENDENT_AMBULATORY_CARE_PROVIDER_SITE_OTHER): Payer: 59 | Admitting: Student

## 2018-01-18 VITALS — BP 128/68 | HR 72 | Wt 165.0 lb

## 2018-01-18 DIAGNOSIS — Z1151 Encounter for screening for human papillomavirus (HPV): Secondary | ICD-10-CM

## 2018-01-18 DIAGNOSIS — Z01419 Encounter for gynecological examination (general) (routine) without abnormal findings: Secondary | ICD-10-CM | POA: Diagnosis not present

## 2018-01-18 DIAGNOSIS — Z0000 Encounter for general adult medical examination without abnormal findings: Secondary | ICD-10-CM

## 2018-01-18 DIAGNOSIS — Z124 Encounter for screening for malignant neoplasm of cervix: Secondary | ICD-10-CM | POA: Diagnosis not present

## 2018-01-18 NOTE — Progress Notes (Signed)
YTLast pap 11/2016 mammogram

## 2018-01-18 NOTE — Patient Instructions (Signed)
Preventing Cervical Cancer Cervical cancer is cancer that grows on the cervix. The cervix is at the bottom of the uterus. It connects the uterus to the vagina. The uterus is where a baby develops during pregnancy. Cancer occurs when cells become abnormal and start to grow out of control. Cervical cancer grows slowly and may not cause any symptoms at first. Over time, the cancer can grow deep into the cervix tissue and spread to other areas. If it is found early, cervical cancer can be treated effectively. You can also take steps to prevent this type of cancer. Most cases of cervical cancer are caused by an STI (sexually transmitted infection) called human papillomavirus (HPV). One way to reduce your risk of cervical cancer is to avoid infection with the HPV virus. You can do this by practicing safe sex and by getting the HPV vaccine. Getting regular Pap tests is also important because this can help identify changes in cells that could lead to cancer. Your chances of getting this disease can also be reduced by making certain lifestyle changes. How can I protect myself from cervical cancer? Preventing HPV infection  Ask your health care provider about getting the HPV vaccine. If you are 23 years old or younger, you may need to get this vaccine, which is given in three doses over 6 months. This vaccine protects against the types of HPV that could cause cancer.  Limit the number of people you have sex with. Also avoid having sex with people who have had many sex partners.  Use a latex condom during sex. Getting Pap tests  Get Pap tests regularly, starting at age 11. Talk with your health care provider about how often you need these tests. ? Most women who are 18?49 years of age should have a Pap test every 3 years. ? Most women who are 72?49 years of age should have a Pap test in combination with an HPV test every 5 years. ? Women with a higher risk of cervical cancer, such as those with a weakened  immune system or those who have been exposed to the drug diethylstilbestrol (DES), may need more frequent testing. Making other lifestyle changes  Do not use any products that contain nicotine or tobacco, such as cigarettes and e-cigarettes. If you need help quitting, ask your health care provider.  Eat at least 5 servings of fruits and vegetables every day.  Lose weight if you are overweight. Why are these changes important?  These changes and screening tests are designed to address the factors that are known to increase the risk of cervical cancer. Taking these steps is the best way to reduce your risk.  Having regular Pap tests will help identify changes in cells that could lead to cancer. Steps can then be taken to prevent cancer from developing.  These changes will also help find cervical cancer early. This type of cancer can be treated effectively if it is found early. It can be more dangerous and difficult to treat if cancer has grown deep into your cervix or has spread.  In addition to making you less likely to get cervical cancer, these changes will also provide other health benefits, such as the following: ? Practicing safe sex is important for preventing STIs and unplanned pregnancies. ? Avoiding tobacco can reduce your risk for other cancers and health issues. ? Eating a healthy diet and maintaining a healthy weight are good for your overall health. What can happen if changes are not made? In the  early stages, cervical cancer might not have any symptoms. It can take many years for the cancer to grow and get deep into the cervix tissue. This may be happening without you knowing about it. If you develop any symptoms, such as pelvic pain or unusual discharge or bleeding from your vagina, you should see your health care provider right away. If cervical cancer is not found early, you might need treatments such as radiation, chemotherapy, or surgery. In some cases, surgery may mean that  you will not be able to get pregnant or carry a pregnancy to term. Where to find support: Talk with your health care provider, school nurse, or local health department for guidance about screening and vaccination. Some children and teens may be able to get the HPV vaccine free of charge through the U.S. government's Vaccines for Children (VFC) program. Other places that provide vaccinations include:  Public health clinics. Check with your local health department.  Federally Qualified Health Centers, where you would pay only what you can afford. To find one near you, check this website: www.fqhc.org/find-an-fqhc/  Rural Health Clinics. These are part of a program for Medicare and Medicaid patients who live in rural areas.  The National Breast and Cervical Cancer Early Detection Program also provides breast and cervical cancer screenings and diagnostic services to low-income, uninsured, and underinsured women. Cervical cancer can be passed down through families. Talk with your health care provider or genetic counselor to learn more about genetic testing for cancer. Where to find more information: Learn more about cervical cancer from:  American College of Gynecology: www.acog.org/Patients/FAQs/Cervical-Cancer  American Cancer Society: www.cancer.org/cancer/cervicalcancer/  U.S. Centers for Disease Control and Prevention: www.cdc.gov/cancer/cervical/  Summary  Talk with your health care provider about getting the HPV vaccine.  Be sure to get regular Pap tests as recommended by your health care provider.  See your health care provider right away if you have any pelvic pain or unusual discharge or bleeding from your vagina. This information is not intended to replace advice given to you by your health care provider. Make sure you discuss any questions you have with your health care provider. Document Released: 07/13/2015 Document Revised: 02/24/2016 Document Reviewed: 02/24/2016 Elsevier  Interactive Patient Education  2018 Elsevier Inc.  

## 2018-01-18 NOTE — Progress Notes (Signed)
Patient ID: Mandy Torres, female   DOB: 1970-11-15, 47 y.o.   MRN: [id number] History:  [name] is a 47 y.o. [id number] who presents to clinic today for repeat pap and mammogram order. Declines any other STI testing. Currently taking OCPS;   The following portions of the patient's history were reviewed and updated as appropriate: allergies, current medications, family history, past medical history, social history, past surgical history and problem list.  Review of Systems:  Review of Systems  Constitutional: Negative.   HENT: Negative.   Eyes: Negative.   Respiratory: Negative.   Cardiovascular: Negative.   Gastrointestinal: Negative.   Genitourinary: Negative.   Musculoskeletal: Negative.   Skin: Negative.   Neurological: Negative.       Objective:  Physical Exam BP 128/68   Pulse 72   Wt 165 lb (74.8 kg)   BMI 30.18 kg/m  Physical Exam  Constitutional: She appears well-developed.  HENT:  Head: Normocephalic.  Neck: Normal range of motion.  Abdominal: Soft.  Genitourinary:  Genitourinary Comments: Normal external female genitalia; no lesions on labia or vaginal walls or cervix. Cervix is pink and non-tender. No suprapubic adnexal or CMT.   Musculoskeletal: Normal range of motion.  Neurological: She is alert.  Skin: Skin is warm.  Psychiatric: She has a normal mood and affect.     Labs and Imaging No results found for this or any previous visit (from the past 24 hour(s)).  No results found.   Assessment & Plan:  1. Annual physical exam Doing well; continue OCPs.  - Cytology - PAP - MM Digital Screening; Future   Starr Lake, CNM 01/18/2018 5:37 PM

## 2018-01-20 DIAGNOSIS — F902 Attention-deficit hyperactivity disorder, combined type: Secondary | ICD-10-CM | POA: Diagnosis not present

## 2018-01-20 DIAGNOSIS — F39 Unspecified mood [affective] disorder: Secondary | ICD-10-CM | POA: Diagnosis not present

## 2018-01-20 LAB — CYTOLOGY - PAP
Diagnosis: NEGATIVE
HPV: NOT DETECTED

## 2018-01-25 ENCOUNTER — Encounter: Payer: Self-pay | Admitting: Podiatry

## 2018-01-25 ENCOUNTER — Encounter: Payer: Self-pay | Admitting: Nurse Practitioner

## 2018-01-25 ENCOUNTER — Ambulatory Visit: Payer: 59 | Admitting: Nurse Practitioner

## 2018-01-25 ENCOUNTER — Ambulatory Visit: Payer: 59 | Admitting: Podiatry

## 2018-01-25 ENCOUNTER — Ambulatory Visit
Admission: RE | Admit: 2018-01-25 | Discharge: 2018-01-25 | Disposition: A | Discharge status: Home / Self Care | Payer: 59 | Source: Ambulatory Visit | Attending: Nurse Practitioner | Admitting: Nurse Practitioner

## 2018-01-25 VITALS — BP 110/70 | HR 83 | Temp 98.4°F | Ht 62.0 in | Wt 162.0 lb

## 2018-01-25 DIAGNOSIS — J0141 Acute recurrent pansinusitis: Secondary | ICD-10-CM | POA: Insufficient documentation

## 2018-01-25 DIAGNOSIS — E876 Hypokalemia: Secondary | ICD-10-CM

## 2018-01-25 DIAGNOSIS — R413 Other amnesia: Secondary | ICD-10-CM

## 2018-01-25 DIAGNOSIS — R0989 Other specified symptoms and signs involving the circulatory and respiratory systems: Secondary | ICD-10-CM | POA: Diagnosis not present

## 2018-01-25 DIAGNOSIS — K118 Other diseases of salivary glands: Secondary | ICD-10-CM | POA: Insufficient documentation

## 2018-01-25 DIAGNOSIS — E559 Vitamin D deficiency, unspecified: Secondary | ICD-10-CM

## 2018-01-25 DIAGNOSIS — M779 Enthesopathy, unspecified: Secondary | ICD-10-CM | POA: Diagnosis not present

## 2018-01-25 DIAGNOSIS — M778 Other enthesopathies, not elsewhere classified: Secondary | ICD-10-CM

## 2018-01-25 LAB — CBC WITH DIFFERENTIAL/PLATELET
Basophils Absolute: 0.1 10*3/uL (ref 0.0–0.1)
Basophils Relative: 1.3 % (ref 0.0–3.0)
EOS PCT: 1.8 % (ref 0.0–5.0)
Eosinophils Absolute: 0.1 10*3/uL (ref 0.0–0.7)
HEMATOCRIT: 39.2 % (ref 36.0–46.0)
HEMOGLOBIN: 13.6 g/dL (ref 12.0–15.0)
Lymphocytes Relative: 32 % (ref 12.0–46.0)
Lymphs Abs: 1.8 10*3/uL (ref 0.7–4.0)
MCHC: 34.7 g/dL (ref 30.0–36.0)
MCV: 95.6 fl (ref 78.0–100.0)
MONOS PCT: 11.8 % (ref 3.0–12.0)
Monocytes Absolute: 0.7 10*3/uL (ref 0.1–1.0)
Neutro Abs: 3 10*3/uL (ref 1.4–7.7)
Neutrophils Relative %: 53.1 % (ref 43.0–77.0)
Platelets: 320 10*3/uL (ref 150.0–400.0)
RBC: 4.11 Mil/uL (ref 3.87–5.11)
RDW: 12.4 % (ref 11.5–15.5)
WBC: 5.7 10*3/uL (ref 4.0–10.5)

## 2018-01-25 LAB — COMPREHENSIVE METABOLIC PANEL
ALBUMIN: 3.8 g/dL (ref 3.5–5.2)
ALT: 19 U/L (ref 0–35)
AST: 16 U/L (ref 0–37)
Alkaline Phosphatase: 39 U/L (ref 39–117)
BUN: 10 mg/dL (ref 6–23)
CHLORIDE: 87 meq/L — AB (ref 96–112)
CO2: 34 mEq/L — ABNORMAL HIGH (ref 19–32)
Calcium: 8.3 mg/dL — ABNORMAL LOW (ref 8.4–10.5)
Creatinine, Ser: 0.71 mg/dL (ref 0.40–1.20)
GFR: 93.03 mL/min (ref >60.00)
Glucose, Bld: 84 mg/dL (ref 70–99)
POTASSIUM: 3.2 meq/L — AB (ref 3.5–5.1)
Sodium: 127 mEq/L — ABNORMAL LOW (ref 135–145)
Total Bilirubin: 0.3 mg/dL (ref 0.2–1.2)
Total Protein: 6 g/dL (ref 6.0–8.3)

## 2018-01-25 LAB — VITAMIN B12: VITAMIN B 12: 412 pg/mL (ref 211–911)

## 2018-01-25 LAB — TSH: TSH: 1.07 u[IU]/mL (ref 0.35–4.50)

## 2018-01-25 MED ORDER — POTASSIUM CHLORIDE CRYS ER 20 MEQ PO TBCR: 40.0000 meq | EXTENDED_RELEASE_TABLET | Freq: Every day | ORAL | 0 refills | Status: DC

## 2018-01-25 NOTE — Progress Notes (Signed)
She presents today for follow-up of capsulitis second metatarsophalangeal joint of the right foot she states that it is so much better can hardly believe it.  She is not feeling well today because of sinus infection.  Objective: Vital signs are stable alert and oriented x3.  Pulses are palpable.  There is no erythema edema cellulitis drainage or odor she still has medial deviation and dorsal dislocation of the second metatarsal phalangeal joint of the right second toe.  Much less tender on palpation and range of motion and previously noted.  Assessment: Slowly resolving capsulitis.  Plan: Continue the use of the orthotics and follow-up with [facility] for permanent fix.

## 2018-01-25 NOTE — Patient Instructions (Addendum)
Normal CBC, TSH, and B12. Pending RPR, vitamin D and CT sinus CMP indicates low potassium, chloride, and sodium. Hold HCTZ x 1week,  I sent potassium supplement x 3days. Return to lab for repeat BMP in 1week.  No sinus disease. Used migraine medication as prescribed. Entered referral to neurology
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
## 2018-01-28 LAB — VITAMIN D 1,25 DIHYDROXY
VITAMIN D2 1, 25 (OH): 11 pg/mL
Vitamin D 1, 25 (OH)2 Total: 31 pg/mL (ref 18–72)
Vitamin D3 1, 25 (OH)2: 20 pg/mL

## 2018-01-28 LAB — RPR: RPR Ser Ql: NONREACTIVE

## 2018-02-01 ENCOUNTER — Other Ambulatory Visit (INDEPENDENT_AMBULATORY_CARE_PROVIDER_SITE_OTHER): Payer: 59

## 2018-02-01 DIAGNOSIS — E876 Hypokalemia: Secondary | ICD-10-CM

## 2018-02-01 LAB — BASIC METABOLIC PANEL
BUN: 10 mg/dL (ref 6–23)
CALCIUM: 8.4 mg/dL (ref 8.4–10.5)
CO2: 29 mEq/L (ref 19–32)
Chloride: 97 mEq/L (ref 96–112)
Creatinine, Ser: 0.72 mg/dL (ref 0.40–1.20)
GFR: 91.54 mL/min (ref >60.00)
Glucose, Bld: 84 mg/dL (ref 70–99)
Potassium: 4.6 mEq/L (ref 3.5–5.1)
SODIUM: 131 meq/L — AB (ref 135–145)

## 2018-03-01 ENCOUNTER — Encounter: Payer: Self-pay | Admitting: Podiatry

## 2018-03-01 ENCOUNTER — Ambulatory Visit: Payer: 59 | Admitting: Podiatry

## 2018-03-01 DIAGNOSIS — M778 Other enthesopathies, not elsewhere classified: Secondary | ICD-10-CM

## 2018-03-01 DIAGNOSIS — M779 Enthesopathy, unspecified: Secondary | ICD-10-CM

## 2018-03-01 NOTE — Progress Notes (Signed)
She presents today for follow-up of her capsulitis second metatarsal phalangeal joint of the right foot.  She states that I flared it up last week and is been swollen and painful ever since.  Like to consider an injection.  Objective: Vital signs are stable alert and oriented x3.  Pulses are palpable.  Neurologic sensorium is intact.  Deep tendon reflexes are intact muscle strength is normal and symmetrical.  Orthopedic evaluation demonstrates all joints distal to the ankle full range of motion without crepitus she has dorsal flexion of the second toe at the metatarsal phalangeal joint with medial deviation.  It appears to be dislocating.  Assessment: Plantar plate tear most likely second metatarsophalangeal joint right foot.  Plan: Discussed etiology pathology conservative surgical therapies at this point I want to go ahead and get her an MRI second metatarsal phalangeal joint area for surgical consideration.

## 2018-03-10 DIAGNOSIS — E063 Autoimmune thyroiditis: Secondary | ICD-10-CM | POA: Diagnosis not present

## 2018-03-10 DIAGNOSIS — Z8349 Family history of other endocrine, nutritional and metabolic diseases: Secondary | ICD-10-CM | POA: Diagnosis not present

## 2018-03-14 ENCOUNTER — Telehealth: Payer: Self-pay | Admitting: Podiatry

## 2018-03-14 ENCOUNTER — Other Ambulatory Visit: Payer: Self-pay

## 2018-03-14 DIAGNOSIS — M779 Enthesopathy, unspecified: Principal | ICD-10-CM

## 2018-03-14 DIAGNOSIS — M778 Other enthesopathies, not elsewhere classified: Secondary | ICD-10-CM

## 2018-03-14 NOTE — Telephone Encounter (Signed)
Pt was calling about MRI for right foot. She was seen 03/01/2018. Please give her a call back

## 2018-03-15 NOTE — Telephone Encounter (Signed)
Patient notified that no precert required for MRI and she can call and schedule Mri to her convenience

## 2018-03-15 NOTE — Telephone Encounter (Signed)
This patient was seen two weeks ago.  Why is this so late being ordered?

## 2018-03-20 ENCOUNTER — Telehealth: Payer: Self-pay | Admitting: Podiatry

## 2018-03-20 ENCOUNTER — Other Ambulatory Visit: Payer: Self-pay

## 2018-03-20 DIAGNOSIS — M216X2 Other acquired deformities of left foot: Secondary | ICD-10-CM

## 2018-03-20 NOTE — Telephone Encounter (Signed)
Pt called and said that she has changed her orders for the mri to [place] but they are stating that you will need to make a new order to [place]. They cannot accept the order that was just changed

## 2018-03-20 NOTE — Telephone Encounter (Signed)
Location has been changed in MRI order.  Patient has been notified via voice mail and she can contact schedule to set up appt to her convenience

## 2018-03-20 NOTE — Telephone Encounter (Signed)
Pt would like MRI referral to go to [facility] being that she can get it done this week and she wouldn't have to take off of work. Please give her a call

## 2018-03-20 NOTE — Telephone Encounter (Signed)
New order has been entered in chart and patient has been notified

## 2018-03-21 ENCOUNTER — Ambulatory Visit (HOSPITAL_COMMUNITY)
Admission: RE | Admit: 2018-03-21 | Discharge: 2018-03-21 | Disposition: A | Discharge status: Home / Self Care | Payer: 59 | Source: Ambulatory Visit | Attending: Podiatry | Admitting: Podiatry

## 2018-03-21 DIAGNOSIS — S93129A Dislocation of metatarsophalangeal joint of unspecified toe(s), initial encounter: Secondary | ICD-10-CM | POA: Diagnosis not present

## 2018-03-21 DIAGNOSIS — M216X2 Other acquired deformities of left foot: Secondary | ICD-10-CM | POA: Insufficient documentation

## 2018-03-21 DIAGNOSIS — X58XXXA Exposure to other specified factors, initial encounter: Secondary | ICD-10-CM | POA: Insufficient documentation

## 2018-03-21 DIAGNOSIS — R600 Localized edema: Secondary | ICD-10-CM | POA: Insufficient documentation

## 2018-03-27 ENCOUNTER — Ambulatory Visit: Payer: 59 | Admitting: Podiatry

## 2018-03-27 ENCOUNTER — Ambulatory Visit: Admission: RE | Admit: 2018-03-27 | Payer: 59 | Source: Ambulatory Visit

## 2018-03-29 ENCOUNTER — Encounter: Payer: Self-pay | Admitting: Podiatry

## 2018-03-29 ENCOUNTER — Ambulatory Visit: Payer: 59 | Admitting: Podiatry

## 2018-03-29 DIAGNOSIS — M778 Other enthesopathies, not elsewhere classified: Secondary | ICD-10-CM

## 2018-03-29 DIAGNOSIS — M779 Enthesopathy, unspecified: Secondary | ICD-10-CM | POA: Diagnosis not present

## 2018-03-29 NOTE — Progress Notes (Signed)
She presents today for surgical consult regarding painful second metatarsal phalangeal joint of the right foot.  This is been no change in her past medical history medications allergy surgeries or social history.  Objective: She still has pain on palpation second metatarsophalangeal joint with a cock-up overlapping second toe right.  MRI does indicate that there is complete rupture of the second metatarsal phalangeal joint of the right foot.  Assessment: Complete dislocation second metatarsal phalangeal joint right foot.  Plan: Discussed etiology pathology conservative surgical therapies at this point I went ahead and consented her for a second metatarsal osteotomy and a pinning of the second toe with primary repair of the medial lateral collateral ligaments and is much of the plantar plate as I can repair.  She understands and is amenable to it we did discuss the possible postop complications which may include but are not limited to postop pain bleeding swelling infection recurrence need further surgery overcorrection under correction loss of digit loss of limb loss of life.  She signed all 3 pages a consent form she has a Cam walker at home which she will bring she understands that she will have this pain in her toe for at least 6 weeks.

## 2018-03-29 NOTE — Patient Instructions (Signed)
Pre-Operative Instructions  Congratulations, you have decided to take an important step towards improving your quality of life.  You can be assured that the doctors and staff at [facility] will be with you every step of the way.  Here are some important things you should know:  1. Plan to be at the surgery center/hospital at least 1 (one) hour prior to your scheduled time, unless otherwise directed by the surgical center/hospital staff.  You must have a responsible adult accompany you, remain during the surgery and drive you home.  Make sure you have directions to the surgical center/hospital to ensure you arrive on time. 2. If you are having surgery at [facility] or [facility] hospitals, you will need a copy of your medical history and physical form from your family physician within one month prior to the date of surgery. We will give you a form for your primary physician to complete.  3. We make every effort to accommodate the date you request for surgery.  However, there are times where surgery dates or times have to be moved.  We will contact you as soon as possible if a change in schedule is required.   4. No aspirin/ibuprofen for one week before surgery.  If you are on aspirin, any non-steroidal anti-inflammatory medications (Mobic, Aleve, Ibuprofen) should not be taken seven (7) days prior to your surgery.  You make take Tylenol for pain prior to surgery.  5. Medications - If you are taking daily heart and blood pressure medications, seizure, reflux, allergy, asthma, anxiety, pain or diabetes medications, make sure you notify the surgery center/hospital before the day of surgery so they can tell you which medications you should take or avoid the day of surgery. 6. No food or drink after midnight the night before surgery unless directed otherwise by surgical center/hospital staff. 7. No alcoholic beverages 24-hours prior to surgery.  No smoking 24-hours prior or 24-hours after  surgery. 8. Wear loose pants or shorts. They should be loose enough to fit over bandages, boots, and casts. 9. Don't wear slip-on shoes. Sneakers are preferred. 10. Bring your boot with you to the surgery center/hospital.  Also bring crutches or a walker if your physician has prescribed it for you.  If you do not have this equipment, it will be provided for you after surgery. 11. If you have not been contacted by the surgery center/hospital by the day before your surgery, call to confirm the date and time of your surgery. 12. Leave-time from work may vary depending on the type of surgery you have.  Appropriate arrangements should be made prior to surgery with your employer. 13. Prescriptions will be provided immediately following surgery by your doctor.  Fill these as soon as possible after surgery and take the medication as directed. Pain medications will not be refilled on weekends and must be approved by the doctor. 14. Remove nail polish on the operative foot and avoid getting pedicures prior to surgery. 15. Wash the night before surgery.  The night before surgery wash the foot and leg well with water and the antibacterial soap provided. Be sure to pay special attention to beneath the toenails and in between the toes.  Wash for at least three (3) minutes. Rinse thoroughly with water and dry well with a towel.  Perform this wash unless told not to do so by your physician.  Enclosed: 1 Ice pack (please put in freezer the night before surgery)   1 Hibiclens skin cleaner     Pre-op instructions  If you have any questions regarding the instructions, please do not hesitate to call our office.  [place]: [address], , [place] [address] -- [phone]  [place]: [address] [place], [place] [address] -- [phone]  [place]: [address]  [place], [place] [address] -- [phone]  [place]: [address], [address], [place],  [address] -- [phone]  Website:  [email or web address] 

## 2018-03-30 ENCOUNTER — Telehealth: Payer: Self-pay | Admitting: *Deleted

## 2018-03-30 NOTE — Telephone Encounter (Signed)
"  I'm a patient of Dr. Milinda Pointer.  I just left his office and it's late.  I understand you will get the paperwork tomorrow.  When you do, if you will please call me.  I would like his earliest Friday appointment date and the couple of next Fridays after that if possible.  I'm beginning a new job on the thirtieth and I just need to check with my new supervisor to see what will work with [place] both."

## 2018-03-30 NOTE — Telephone Encounter (Signed)
"  I'm calling to schedule my surgery with Dr. Milinda Pointer.  I called and left you a message yesterday.  Did you receive my paperwork?"  I haven't received it yet.  Dr. Stephenie Acres next available date is not until November 8 or 31.  "Wow that late, go ahead and put me down for November 8.  Hopefully it won't be a problem.  I am starting a new job.  Thank you so much for your help.  I have had two other surgeries and spoken to you.  You have always been pleasant and kind to work with."  Thank you, I appreciate that.

## 2018-03-31 ENCOUNTER — Telehealth: Payer: 59 | Admitting: Family

## 2018-03-31 DIAGNOSIS — J329 Chronic sinusitis, unspecified: Secondary | ICD-10-CM

## 2018-03-31 DIAGNOSIS — B9689 Other specified bacterial agents as the cause of diseases classified elsewhere: Secondary | ICD-10-CM | POA: Diagnosis not present

## 2018-03-31 MED ORDER — DOXYCYCLINE HYCLATE 100 MG PO TABS: 100.0000 mg | ORAL_TABLET | Freq: Two times a day (BID) | ORAL | 0 refills | Status: DC

## 2018-03-31 NOTE — Progress Notes (Signed)
Thank you for the details you included in the comment boxes. Those details are very helpful in determining the best course of treatment for you and help us to provide the best care.  We are sorry that you are not feeling well.  Here is how we plan to help!  Based on what you have shared with me it looks like you have sinusitis.  Sinusitis is inflammation and infection in the sinus cavities of the head.  Based on your presentation I believe you most likely have Acute Bacterial Sinusitis.  This is an infection caused by bacteria and is treated with antibiotics. I have prescribed Doxycycline 100mg by mouth twice a day for 10 days. You may use an oral decongestant such as Mucinex D or if you have glaucoma or high blood pressure use plain Mucinex. Saline nasal spray help and can safely be used as often as needed for congestion.  If you develop worsening sinus pain, fever or notice severe headache and vision changes, or if symptoms are not better after completion of antibiotic, please schedule an appointment with a health care provider.    Sinus infections are not as easily transmitted as other respiratory infection, however we still recommend that you avoid close contact with loved ones, especially the very young and elderly.  Remember to wash your hands thoroughly throughout the day as this is the number one way to prevent the spread of infection!  Home Care:  Only take medications as instructed by your medical team.  Complete the entire course of an antibiotic.  Do not take these medications with alcohol.  A steam or ultrasonic humidifier can help congestion.  You can place a towel over your head and breathe in the steam from hot water coming from a faucet.  Avoid close contacts especially the very young and the elderly.  Cover your mouth when you cough or sneeze.  Always remember to wash your hands.  Get Help Right Away If:  You develop worsening fever or sinus pain.  You develop a severe  head ache or visual changes.  Your symptoms persist after you have completed your treatment plan.  Make sure you  Understand these instructions.  Will watch your condition.  Will get help right away if you are not doing well or get worse.  Your e-visit answers were reviewed by a board certified advanced clinical practitioner to complete your personal care plan.  Depending on the condition, your plan could have included both over the counter or prescription medications.  If there is a problem please reply  once you have received a response from your provider.  Your safety is important to us.  If you have drug allergies check your prescription carefully.    You can use MyChart to ask questions about today's visit, request a non-urgent call back, or ask for a work or school excuse for 24 hours related to this e-Visit. If it has been greater than 24 hours you will need to follow up with your provider, or enter a new e-Visit to address those concerns.  You will get an e-mail in the next two days asking about your experience.  I hope that your e-visit has been valuable and will speed your recovery. Thank you for using e-visits.     

## 2018-04-04 ENCOUNTER — Ambulatory Visit: Payer: 59 | Admitting: Podiatry

## 2018-04-14 DIAGNOSIS — R4189 Other symptoms and signs involving cognitive functions and awareness: Secondary | ICD-10-CM | POA: Diagnosis not present

## 2018-04-21 DIAGNOSIS — F39 Unspecified mood [affective] disorder: Secondary | ICD-10-CM | POA: Diagnosis not present

## 2018-04-21 DIAGNOSIS — F902 Attention-deficit hyperactivity disorder, combined type: Secondary | ICD-10-CM | POA: Diagnosis not present

## 2018-05-04 ENCOUNTER — Other Ambulatory Visit (HOSPITAL_COMMUNITY): Payer: Self-pay | Admitting: Neurology

## 2018-05-04 DIAGNOSIS — R413 Other amnesia: Secondary | ICD-10-CM

## 2018-05-05 DIAGNOSIS — J324 Chronic pansinusitis: Secondary | ICD-10-CM | POA: Diagnosis not present

## 2018-05-05 DIAGNOSIS — J301 Allergic rhinitis due to pollen: Secondary | ICD-10-CM | POA: Diagnosis not present

## 2018-05-05 DIAGNOSIS — H6123 Impacted cerumen, bilateral: Secondary | ICD-10-CM | POA: Diagnosis not present

## 2018-05-05 DIAGNOSIS — J019 Acute sinusitis, unspecified: Secondary | ICD-10-CM | POA: Diagnosis not present

## 2018-05-11 ENCOUNTER — Ambulatory Visit: Payer: 59 | Admitting: Podiatry

## 2018-05-11 ENCOUNTER — Encounter: Payer: Self-pay | Admitting: Podiatry

## 2018-05-11 DIAGNOSIS — M778 Other enthesopathies, not elsewhere classified: Secondary | ICD-10-CM

## 2018-05-11 DIAGNOSIS — M779 Enthesopathy, unspecified: Secondary | ICD-10-CM

## 2018-05-11 NOTE — Progress Notes (Signed)
She presents today for a discussion about her surgical foot right.  This is going to be happening May 19, 2018.  He states that she think she is having a problem with the third metatarsal phalangeal joint as well and would like to consider surgical intervention on this 1 so that we will have to go back and do it as well.  Objective: Vital signs are stable she is alert and oriented x3 definitely has a very prominent third metatarsal phalangeal joint as well I do feel that sliding the second metatarsal back and not doing anything to the third may be not very beneficial for her.  She also has hammertoe deformities to the second and third toes.  Assessment: Plantarflexed second metatarsal and third metatarsals of the right foot.  Hammertoe deformities #2 #3 the right foot.  Plan: Today we re-consented her for a plantarflexed second and third metatarsal osteotomies with screw fixation as well as a hammertoe repair with pins #2 #3 of the right foot.  She understands all of the possible component complications she understands that this is not necessarily going to lengthen her healing.  She also discussed with meeting today in great detail possible postop complications which may include but not limited to postop pain bleeding swelling infection recurrence need further surgery overcorrection under correction.

## 2018-05-12 ENCOUNTER — Ambulatory Visit (HOSPITAL_COMMUNITY): Payer: 59

## 2018-05-12 DIAGNOSIS — F39 Unspecified mood [affective] disorder: Secondary | ICD-10-CM | POA: Diagnosis not present

## 2018-05-12 DIAGNOSIS — F902 Attention-deficit hyperactivity disorder, combined type: Secondary | ICD-10-CM | POA: Diagnosis not present

## 2018-05-12 DIAGNOSIS — J301 Allergic rhinitis due to pollen: Secondary | ICD-10-CM | POA: Diagnosis not present

## 2018-05-12 DIAGNOSIS — Z79899 Other long term (current) drug therapy: Secondary | ICD-10-CM | POA: Diagnosis not present

## 2018-05-13 ENCOUNTER — Ambulatory Visit (HOSPITAL_COMMUNITY)
Admission: RE | Admit: 2018-05-13 | Discharge: 2018-05-13 | Disposition: A | Discharge status: Home / Self Care | Payer: 59 | Source: Ambulatory Visit | Attending: Neurology | Admitting: Neurology

## 2018-05-13 DIAGNOSIS — R413 Other amnesia: Secondary | ICD-10-CM | POA: Diagnosis not present

## 2018-05-13 DIAGNOSIS — R930 Abnormal findings on diagnostic imaging of skull and head, not elsewhere classified: Secondary | ICD-10-CM | POA: Diagnosis not present

## 2018-05-18 ENCOUNTER — Other Ambulatory Visit: Payer: Self-pay | Admitting: Podiatry

## 2018-05-18 MED ORDER — ONDANSETRON HCL 4 MG PO TABS: 4.0000 mg | ORAL_TABLET | Freq: Three times a day (TID) | ORAL | 0 refills | Status: DC | PRN

## 2018-05-18 MED ORDER — HYDROMORPHONE HCL 4 MG PO TABS: 4.0000 mg | ORAL_TABLET | Freq: Four times a day (QID) | ORAL | 0 refills | Status: AC | PRN

## 2018-05-18 MED ORDER — CLINDAMYCIN HCL 150 MG PO CAPS: 150.0000 mg | ORAL_CAPSULE | Freq: Three times a day (TID) | ORAL | 0 refills | Status: DC

## 2018-05-19 ENCOUNTER — Encounter: Payer: Self-pay | Admitting: Podiatry

## 2018-05-19 DIAGNOSIS — M25571 Pain in right ankle and joints of right foot: Secondary | ICD-10-CM | POA: Diagnosis not present

## 2018-05-19 DIAGNOSIS — M7751 Other enthesopathy of right foot: Secondary | ICD-10-CM | POA: Diagnosis not present

## 2018-05-19 DIAGNOSIS — M21541 Acquired clubfoot, right foot: Secondary | ICD-10-CM | POA: Diagnosis not present

## 2018-05-19 DIAGNOSIS — M2041 Other hammer toe(s) (acquired), right foot: Secondary | ICD-10-CM | POA: Diagnosis not present

## 2018-05-19 DIAGNOSIS — M24574 Contracture, right foot: Secondary | ICD-10-CM | POA: Diagnosis not present

## 2018-05-19 DIAGNOSIS — M205X1 Other deformities of toe(s) (acquired), right foot: Secondary | ICD-10-CM | POA: Diagnosis not present

## 2018-05-19 DIAGNOSIS — E039 Hypothyroidism, unspecified: Secondary | ICD-10-CM | POA: Diagnosis not present

## 2018-05-22 ENCOUNTER — Encounter: Payer: Self-pay | Admitting: Podiatry

## 2018-05-23 ENCOUNTER — Telehealth: Payer: Self-pay | Admitting: Podiatry

## 2018-05-23 NOTE — Telephone Encounter (Signed)
Patient called and stated that she dropped toilet paper on her surgery foot. I spoke with Dr. Milinda Pointer and he stated that she needs to stay off foot as much as possible and elevate her foot as well and he will see her for her appointment on 05/24/2018 in [facility]

## 2018-05-24 ENCOUNTER — Ambulatory Visit (INDEPENDENT_AMBULATORY_CARE_PROVIDER_SITE_OTHER): Payer: 59 | Admitting: Podiatry

## 2018-05-24 ENCOUNTER — Encounter: Payer: Self-pay | Admitting: Podiatry

## 2018-05-24 ENCOUNTER — Ambulatory Visit (INDEPENDENT_AMBULATORY_CARE_PROVIDER_SITE_OTHER): Payer: 59

## 2018-05-24 VITALS — BP 101/64 | HR 71 | Temp 98.1°F

## 2018-05-24 DIAGNOSIS — Z9889 Other specified postprocedural states: Secondary | ICD-10-CM

## 2018-05-24 DIAGNOSIS — M2041 Other hammer toe(s) (acquired), right foot: Secondary | ICD-10-CM | POA: Diagnosis not present

## 2018-05-24 DIAGNOSIS — M216X1 Other acquired deformities of right foot: Secondary | ICD-10-CM | POA: Diagnosis not present

## 2018-05-24 NOTE — Progress Notes (Signed)
She presents today for her first postop visit date of surgery May 19, 2018 status post a tarsal osteotomy #2 #3 of the right foot with screw fixation and pinning of the toes.  She states that she is been doing pretty well no fever chills nausea vomiting muscle aches pains however she did drop a large supply of toilet paper rolls on the foot.  Objective: Dry sterile dressing intact once removed demonstrates moderate edema to the foot no erythema cellulitis drainage or odor she does have a mild diastases between the second and third toes at this point more than likely some swelling in between the toes or possibly even a dislodgment of the pin or the capital fragment because of the incident.  Assessment: Postop second metatarsal and third metatarsal osteotomies date of surgery 05/19/2018.  Plan: Discussed etiology pathology and surgical therapies redressed today dressed a compressive dressing follow-up with me in a week for suture removal.

## 2018-05-26 DIAGNOSIS — R4189 Other symptoms and signs involving cognitive functions and awareness: Secondary | ICD-10-CM | POA: Diagnosis not present

## 2018-05-26 DIAGNOSIS — I10 Essential (primary) hypertension: Secondary | ICD-10-CM | POA: Diagnosis not present

## 2018-05-29 ENCOUNTER — Ambulatory Visit (INDEPENDENT_AMBULATORY_CARE_PROVIDER_SITE_OTHER): Payer: Self-pay | Admitting: Podiatry

## 2018-05-29 ENCOUNTER — Encounter: Payer: Self-pay | Admitting: Podiatry

## 2018-05-29 ENCOUNTER — Ambulatory Visit (INDEPENDENT_AMBULATORY_CARE_PROVIDER_SITE_OTHER): Payer: 59

## 2018-05-29 VITALS — BP 102/66 | HR 79 | Temp 98.7°F | Resp 18

## 2018-05-29 DIAGNOSIS — S99921A Unspecified injury of right foot, initial encounter: Secondary | ICD-10-CM

## 2018-05-29 DIAGNOSIS — M216X1 Other acquired deformities of right foot: Secondary | ICD-10-CM | POA: Diagnosis not present

## 2018-05-29 DIAGNOSIS — M79676 Pain in unspecified toe(s): Secondary | ICD-10-CM

## 2018-05-31 ENCOUNTER — Encounter: Payer: Self-pay | Admitting: Podiatry

## 2018-05-31 ENCOUNTER — Ambulatory Visit (INDEPENDENT_AMBULATORY_CARE_PROVIDER_SITE_OTHER): Payer: 59 | Admitting: Podiatry

## 2018-05-31 DIAGNOSIS — M2041 Other hammer toe(s) (acquired), right foot: Secondary | ICD-10-CM

## 2018-05-31 DIAGNOSIS — F39 Unspecified mood [affective] disorder: Secondary | ICD-10-CM | POA: Diagnosis not present

## 2018-05-31 DIAGNOSIS — M216X1 Other acquired deformities of right foot: Secondary | ICD-10-CM

## 2018-05-31 DIAGNOSIS — Z9889 Other specified postprocedural states: Secondary | ICD-10-CM
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
## 2018-06-05 DIAGNOSIS — Z1231 Encounter for screening mammogram for malignant neoplasm of breast: Secondary | ICD-10-CM | POA: Diagnosis not present

## 2018-06-06 ENCOUNTER — Encounter: Payer: Self-pay | Admitting: Family Medicine

## 2018-06-14 ENCOUNTER — Ambulatory Visit (INDEPENDENT_AMBULATORY_CARE_PROVIDER_SITE_OTHER): Payer: 59 | Admitting: Podiatry

## 2018-06-14 ENCOUNTER — Ambulatory Visit (INDEPENDENT_AMBULATORY_CARE_PROVIDER_SITE_OTHER): Payer: 59

## 2018-06-14 ENCOUNTER — Encounter: Payer: Self-pay | Admitting: Podiatry

## 2018-06-14 DIAGNOSIS — M216X1 Other acquired deformities of right foot: Secondary | ICD-10-CM

## 2018-06-14 DIAGNOSIS — Z9889 Other specified postprocedural states: Secondary | ICD-10-CM

## 2018-06-14 DIAGNOSIS — M2041 Other hammer toe(s) (acquired), right foot: Secondary | ICD-10-CM

## 2018-06-14 NOTE — Progress Notes (Signed)
Date of surgery today 1108 2019 seconds third metatarsal osteotomy hammertoe repair #2 #3 doing better started hurting in the second toe she refers to her right foot.  Objective: Vital signs are stable alert and oriented x3.  There is mild edema much less edema than previously noted.  K wires are still present the second third toe it appears that they have twisted the heads of the osteotomy sites just a little bit more lateral than I had placed them.  Her graph assessment: Well-healing surgical foot.  Plan: We will follow-up with her in a couple weeks at which time we will try to remove the K wires.

## 2018-06-19 ENCOUNTER — Encounter: Payer: 59 | Admitting: Podiatry

## 2018-06-21 ENCOUNTER — Encounter: Payer: 59 | Admitting: Podiatry

## 2018-06-22 DIAGNOSIS — F902 Attention-deficit hyperactivity disorder, combined type: Secondary | ICD-10-CM | POA: Diagnosis not present

## 2018-06-22 DIAGNOSIS — F39 Unspecified mood [affective] disorder: Secondary | ICD-10-CM | POA: Diagnosis not present

## 2018-06-28 ENCOUNTER — Encounter: Payer: Self-pay | Admitting: Podiatry

## 2018-06-28 ENCOUNTER — Ambulatory Visit (INDEPENDENT_AMBULATORY_CARE_PROVIDER_SITE_OTHER): Payer: 59 | Admitting: Podiatry

## 2018-06-28 ENCOUNTER — Ambulatory Visit (INDEPENDENT_AMBULATORY_CARE_PROVIDER_SITE_OTHER): Payer: 59

## 2018-06-28 DIAGNOSIS — M216X1 Other acquired deformities of right foot: Secondary | ICD-10-CM

## 2018-06-28 DIAGNOSIS — M2041 Other hammer toe(s) (acquired), right foot: Secondary | ICD-10-CM

## 2018-06-28 DIAGNOSIS — Z9889 Other specified postprocedural states: Secondary | ICD-10-CM

## 2018-06-29 NOTE — Progress Notes (Signed)
She presents today for postop visit status post second metatarsal osteotomy hammertoe repair #2 #3 of the right foot.  She denies fever chills nausea vomiting muscle aches pain states that they have been hurting.  Objective: Vital signs are stable she is alert oriented x3 K wires to the second and third metatarsophalangeal joints are intact radiographs demonstrate well-healed osteotomies.  Time to remove the K wires.  Assessment: Well-healing surgical foot.  Plan: After local anesthetic was injected I remove the K wires toes remained rectus discussed appropriate shoe gear and I will follow-up with her in a couple of weeks.

## 2018-06-30 ENCOUNTER — Encounter: Payer: Self-pay | Admitting: Family Medicine

## 2018-06-30 ENCOUNTER — Ambulatory Visit: Payer: Self-pay | Admitting: Nurse Practitioner

## 2018-06-30 ENCOUNTER — Ambulatory Visit: Payer: 59 | Admitting: Family Medicine

## 2018-06-30 VITALS — BP 120/70 | HR 78 | Temp 97.8°F | Ht 62.0 in | Wt 172.0 lb

## 2018-06-30 DIAGNOSIS — J0100 Acute maxillary sinusitis, unspecified: Secondary | ICD-10-CM | POA: Diagnosis not present

## 2018-06-30 MED ORDER — HYDROCODONE-HOMATROPINE 5-1.5 MG/5ML PO SYRP: 5.0000 mL | ORAL_SOLUTION | Freq: Three times a day (TID) | ORAL | 0 refills | Status: DC | PRN

## 2018-06-30 MED ORDER — DOXYCYCLINE HYCLATE 100 MG PO TABS: 100.0000 mg | ORAL_TABLET | Freq: Two times a day (BID) | ORAL | 0 refills | Status: DC

## 2018-06-30 NOTE — Progress Notes (Signed)
Established Patient Office Visit  Subjective:  Patient ID: Mandy Torres, female    DOB: Feb 03, 1971  Age: 47 y.o. MRN: [id number]  CC:  Chief Complaint  Patient presents with  . sinus drainage  . Cough  . congestion    HPI Mandy Torres presents for evaluation of a 2 week history of sinus drainage, cough, and congestion. She has not had a fever that she is aware of. She has been taking OTC medications that include Mucinex, Cough DM, and Sudafed. She has not been getting any relief with these medicines. She has green/yellow mucus that is now draining down her throat.  She is now feeling facial pressure in the left maxillary sinus.  She is having no teeth pain in this area.  There is copious yellow-green rhinorrhea with postnasal drip.  Cough is productive of the same.  She is running no fevers or chills and has not been wheezing.  Cough is predominantly at night.  She has a history of nausea with oxycodone but has taken hydrocodone in the past without incident.  She is taken doxycycline in the past for sinus infections and tolerates it.  Past Medical History:  Diagnosis Date  . ADHD (attention deficit hyperactivity disorder)    [name]  . Allergy   . Anemia   . BRCA negative 05/27/2016   variant of uncertain significance found in BRCA2  . Complication of anesthesia   . Depression   . DJD (degenerative joint disease), cervical    neurosurg - Dr. Wendall Stade at [facility]  . Foot neuroma   . GERD (gastroesophageal reflux disease)   . Hiatal hernia   . History of breast biopsy 05/2001  . Hx MRSA infection    X 2-LAST HAD IN 2015  . Hypertension   . Hypothyroidism   . Migraine   . PONV (postoperative nausea and vomiting)   . Thyroid disease 2017  . TMJ disease     Past Surgical History:  Procedure Laterality Date  . [place] STUDY N/A 12/31/2015   Procedure: [place] STUDY;  Surgeon: Lucilla Lame, MD;  Location: [facility] ENDOSCOPY;  Service: Endoscopy;  Laterality: N/A;    . APPENDECTOMY    . BREAST SURGERY     normal biopsy 2002, left  . CHOLECYSTECTOMY    . ESOPHAGEAL MANOMETRY N/A 12/17/2015   Procedure: ESOPHAGEAL MANOMETRY (EM);  Surgeon: Lucilla Lame, MD;  Location: [facility] ENDOSCOPY;  Service: Endoscopy;  Laterality: N/A;  . FACIAL RECONSTRUCTION SURGERY  2/88, 9/88, 2/89  . HIATAL HERNIA REPAIR  02/03/2016   Arletta Bale, M.D.  . KNEE SURGERY  11/97 and 4/93   right  . METATARSAL OSTEOTOMY WITH BUNIONECTOMY  2016  . NASAL SINUS SURGERY  2006 and 1998   deviated septum, ethmoids, Dr. Caryn Section then Richardson Landry  . OOPHORECTOMY Right   . Right and Left Foot Surgery  06/2016    Family History  Problem Relation Age of Onset  . Cancer Mother 93       Breast  . Diabetes Mother   . Thyroid disease Mother   . Cancer Sister 70       Half sister, Breast  . Cancer Maternal Aunt        Breast  . Diabetes Maternal Aunt   . Thyroid disease Maternal Aunt   . Heart disease Maternal Grandmother   . Pancreatic cancer Maternal Grandfather     Social History   Socioeconomic History  . Marital status: Married  Spouse name: Not on file  . Number of children: Not on file  . Years of education: Not on file  . Highest education level: Not on file  Occupational History  . Not on file  Social Needs  . Financial resource strain: Not on file  . Food insecurity:    Worry: Not on file    Inability: Not on file  . Transportation needs:    Medical: Not on file    Non-medical: Not on file  Tobacco Use  . Smoking status: Never Smoker  . Smokeless tobacco: Never Used  Substance and Sexual Activity  . Alcohol use: No  . Drug use: No  . Sexual activity: Yes    Birth control/protection: Pill  Lifestyle  . Physical activity:    Days per week: Not on file    Minutes per session: Not on file  . Stress: Not on file  Relationships  . Social connections:    Talks on phone: Not on file    Gets together: Not on file    Attends religious service: Not on file     Active member of club or organization: Not on file    Attends meetings of clubs or organizations: Not on file    Relationship status: Not on file  . Intimate partner violence:    Fear of current or ex partner: Not on file    Emotionally abused: Not on file    Physically abused: Not on file    Forced sexual activity: Not on file  Other Topics Concern  . Not on file  Social History Narrative   Single   0 children   0 pregnancies    First menstrual cycle: 47 yrs old          Outpatient Medications Prior to Visit  Medication Sig Dispense Refill  . ARIPiprazole (ABILIFY) 5 MG tablet   0  . azelastine (ASTELIN) 0.1 % nasal spray   12  . Biotin 1000 MCG tablet Take by mouth.    . busPIRone (BUSPAR) 10 MG tablet   0  . cetirizine (ZYRTEC) 10 MG tablet Take by mouth.    . chlorhexidine (PERIDEX) 0.12 % solution   0  . citalopram (CELEXA) 20 MG tablet Take by mouth.    . CONCERTA 18 MG CR tablet   0  . Desoximetasone (TOPICORT) 0.25 % LIQD Topicort 0.25 % topical spray    . divalproex (DEPAKOTE) 500 MG DR tablet   1  . docusate sodium (COLACE) 100 MG capsule Take by mouth.    . hydrochlorothiazide (MICROZIDE) 12.5 MG capsule Take 1 capsule (12.5 mg total) by mouth daily. 90 capsule 3  . HYDROmorphone (DILAUDID) 4 MG tablet hydromorphone 4 mg tablet    . hydrOXYzine (ATARAX/VISTARIL) 25 MG tablet Take 1 tablet (25 mg total) by mouth 3 (three) times daily as needed. 30 tablet 0  . ibuprofen (ADVIL,MOTRIN) 600 MG tablet Take 600 mg by mouth once.  0  . ketoconazole (NIZORAL) 2 % shampoo   6  . Lactobacillus Rhamnosus, GG, (CULTURELLE) CAPS Take by mouth.    . levocetirizine (XYZAL) 5 MG tablet Take 1 tablet (5 mg total) by mouth every evening. 30 tablet 5  . levothyroxine (SYNTHROID, LEVOTHROID) 25 MCG tablet Take by mouth.    . Loratadine 10 MG CAPS Take by mouth.    . lurasidone (LATUDA) 40 MG TABS tablet Latuda 40 mg tablet    . memantine (NAMENDA) 5 MG tablet   3  .  methocarbamol  (ROBAXIN) 750 MG tablet methocarbamol 750 mg tablet    . norethindrone-ethinyl estradiol (MICROGESTIN,JUNEL,LOESTRIN) 1-20 MG-MCG tablet TAKE 1 TABLET BY MOUTH DAILY. 63 tablet 6  . ondansetron (ZOFRAN) 4 MG tablet Take 1 tablet (4 mg total) by mouth every 8 (eight) hours as needed for nausea or vomiting. 20 tablet 0  . ondansetron (ZOFRAN-ODT) 4 MG disintegrating tablet   3  . potassium chloride SA (K-DUR,KLOR-CON) 20 MEQ tablet Take 2 tablets (40 mEq total) by mouth daily. 6 tablet 0  . Probiotic Product (TRUBIOTICS PO) Take by mouth.    . pseudoephedrine (SUDAFED) 60 MG tablet Take 60 mg by mouth every 4 (four) hours as needed for congestion.    . rizatriptan (MAXALT-MLT) 10 MG disintegrating tablet 10 mg    . saccharomyces boulardii (FLORASTOR) 250 MG capsule Take by mouth.    . traZODone (DESYREL) 50 MG tablet   1  . triamcinolone (NASACORT) 55 MCG/ACT AERO nasal inhaler Place into the nose.    . Vitamin D, Cholecalciferol, 400 units CAPS Take 2 capsules by mouth daily. 180 capsule 2  . ziprasidone (GEODON) 60 MG capsule     . clindamycin (CLEOCIN) 150 MG capsule Take 1 capsule (150 mg total) by mouth 3 (three) times daily. 30 capsule 0  . clindamycin (CLEOCIN) 300 MG capsule Take 1 capsule (300 mg total) by mouth 3 (three) times daily. 30 capsule 0  . doxycycline (VIBRA-TABS) 100 MG tablet Take 1 tablet (100 mg total) by mouth 2 (two) times daily. 20 tablet 0  . fluconazole (DIFLUCAN) 150 MG tablet   0   No facility-administered medications prior to visit.     Allergies  Allergen Reactions  . Diazepam Other (See Comments)    Other Reaction: Other reaction Hyperactivity  Hyperactivity  Other Reaction: Other reaction  . Oxycodone-Acetaminophen Nausea Only and Nausea And Vomiting    Also Endocet Also Endocet  . Penicillins Hives and Nausea Only  . Amphetamine-Dextroamphetamine Other (See Comments)    Hyperactivity, "extra happy"  . Avelox [Moxifloxacin Hcl In Nacl] Hives  .  Cheese     FETA CHEESE ONLY-MIGRAINES  . Endocet [Oxycodone-Acetaminophen] Nausea Only  . Epitol [Carbamazepine] Hives    Severe hives and swelling  . Levofloxacin Nausea And Vomiting  . Levofloxacin Nausea Only  . Moxifloxacin Hives  . Niacin And Related     ONLY IF LARGE AMOUNTS IN FOOD ONLY-MIGRAINES  . Percocet [Oxycodone-Acetaminophen] Nausea And Vomiting    Also Endocet  . Ranitidine Nausea And Vomiting  . Sulfa Antibiotics Hives  . Topamax     congnitive impairmant  . Topiramate Other (See Comments)    Cognitive impairment   . Valium     Hyperactivity     ROS Review of Systems  Constitutional: Negative for chills, diaphoresis, fatigue, fever and unexpected weight change.  HENT: Positive for congestion, postnasal drip, rhinorrhea, sinus pressure and sinus pain. Negative for ear pain and hearing loss.   Eyes: Negative for photophobia and visual disturbance.  Respiratory: Positive for cough. Negative for shortness of breath and wheezing.   Cardiovascular: Negative.   Gastrointestinal: Negative.   Genitourinary: Negative.   Musculoskeletal: Negative for arthralgias and myalgias.  Skin: Negative for pallor and rash.  Allergic/Immunologic: Negative for immunocompromised state.  Neurological: Negative for light-headedness and headaches.  Hematological: Does not bruise/bleed easily.  Psychiatric/Behavioral: Negative.       Objective:    Physical Exam  Constitutional: She is oriented to person, place, and time. She  appears well-developed and well-nourished. No distress.  HENT:  Head: Normocephalic and atraumatic.  Right Ear: External ear normal.  Left Ear: External ear normal.  Mouth/Throat: Oropharynx is clear and moist. No oropharyngeal exudate.  Eyes: Pupils are equal, round, and reactive to light. Conjunctivae and EOM are normal. Right eye exhibits no discharge. Left eye exhibits no discharge. No scleral icterus.  Neck: Neck supple. No JVD present. No tracheal  deviation present. No thyromegaly present.  Cardiovascular: Normal rate, regular rhythm and normal heart sounds.  Pulmonary/Chest: Effort normal and breath sounds normal. No stridor.  Abdominal: Bowel sounds are normal.  Lymphadenopathy:    She has no cervical adenopathy.  Neurological: She is alert and oriented to person, place, and time.  Skin: Skin is warm and dry. She is not diaphoretic.  Psychiatric: She has a normal mood and affect. Her behavior is normal.    BP 120/70   Pulse 78   Temp 97.8 F (36.6 C) (Oral)   Ht 5' 2"  (1.575 m)   Wt 172 lb (78 kg)   SpO2 98%   BMI 31.46 kg/m  Wt Readings from Last 3 Encounters:  06/30/18 172 lb (78 kg)  01/17/18 165 lb (74.8 kg)  11/12/16 162 lb 6.4 oz (73.7 kg)   BP Readings from Last 3 Encounters:  06/30/18 120/70  01/17/18 131/80  11/12/16 136/77   Guideline developer:  UpToDate (see UpToDate for funding source) Date Released: June 2014  Health Maintenance Due  Topic Date Due  . HIV Screening  03/03/1984  . TETANUS/TDAP  03/03/1988  . INFLUENZA VACCINE  02/09/2018    There are no preventive care reminders to display for this patient.  Lab Results  Component Value Date   TSH 1.07 01/25/2018   Lab Results  Component Value Date   WBC 5.7 01/25/2018   HGB 13.6 01/25/2018   HCT 39.2 01/25/2018   MCV 95.6 01/25/2018   PLT 320.0 01/25/2018   Lab Results  Component Value Date   NA 131 (L) 02/01/2018   K 4.6 02/01/2018   CO2 29 02/01/2018   GLUCOSE 84 02/01/2018   BUN 10 02/01/2018   CREATININE 0.72 02/01/2018   BILITOT 0.3 01/25/2018   ALKPHOS 39 01/25/2018   AST 16 01/25/2018   ALT 19 01/25/2018   PROT 6.0 01/25/2018   ALBUMIN 3.8 01/25/2018   CALCIUM 8.4 02/01/2018   GFR 91.54 02/01/2018   Lab Results  Component Value Date   CHOL 172 11/12/2016   Lab Results  Component Value Date   HDL 47 11/12/2016   Lab Results  Component Value Date   LDLCALC 103 (H) 11/12/2016   Lab Results  Component Value  Date   TRIG 112 11/12/2016   Lab Results  Component Value Date   CHOLHDL 3.7 11/12/2016   Lab Results  Component Value Date   HGBA1C 5.8 (H) 01/25/2014      Assessment & Plan:   Problem List Items Addressed This Visit    None    Visit Diagnoses    Acute non-recurrent maxillary sinusitis    -  Primary   Relevant Medications   doxycycline (VIBRA-TABS) 100 MG tablet   HYDROcodone-homatropine (HYCODAN) 5-1.5 MG/5ML syrup      Meds ordered this encounter  Medications  . doxycycline (VIBRA-TABS) 100 MG tablet    Sig: Take 1 tablet (100 mg total) by mouth 2 (two) times daily.    Dispense:  20 tablet    Refill:  0  . HYDROcodone-homatropine (HYCODAN)  5-1.5 MG/5ML syrup    Sig: Take 5 mLs by mouth every 8 (eight) hours as needed for cough.    Dispense:  120 mL    Refill:  0    Follow-up: Return if symptoms worsen or fail to improve.   Reminded patient of sun sensitive precautions with the doxycycline.  She is to finish it.  Follow-up if her symptoms do not improve

## 2018-07-03 ENCOUNTER — Encounter: Payer: 59 | Admitting: Podiatry

## 2018-07-19 DIAGNOSIS — F4322 Adjustment disorder with anxiety: Secondary | ICD-10-CM | POA: Diagnosis not present

## 2018-07-24 ENCOUNTER — Other Ambulatory Visit: Payer: Self-pay | Admitting: Family Medicine

## 2018-07-31 ENCOUNTER — Encounter: Payer: Self-pay | Admitting: Podiatry

## 2018-07-31 ENCOUNTER — Ambulatory Visit (INDEPENDENT_AMBULATORY_CARE_PROVIDER_SITE_OTHER): Payer: 59

## 2018-07-31 ENCOUNTER — Ambulatory Visit (INDEPENDENT_AMBULATORY_CARE_PROVIDER_SITE_OTHER): Payer: 59 | Admitting: Podiatry

## 2018-07-31 DIAGNOSIS — M216X1 Other acquired deformities of right foot: Secondary | ICD-10-CM

## 2018-07-31 DIAGNOSIS — M2041 Other hammer toe(s) (acquired), right foot: Secondary | ICD-10-CM | POA: Diagnosis not present

## 2018-07-31 DIAGNOSIS — Z9889 Other specified postprocedural states: Secondary | ICD-10-CM

## 2018-07-31 NOTE — Progress Notes (Signed)
She presents today for postop visit date of surgery 05/19/2018 states that she is about 80% better than she was previously states that she has not started walking for exercise but will begin in the near future.  Objective: Vital signs are stable alert oriented x3.  Pulses are palpable.  She has good range of motion around the second metatarsal phalangeal joint.  Radiographs demonstrate what appears to be some contraction of the joint and possible overlapping of the joint but it does not feel that way on physical exam.  So at this point will continue on.  Assessment: Well-healing surgical foot.  Plan: Increase activity level.  Follow-up with her in 1 month

## 2018-08-08 DIAGNOSIS — F4322 Adjustment disorder with anxiety: Secondary | ICD-10-CM | POA: Diagnosis not present

## 2018-08-09 DIAGNOSIS — F39 Unspecified mood [affective] disorder: Secondary | ICD-10-CM | POA: Diagnosis not present

## 2018-08-30 ENCOUNTER — Encounter: Payer: 59 | Admitting: Podiatry

## 2018-09-01 DIAGNOSIS — F902 Attention-deficit hyperactivity disorder, combined type: Secondary | ICD-10-CM | POA: Diagnosis not present

## 2018-09-01 DIAGNOSIS — F39 Unspecified mood [affective] disorder: Secondary | ICD-10-CM | POA: Diagnosis not present

## 2018-09-11 ENCOUNTER — Encounter: Payer: Self-pay | Admitting: Podiatry

## 2018-09-11 ENCOUNTER — Ambulatory Visit (INDEPENDENT_AMBULATORY_CARE_PROVIDER_SITE_OTHER): Payer: 59

## 2018-09-11 ENCOUNTER — Other Ambulatory Visit: Payer: Self-pay | Admitting: Podiatry

## 2018-09-11 ENCOUNTER — Ambulatory Visit: Payer: 59 | Admitting: Podiatry

## 2018-09-11 DIAGNOSIS — S90121A Contusion of right lesser toe(s) without damage to nail, initial encounter: Secondary | ICD-10-CM | POA: Diagnosis not present

## 2018-09-11 DIAGNOSIS — S99921A Unspecified injury of right foot, initial encounter: Secondary | ICD-10-CM

## 2018-09-11 DIAGNOSIS — S92501A Displaced unspecified fracture of right lesser toe(s), initial encounter for closed fracture: Secondary | ICD-10-CM | POA: Diagnosis not present

## 2018-09-11 NOTE — Progress Notes (Signed)
She presents today after hitting her toe for the second time in a week as she refers to the fourth digit of the right foot.  States that he had on the bathtub twice and I think it is broken.  Objective: Vital signs are stable she is alert and oriented x3.  Pulses are palpable.  This is her surgical foot but has good range of motion of toes #2 and 3 of the right foot without pain.  The fourth toe of the right foot demonstrates swelling and ecchymosis.  Radiograph does demonstrate a transverse fracture of the intermediate phalanx nondisplaced non-comminuted.  Assessment: Fracture fourth toe right.  Plan: Showed her how to tape the toe today we will follow-up with her in 4 to 6 weeks if necessary.

## 2018-09-25 ENCOUNTER — Encounter: Payer: 59 | Admitting: Podiatry

## 2018-10-04 DIAGNOSIS — L2089 Other atopic dermatitis: Secondary | ICD-10-CM | POA: Diagnosis not present

## 2018-10-09 ENCOUNTER — Encounter: Payer: 59 | Admitting: Podiatry

## 2018-11-24 DIAGNOSIS — F902 Attention-deficit hyperactivity disorder, combined type: Secondary | ICD-10-CM | POA: Diagnosis not present

## 2018-11-24 DIAGNOSIS — F39 Unspecified mood [affective] disorder: Secondary | ICD-10-CM | POA: Diagnosis not present

## 2018-11-27 DIAGNOSIS — S76012A Strain of muscle, fascia and tendon of left hip, initial encounter: Secondary | ICD-10-CM | POA: Diagnosis not present

## 2018-11-27 DIAGNOSIS — S76011A Strain of muscle, fascia and tendon of right hip, initial encounter: Secondary | ICD-10-CM | POA: Diagnosis not present

## 2018-11-27 DIAGNOSIS — M25552 Pain in left hip: Secondary | ICD-10-CM | POA: Diagnosis not present

## 2018-11-29 DIAGNOSIS — M25561 Pain in right knee: Secondary | ICD-10-CM | POA: Diagnosis not present

## 2018-11-29 DIAGNOSIS — M170 Bilateral primary osteoarthritis of knee: Secondary | ICD-10-CM | POA: Diagnosis not present

## 2018-11-29 DIAGNOSIS — M25562 Pain in left knee: Secondary | ICD-10-CM | POA: Diagnosis not present

## 2018-11-29 DIAGNOSIS — M1711 Unilateral primary osteoarthritis, right knee: Secondary | ICD-10-CM | POA: Diagnosis not present

## 2018-11-29 DIAGNOSIS — M1712 Unilateral primary osteoarthritis, left knee: Secondary | ICD-10-CM | POA: Diagnosis not present

## 2018-12-06 DIAGNOSIS — M25552 Pain in left hip: Secondary | ICD-10-CM | POA: Diagnosis not present

## 2019-03-20 ENCOUNTER — Other Ambulatory Visit: Payer: Self-pay | Admitting: Family Medicine

## 2019-03-20 DIAGNOSIS — Z3041 Encounter for surveillance of contraceptive pills: Secondary | ICD-10-CM

## 2019-03-27 DIAGNOSIS — F39 Unspecified mood [affective] disorder: Secondary | ICD-10-CM | POA: Diagnosis not present

## 2019-03-30 DIAGNOSIS — M67432 Ganglion, left wrist: Secondary | ICD-10-CM | POA: Diagnosis not present

## 2019-03-30 DIAGNOSIS — M25532 Pain in left wrist: Secondary | ICD-10-CM | POA: Diagnosis not present

## 2019-04-11 DIAGNOSIS — F39 Unspecified mood [affective] disorder: Secondary | ICD-10-CM | POA: Diagnosis not present

## 2019-04-20 DIAGNOSIS — F902 Attention-deficit hyperactivity disorder, combined type: Secondary | ICD-10-CM | POA: Diagnosis not present

## 2019-04-20 DIAGNOSIS — F39 Unspecified mood [affective] disorder: Secondary | ICD-10-CM | POA: Diagnosis not present

## 2019-04-25 ENCOUNTER — Other Ambulatory Visit: Payer: Self-pay

## 2019-04-25 ENCOUNTER — Ambulatory Visit (INDEPENDENT_AMBULATORY_CARE_PROVIDER_SITE_OTHER): Payer: 59 | Admitting: Advanced Practice Midwife

## 2019-04-25 ENCOUNTER — Encounter: Payer: Self-pay | Admitting: Advanced Practice Midwife

## 2019-04-25 VITALS — BP 115/84 | HR 74 | Wt 186.0 lb

## 2019-04-25 DIAGNOSIS — Z1151 Encounter for screening for human papillomavirus (HPV): Secondary | ICD-10-CM

## 2019-04-25 DIAGNOSIS — N898 Other specified noninflammatory disorders of vagina: Secondary | ICD-10-CM

## 2019-04-25 DIAGNOSIS — Z124 Encounter for screening for malignant neoplasm of cervix: Secondary | ICD-10-CM | POA: Diagnosis not present

## 2019-04-25 DIAGNOSIS — Z01419 Encounter for gynecological examination (general) (routine) without abnormal findings: Secondary | ICD-10-CM | POA: Diagnosis not present

## 2019-04-25 DIAGNOSIS — N951 Menopausal and female climacteric states: Secondary | ICD-10-CM

## 2019-04-25 MED ORDER — FLUCONAZOLE 150 MG PO TABS: 150.0000 mg | ORAL_TABLET | Freq: Once | ORAL | 0 refills | Status: AC

## 2019-04-25 NOTE — Progress Notes (Signed)
GYNECOLOGY ANNUAL PREVENTATIVE CARE ENCOUNTER NOTE  History:     Mandy Torres is a 48 y.o. G0P0000 female here for a routine annual gynecologic exam.  Current complaints:no.   Denies abnormal vaginal bleeding, discharge, pelvic pain, problems with intercourse or other gynecologic concerns.    Patient is a non-smoker. Lives with her husband and they have a new rescue pup named "Tulie". They are sexually active, monogamous x several years. She denies concerns for SI, HI, IPV.   Gynecologic History No LMP recorded (lmp unknown). (Menstrual status: Oral contraceptives). Contraception: OCP (estrogen/progesterone) Last Pap: 01/2018. Results were:normal with negative HPV Last mammogram: 05/2018. Results were: normal  Obstetric History OB History  Gravida Para Term Preterm AB Living  0 0 0 0 0 0  SAB TAB Ectopic Multiple Live Births  0 0 0 0 0    Past Medical History:  Diagnosis Date  . ADHD (attention deficit hyperactivity disorder)    Mandy Torres  . Allergy   . Anemia   . BRCA negative 05/27/2016   variant of uncertain significance found in BRCA2  . Complication of anesthesia   . Depression   . DJD (degenerative joint disease), cervical    neurosurg - Dr. Wendall Stade at [facility]  . Foot neuroma   . GERD (gastroesophageal reflux disease)   . Hiatal hernia   . History of breast biopsy 05/2001  . Hx MRSA infection    X 2-LAST HAD IN 2015  . Hypertension   . Hypothyroidism   . Migraine   . PONV (postoperative nausea and vomiting)   . Thyroid disease 2017  . TMJ disease     Past Surgical History:  Procedure Laterality Date  . [place] STUDY N/A 12/31/2015   Procedure: [place] STUDY;  Surgeon: Lucilla Lame, MD;  Location: [facility] ENDOSCOPY;  Service: Endoscopy;  Laterality: N/A;  . APPENDECTOMY    . BREAST SURGERY     normal biopsy 2002, left  . CHOLECYSTECTOMY    . ESOPHAGEAL MANOMETRY N/A 12/17/2015   Procedure: ESOPHAGEAL MANOMETRY (EM);  Surgeon: Lucilla Lame, MD;   Location: [facility] ENDOSCOPY;  Service: Endoscopy;  Laterality: N/A;  . FACIAL RECONSTRUCTION SURGERY  2/88, 9/88, 2/89  . HIATAL HERNIA REPAIR  02/03/2016   Arletta Bale, M.D.  . KNEE SURGERY  11/97 and 4/93   right  . METATARSAL OSTEOTOMY WITH BUNIONECTOMY  2016  . NASAL SINUS SURGERY  2006 and 1998   deviated septum, ethmoids, Dr. Caryn Section then Richardson Landry  . OOPHORECTOMY Right   . Right and Left Foot Surgery  06/2016    Current Outpatient Medications on File Prior to Visit  Medication Sig Dispense Refill  . ARIPiprazole (ABILIFY) 5 MG tablet   0  . azelastine (ASTELIN) 0.1 % nasal spray   12  . Biotin 1000 MCG tablet Take by mouth.    . busPIRone (BUSPAR) 10 MG tablet   0  . cetirizine (ZYRTEC) 10 MG tablet Take by mouth.    . clindamycin (CLEOCIN) 300 MG capsule clindamycin HCl 300 mg capsule    . clobetasol (TEMOVATE) 0.05 % external solution clobetasol 0.05 % scalp solution    . CONCERTA 18 MG CR tablet   0  . Desoximetasone (TOPICORT) 0.25 % LIQD Topicort 0.25 % topical spray    . divalproex (DEPAKOTE) 500 MG DR tablet   1  . docusate sodium (COLACE) 100 MG capsule Take by mouth.    . hydrOXYzine (VISTARIL) 25 MG capsule     .  ketoconazole (NIZORAL) 2 % shampoo   6  . Lactobacillus Rhamnosus, GG, (CULTURELLE) CAPS Take by mouth.    . levothyroxine (SYNTHROID, LEVOTHROID) 25 MCG tablet Take by mouth.    . Loratadine 10 MG CAPS Take by mouth.    . memantine (NAMENDA) 5 MG tablet   3  . norethindrone-ethinyl estradiol (LOESTRIN) 1-20 MG-MCG tablet TAKE 1 TABLET BY MOUTH DAILY. 63 tablet 6  . Probiotic Product (TRUBIOTICS PO) Take by mouth.    . pseudoephedrine (SUDAFED) 60 MG tablet Take 60 mg by mouth every 4 (four) hours as needed for congestion.    . rizatriptan (MAXALT) 10 MG tablet TAKE 1 TABLET BY MOUTH AS NEEDED. REPEAT IN 2 HOURS IF NEEDED. 9 tablet 5  . rizatriptan (MAXALT-MLT) 10 MG disintegrating tablet 10 mg    . saccharomyces boulardii (FLORASTOR) 250 MG capsule Take by  mouth.    . traZODone (DESYREL) 50 MG tablet   1  . triamcinolone (NASACORT) 55 MCG/ACT AERO nasal inhaler Place into the nose.    . Vitamin D, Cholecalciferol, 400 units CAPS Take 2 capsules by mouth daily. 180 capsule 2  . chlorhexidine (PERIDEX) 0.12 % solution   0  . hydrochlorothiazide (MICROZIDE) 12.5 MG capsule Take 1 capsule (12.5 mg total) by mouth daily. (Patient not taking: Reported on 04/25/2019) 90 capsule 3  . hydrOXYzine (ATARAX/VISTARIL) 25 MG tablet Take 1 tablet (25 mg total) by mouth 3 (three) times daily as needed. (Patient not taking: Reported on 04/25/2019) 30 tablet 0  . ibuprofen (ADVIL,MOTRIN) 600 MG tablet Take 600 mg by mouth once.  0  . levocetirizine (XYZAL) 5 MG tablet Take 1 tablet (5 mg total) by mouth every evening. (Patient not taking: Reported on 04/25/2019) 30 tablet 5  . lurasidone (LATUDA) 40 MG TABS tablet Latuda 40 mg tablet    . methocarbamol (ROBAXIN) 750 MG tablet methocarbamol 750 mg tablet     No current facility-administered medications on file prior to visit.     Allergies  Allergen Reactions  . Diazepam Other (See Comments)    Other Reaction: Other reaction Hyperactivity  Hyperactivity  Other Reaction: Other reaction  . Oxycodone-Acetaminophen Nausea Only and Nausea And Vomiting    Also Endocet Also Endocet  . Penicillins Hives and Nausea Only  . Amphetamine-Dextroamphetamine Other (See Comments)    Hyperactivity, "extra happy"  . Avelox [Moxifloxacin Hcl In Nacl] Hives  . Cheese     FETA CHEESE ONLY-MIGRAINES  . Endocet [Oxycodone-Acetaminophen] Nausea Only  . Epitol [Carbamazepine] Hives    Severe hives and swelling  . Levofloxacin Nausea And Vomiting  . Levofloxacin Nausea Only  . Moxifloxacin Hives  . Niacin And Related     ONLY IF LARGE AMOUNTS IN FOOD ONLY-MIGRAINES  . Percocet [Oxycodone-Acetaminophen] Nausea And Vomiting    Also Endocet  . Ranitidine Nausea And Vomiting  . Sulfa Antibiotics Hives  . Topamax      congnitive impairmant  . Topiramate Other (See Comments)    Cognitive impairment   . Valium     Hyperactivity     Social History:  reports that she has never smoked. She has never used smokeless tobacco. She reports that she does not drink alcohol or use drugs.  Family History  Problem Relation Age of Onset  . Cancer Mother 70       Breast  . Diabetes Mother   . Thyroid disease Mother   . Cancer Sister 79       Half sister, Breast  .  Cancer Maternal Aunt        Breast  . Diabetes Maternal Aunt   . Thyroid disease Maternal Aunt   . Heart disease Maternal Grandmother   . Pancreatic cancer Maternal Grandfather     The following portions of the patient's history were reviewed and updated as appropriate: allergies, current medications, past family history, past medical history, past social history, past surgical history and problem list.  Review of Systems Pertinent items noted in HPI and remainder of comprehensive ROS otherwise negative.  Physical Exam:  BP 115/84   Pulse 74   Wt 186 lb (84.4 kg)   LMP  (LMP Unknown)   BMI 34.02 kg/m  CONSTITUTIONAL: Well-developed, well-nourished female in no acute distress.  HENT:  Normocephalic, atraumatic, External right and left ear normal. Oropharynx is clear and moist EYES: Conjunctivae and EOM are normal. Pupils are equal, round, and reactive to light. No scleral icterus.  NECK: Normal range of motion, supple, no masses.  Normal thyroid.  SKIN: Skin is warm and dry. No rash noted. Not diaphoretic. No erythema. No pallor. MUSCULOSKELETAL: Normal range of motion. No tenderness.  No cyanosis, clubbing, or edema.  2+ distal pulses. NEUROLOGIC: Alert and oriented to person, place, and time. Normal reflexes, muscle tone coordination. No cranial nerve deficit noted. PSYCHIATRIC: Normal mood and affect. Normal behavior. Normal judgment and thought content. CARDIOVASCULAR: Normal heart rate noted, regular rhythm RESPIRATORY: Clear to  auscultation bilaterally. Effort and breath sounds normal, no problems with respiration noted. BREASTS: Symmetric in size. No masses, skin changes, nipple drainage, or lymphadenopathy. ABDOMEN: Soft, normal bowel sounds, no distention noted.  No tenderness, rebound or guarding.  PELVIC: Normal appearing external genitalia; normal appearing vaginal mucosa and cervix.  No abnormal discharge noted.  Pap smear obtained.  Normal uterine size, no other palpable masses, no uterine or adnexal tenderness.   Assessment and Plan:    1. Well woman exam with routine gynecological exam - No concerning findings on physical exam - Health care promotion labs collectd by PCP - MM 3D SCREEN BREAST BILATERAL; Future - Cytology - PAP( [place]) - Cervicovaginal ancillary only( [place])  2. Perimenopausal - On regimen for predictable bleeding for several years. Loestrin on 3 months, off 3 months refilled 03/2019  Will follow up results of pap smear and manage accordingly. Mammogram scheduled Routine preventative health maintenance measures emphasized. Please refer to After Visit Summary for other counseling recommendations.     Total visit time 30 minutes. Greater than 50% of visit spent in counseling and coordination or care  Mallie Snooks, MSN, CNM Certified Nurse Midwife, [facility] for [employer], [place] 04/25/19 6:41 PM

## 2019-04-25 NOTE — Progress Notes (Signed)
LAST PAP 01/2018-NORMAL  Sti testing

## 2019-04-27 LAB — CERVICOVAGINAL ANCILLARY ONLY
Bacterial Vaginitis (gardnerella): NEGATIVE
Candida Glabrata: NEGATIVE
Candida Vaginitis: NEGATIVE
Comment: NEGATIVE
Comment: NEGATIVE
Comment: NEGATIVE

## 2019-05-03 LAB — CYTOLOGY - PAP
Comment: NEGATIVE
Diagnosis: NEGATIVE
High risk HPV: NEGATIVE

## 2019-05-04 DIAGNOSIS — Z79899 Other long term (current) drug therapy: Secondary | ICD-10-CM | POA: Diagnosis not present

## 2019-05-04 DIAGNOSIS — F39 Unspecified mood [affective] disorder: Secondary | ICD-10-CM | POA: Diagnosis not present

## 2019-05-04 LAB — HEMOGLOBIN A1C: Hemoglobin A1C: 5.7

## 2019-05-04 LAB — LIPID PANEL
Cholesterol: 179 (ref 0–200)
HDL: 43 (ref 35–70)
LDL Cholesterol: 104

## 2019-05-04 LAB — BASIC METABOLIC PANEL
BUN: 9 (ref 4–21)
Creatinine: 0.9 (ref 0.5–1.1)
Glucose: 97
Potassium: 4.6 (ref 3.4–5.3)
Sodium: 139 (ref 137–147)

## 2019-05-04 LAB — TSH: TSH: 4.18 (ref 0.41–5.90)

## 2019-05-04 LAB — HEPATIC FUNCTION PANEL
ALT: 14 (ref 7–35)
AST: 15 (ref 13–35)
Alkaline Phosphatase: 53 (ref 25–125)
Bilirubin, Total: 0.4

## 2019-05-04 LAB — VITAMIN B12: Vitamin B-12: 677

## 2019-05-07 LAB — CBC AND DIFFERENTIAL
HCT: 42 (ref 36–46)
Hemoglobin: 14.1 (ref 12.0–16.0)
Platelets: 290 (ref 150–399)
WBC: 6.6

## 2019-05-09 ENCOUNTER — Ambulatory Visit: Payer: 59 | Admitting: Advanced Practice Midwife

## 2019-06-01 ENCOUNTER — Telehealth: Payer: Self-pay

## 2019-06-01 DIAGNOSIS — M25532 Pain in left wrist: Secondary | ICD-10-CM | POA: Diagnosis not present

## 2019-06-01 DIAGNOSIS — M67432 Ganglion, left wrist: Secondary | ICD-10-CM | POA: Diagnosis not present

## 2019-06-01 NOTE — Telephone Encounter (Signed)
Travel or Contacts:   Any travel in the past 2 weeks?  NO  Have you came in contact with anyone who has Covid? NO  Have you had a positive Covid test? NO If so, when?  Fever >100.4F []  Yes [x]  No []  Unknown  Chills []  Yes [x]  No []  Unknown  Muscle aches (myalgia) []  Yes [x]  No []  Unknown  Runny nose (rhinorrhea) []  Yes [x]  No []  Unknown  Sore throat []  Yes [x]  No []  Unknown Cough (new onset or worsening of chronic cough) []  Yes [x]  No []  Unknown  Shortness of breath (dyspnea) []  Yes [x]  No []  Unknown Nausea or vomiting []  Yes [x]  No []  Unknown  Headache []  Yes [x]  No []  Unknown  Abdominal pain  []  Yes [x]  No []  Unknown  Diarrhea (?3 loose/looser than normal stools/24hr period) []  Yes [x]  No []  Unknown Other, s:pecify  

## 2019-06-02 NOTE — Progress Notes (Addendum)
Virtual Visit via Video   Due to the COVID-19 pandemic, this visit was completed with telemedicine (audio/video) technology to reduce patient and provider exposure as well as to preserve personal protective equipment.   I connected with Mandy Torres by a video enabled telemedicine application and verified that I am speaking with the correct person using two identifiers. Location patient: Home Location provider: [facility], Office Persons participating in the virtual visit: Nakisha T Jonna Clark, MD   I discussed the limitations of evaluation and management by telemedicine and the availability of in person appointments. The patient expressed understanding and agreed to proceed.  Care Team   Patient Care Team: Lucille Passy, MD as PCP - General (Family Medicine) Dennard Nip, NP as Nurse Practitioner (Psychiatry) Orie Rout, MD as Referring Physician (Specialist) Pieter Partridge, DO as Consulting Physician (Neurology) Harriet Masson, DPM as Consulting Physician (Podiatry) Chauncey Mann, MD as Referring Physician (Psychiatry)  Subjective:   HPI:   Neurological disease without diagnosis-  Worsening word finding issues, memory issues- per pt, MRI of brain showed concerning findings for Mandy Torres age but Mandy Torres has not yet received a diagnosis.    Mood disorder- followed by Dr. Nicolasa Ducking. Last week, just stopped abilify and stopped trazodone and started zyprexa.  Mandy Torres is still taking celexa,  depakote and as needed buspar.  So far feeling good. Depression screen [facility] 2/9 06/04/2019  Decreased Interest 0  Down, Depressed, Hopeless 0  PHQ - 2 Score 0    Hypothyroidism-Just increased dose of synthroid 50 mcg daily. Followed by Dr. Buddy Duty.  Follow up labs scheduled for next month. Already feels less fatigued and less fogginess.   Review of Systems  Constitutional: Positive for fatigue. Negative for unexpected weight change.  HENT: Negative.   Eyes: Negative.    Respiratory: Negative.   Cardiovascular: Negative.   Gastrointestinal: Negative.   Endocrine: Negative.   Genitourinary: Negative.   Musculoskeletal: Negative.   Skin: Negative.   Allergic/Immunologic: Negative.   Neurological: Positive for speech difficulty. Negative for dizziness, tremors, syncope, facial asymmetry, weakness, light-headedness, numbness and headaches.  Psychiatric/Behavioral: Positive for confusion and decreased concentration. Negative for agitation, behavioral problems, dysphoric mood, hallucinations, self-injury, sleep disturbance and suicidal ideas. The patient is not nervous/anxious and is not hyperactive.   All other systems reviewed and are negative.   Patient Active Problem List   Diagnosis Date Noted  . Concern about neurological disease without diagnosis 06/04/2019  . Mood disorder ([place]) 06/04/2019  . Anal skin tag 04/14/2017  . Encounter for screening colonoscopy 04/14/2017  . Allergic rhinitis 02/14/2017  . Cervical paraspinal muscle spasm 02/14/2017  . Family history of breast cancer 11/12/2016  . Esophageal reflux   . Fatigue 11/21/2014  . Enlarged lymph node 04/12/2014  . Hypothyroidism 04/02/2014  . Prediabetes 01/25/2014  . Morbid obesity ([place]) 01/25/2014  . Abnormality of rectum 12/31/2011  . Hyperlipidemia, familial, high LDL 06/23/2011  . ADHD (attention deficit hyperactivity disorder)   . Depression   . Migraine   . HTN (hypertension) 04/03/2011    Social History   Tobacco Use  . Smoking status: Never Smoker  . Smokeless tobacco: Never Used  Substance Use Topics  . Alcohol use: No    Current Outpatient Medications:  .  azelastine (ASTELIN) 0.1 % nasal spray, , Disp: , Rfl: 12 .  Biotin 1000 MCG tablet, Take by mouth., Disp: , Rfl:  .  chlorhexidine (PERIDEX) 0.12 % solution, , Disp: , Rfl: 0 .  citalopram (CELEXA) 40 MG tablet, Take 40 mg by mouth daily., Disp: , Rfl:  .  clobetasol (TEMOVATE) 0.05 % external solution, clobetasol  0.05 % scalp solution, Disp: , Rfl:  .  divalproex (DEPAKOTE) 500 MG DR tablet, 500 mg 2 (two) times daily. , Disp: , Rfl: 1 .  ibuprofen (ADVIL,MOTRIN) 600 MG tablet, Take 600 mg by mouth once., Disp: , Rfl: 0 .  ketoconazole (NIZORAL) 2 % shampoo, , Disp: , Rfl: 6 .  levothyroxine (SYNTHROID, LEVOTHROID) 25 MCG tablet, Take 50 mcg by mouth. , Disp: , Rfl:  .  memantine (NAMENDA) 5 MG tablet, , Disp: , Rfl: 3 .  methocarbamol (ROBAXIN) 750 MG tablet, methocarbamol 750 mg tablet, Disp: 90 tablet, Rfl: 3 .  norethindrone-ethinyl estradiol (LOESTRIN) 1-20 MG-MCG tablet, TAKE 1 TABLET BY MOUTH DAILY., Disp: 63 tablet, Rfl: 6 .  OLANZapine (ZYPREXA) 2.5 MG tablet, Take 2.5 mg by mouth at bedtime., Disp: , Rfl:  .  ondansetron (ZOFRAN) 4 MG tablet, Take 4 mg by mouth every 8 (eight) hours as needed for nausea or vomiting., Disp: , Rfl:  .  pseudoephedrine (SUDAFED) 60 MG tablet, Take 60 mg by mouth every 4 (four) hours as needed for congestion., Disp: , Rfl:  .  rizatriptan (MAXALT) 10 MG tablet, TAKE 1 TABLET BY MOUTH AS NEEDED. REPEAT IN 2 HOURS IF NEEDED., Disp: 9 tablet, Rfl: 5 .  triamcinolone (NASACORT) 55 MCG/ACT AERO nasal inhaler, Place into the nose., Disp: , Rfl:  .  busPIRone (BUSPAR) 10 MG tablet, , Disp: , Rfl: 0 .  cetirizine (ZYRTEC) 10 MG tablet, Take by mouth., Disp: , Rfl:  .  clindamycin (CLEOCIN) 300 MG capsule, clindamycin HCl 300 mg capsule, Disp: , Rfl:  .  CONCERTA 18 MG CR tablet, , Disp: , Rfl: 0 .  Desoximetasone (TOPICORT) 0.25 % LIQD, Topicort 0.25 % topical spray, Disp: , Rfl:  .  docusate sodium (COLACE) 100 MG capsule, Take by mouth., Disp: , Rfl:  .  hydrOXYzine (VISTARIL) 25 MG capsule, , Disp: , Rfl:  .  Lactobacillus Rhamnosus, GG, (CULTURELLE) CAPS, Take by mouth., Disp: , Rfl:  .  Loratadine 10 MG CAPS, Take by mouth., Disp: , Rfl:  .  Probiotic Product (TRUBIOTICS PO), Take by mouth., Disp: , Rfl:  .  saccharomyces boulardii (FLORASTOR) 250 MG capsule, Take  by mouth., Disp: , Rfl:   Allergies  Allergen Reactions  . Diazepam Other (See Comments)    Other Reaction: Other reaction Hyperactivity  Hyperactivity  Other Reaction: Other reaction  . Oxycodone-Acetaminophen Nausea Only and Nausea And Vomiting    Also Endocet Also Endocet  . Penicillins Hives and Nausea Only  . Amphetamine-Dextroamphetamine Other (See Comments)    Hyperactivity, "extra happy"  . Avelox [Moxifloxacin Hcl In Nacl] Hives  . Cheese     FETA CHEESE ONLY-MIGRAINES  . Endocet [Oxycodone-Acetaminophen] Nausea Only  . Epitol [Carbamazepine] Hives    Severe hives and swelling  . Levofloxacin Nausea And Vomiting  . Levofloxacin Nausea Only  . Moxifloxacin Hives  . Niacin And Related     ONLY IF LARGE AMOUNTS IN FOOD ONLY-MIGRAINES  . Percocet [Oxycodone-Acetaminophen] Nausea And Vomiting    Also Endocet  . Ranitidine Nausea And Vomiting  . Sulfa Antibiotics Hives  . Topamax     congnitive impairmant  . Topiramate Other (See Comments)    Cognitive impairment   . Valium     Hyperactivity     Objective:  Ht 5\' 2"  (1.575 m)  Wt 187 lb (84.8 kg)   BMI 34.20 kg/m   VITALS: Per patient if applicable, see vitals. GENERAL: Alert, appears well and in no acute distress. HEENT: Atraumatic, conjunctiva clear, no obvious abnormalities on inspection of external nose and ears. NECK: Normal movements of the head and neck. CARDIOPULMONARY: No increased WOB. Speaking in clear sentences. I:E ratio WNL.  MS: Moves all visible extremities without noticeable abnormality. PSYCH: Pleasant and cooperative, well-groomed. Speech normal rate and rhythm. Affect is appropriate. Insight and judgement are appropriate. Attention is focused, linear, and appropriate.  NEURO: CN grossly intact. Oriented as arrived to appointment on time with no prompting. Moves both UE equally.  SKIN: No obvious lesions, wounds, erythema, or cyanosis noted on face or hands.  Depression screen PHQ 2/9  06/04/2019  Decreased Interest 0  Down, Depressed, Hopeless 0  PHQ - 2 Score 0    Assessment and Plan:   Mandy Torres was seen today for follow-up.  Diagnoses and all orders for this visit:  Episode of recurrent major depressive disorder, unspecified depression episode severity ([place])  Attention deficit hyperactivity disorder (ADHD), unspecified ADHD type  Seasonal allergic rhinitis due to pollen  Essential hypertension  Hyperlipidemia, familial, high LDL  Chronic migraine without aura without status migrainosus, not intractable  Prediabetes  Morbid obesity ([place])  Hypothyroidism, unspecified type  Concern about neurological disease without diagnosis  Mood disorder ([place])  Other orders -     methocarbamol (ROBAXIN) 750 MG tablet; methocarbamol 750 mg tablet    . COVID-19 Education: The signs and Torres of COVID-19 were discussed with the patient and how to seek care for testing if needed. The importance of social distancing was discussed today. . Reviewed expectations re: course of current medical issues. . Discussed self-management of Torres. . Outlined signs and Torres indicating need for more acute intervention. . Patient verbalized understanding and all questions were answered. Marland Kitchen Health Maintenance issues including appropriate healthy diet, exercise, and smoking avoidance were discussed with patient. . See orders for this visit as documented in the electronic medical record.  Arnette Norris, MD  Records requested if needed. Time spent: 40 minutes, of which >50% was spent in obtaining information about Mandy Torres, reviewing Mandy Torres previous labs, evaluations, and treatments, counseling Mandy Torres about Mandy Torres condition (please see the discussed topics above), and developing a plan to further investigate it; Mandy Torres had a number of questions which I addressed.     Diabetic Foot Exam - Simple   No data filed     Lab Results  Component Value Date   WBC 6.6 05/04/2019   HGB 14.1  05/04/2019   HCT 42 05/04/2019   PLT 290 05/04/2019   GLUCOSE 84 02/01/2018   CHOL 179 05/04/2019   TRIG 112 11/12/2016   HDL 43 05/04/2019   LDLDIRECT 137 (H) 01/25/2014   LDLCALC 104 05/04/2019   ALT 14 05/04/2019   AST 15 05/04/2019   NA 139 05/04/2019   K 4.6 05/04/2019   CL 97 02/01/2018   CREATININE 0.9 05/04/2019   BUN 9 05/04/2019   CO2 29 02/01/2018   TSH 4.18 05/04/2019   HGBA1C 5.7 05/04/2019   MICROALBUR 0.1 04/01/2011    Lab Results  Component Value Date   TSH 4.18 05/04/2019   Lab Results  Component Value Date   WBC 6.6 05/04/2019   HGB 14.1 05/04/2019   HCT 42 05/04/2019   MCV 95.6 01/25/2018   PLT 290 05/04/2019   Lab Results  Component Value Date   NA  139 05/04/2019   K 4.6 05/04/2019   CO2 29 02/01/2018   GLUCOSE 84 02/01/2018   BUN 9 05/04/2019   CREATININE 0.9 05/04/2019   BILITOT 0.3 01/25/2018   ALKPHOS 53 05/04/2019   AST 15 05/04/2019   ALT 14 05/04/2019   PROT 6.0 01/25/2018   ALBUMIN 3.8 01/25/2018   CALCIUM 8.4 02/01/2018   GFR 91.54 02/01/2018   Lab Results  Component Value Date   CHOL 179 05/04/2019   Lab Results  Component Value Date   HDL 43 05/04/2019   Lab Results  Component Value Date   LDLCALC 104 05/04/2019   Lab Results  Component Value Date   TRIG 112 11/12/2016   Lab Results  Component Value Date   CHOLHDL 3.7 11/12/2016   Lab Results  Component Value Date   HGBA1C 5.7 05/04/2019       Assessment & Plan:   Problem List Items Addressed This Visit      Active Problems   HTN (hypertension)   Hyperlipidemia, familial, high LDL   ADHD (attention deficit hyperactivity disorder)   Depression - Primary    Followed by Dr. Nicolasa Ducking.       Relevant Medications   citalopram (CELEXA) 40 MG tablet   Migraine   Relevant Medications   citalopram (CELEXA) 40 MG tablet   methocarbamol (ROBAXIN) 750 MG tablet   Prediabetes    Lab Results  Component Value Date   HGBA1C 5.7 05/04/2019         Morbid  obesity ([place])   Hypothyroidism    Just increased dose of synthroid 50 mcg daily. Followed by Dr. Buddy Duty.  Follow up labs scheduled for next month. Already feels less fatigued and less fogginess.      Allergic rhinitis   Concern about neurological disease without diagnosis    Followed by Dr. Manuella Ghazi- unclear diagnosis, started on namenda twice daily about a year ago.  Initially saw an improvement with memory and word finding issues but now Mandy Torres is having those issues Torres.  Seeing Dr. Trena Platt PA this week.        Mood disorder ([place])    Followed by Dr. Nicolasa Ducking. Last week, just stopped abilify and stopped trazodone and started zyprexa.  Mandy Torres is still taking celexa,  depakote and as needed buspar.  So far feeling good.  Depression screen [facility] 2/9 06/04/2019  Decreased Interest 0  Down, Depressed, Hopeless 0  PHQ - 2 Score 0            I have discontinued Virjean T. Avans's Vitamin D (Cholecalciferol), levocetirizine, hydrochlorothiazide, lurasidone, ARIPiprazole, and traZODone. I am also having Mandy Torres maintain Mandy Torres Probiotic Product (TRUBIOTICS PO), pseudoephedrine, ibuprofen, ketoconazole, Concerta, busPIRone, chlorhexidine, cetirizine, Desoximetasone, docusate sodium, Culturelle, azelastine, Loratadine, saccharomyces boulardii, Biotin, divalproex, levothyroxine, triamcinolone, memantine, rizatriptan, norethindrone-ethinyl estradiol, clindamycin, clobetasol, hydrOXYzine, OLANZapine, citalopram, methocarbamol, and ondansetron.  Meds ordered this encounter  Medications  . methocarbamol (ROBAXIN) 750 MG tablet    Sig: methocarbamol 750 mg tablet    Dispense:  90 tablet    Refill:  3     Arnette Norris, MD

## 2019-06-02 NOTE — Assessment & Plan Note (Signed)
Lab Results  Component Value Date   HGBA1C 5.7 05/04/2019

## 2019-06-02 NOTE — Assessment & Plan Note (Signed)
Followed by Dr Kapur.  

## 2019-06-04 ENCOUNTER — Telehealth: Payer: Self-pay

## 2019-06-04 ENCOUNTER — Encounter: Payer: Self-pay | Admitting: Family Medicine

## 2019-06-04 ENCOUNTER — Ambulatory Visit (INDEPENDENT_AMBULATORY_CARE_PROVIDER_SITE_OTHER): Payer: 59 | Admitting: Family Medicine

## 2019-06-04 ENCOUNTER — Other Ambulatory Visit: Payer: Self-pay

## 2019-06-04 VITALS — Ht 62.0 in | Wt 187.0 lb

## 2019-06-04 DIAGNOSIS — E039 Hypothyroidism, unspecified: Secondary | ICD-10-CM

## 2019-06-04 DIAGNOSIS — G43709 Chronic migraine without aura, not intractable, without status migrainosus: Secondary | ICD-10-CM | POA: Diagnosis not present

## 2019-06-04 DIAGNOSIS — J301 Allergic rhinitis due to pollen: Secondary | ICD-10-CM

## 2019-06-04 DIAGNOSIS — F39 Unspecified mood [affective] disorder: Secondary | ICD-10-CM | POA: Diagnosis not present

## 2019-06-04 DIAGNOSIS — R7303 Prediabetes: Secondary | ICD-10-CM

## 2019-06-04 DIAGNOSIS — E7849 Other hyperlipidemia: Secondary | ICD-10-CM

## 2019-06-04 DIAGNOSIS — F909 Attention-deficit hyperactivity disorder, unspecified type: Secondary | ICD-10-CM

## 2019-06-04 DIAGNOSIS — Z711 Person with feared health complaint in whom no diagnosis is made: Secondary | ICD-10-CM | POA: Diagnosis not present

## 2019-06-04 DIAGNOSIS — F339 Major depressive disorder, recurrent, unspecified: Secondary | ICD-10-CM | POA: Diagnosis not present

## 2019-06-04 DIAGNOSIS — I10 Essential (primary) hypertension: Secondary | ICD-10-CM | POA: Diagnosis not present

## 2019-06-04 MED ORDER — METHOCARBAMOL 750 MG PO TABS: ORAL_TABLET | ORAL | 3 refills | Status: AC

## 2019-06-04 NOTE — Assessment & Plan Note (Signed)
Followed by Dr. Manuella Ghazi- unclear diagnosis, started on namenda twice daily about a year ago.  Initially saw an improvement with memory and word finding issues but now she is having those issues again.  Seeing Dr. Trena Platt PA this week.

## 2019-06-04 NOTE — Assessment & Plan Note (Addendum)
Followed by Dr. Nicolasa Ducking. Last week, just stopped abilify and stopped trazodone and started zyprexa.  She is still taking celexa,  depakote and as needed buspar.  So far feeling good.  Depression screen [facility] 2/9 06/04/2019  Decreased Interest 0  Down, Depressed, Hopeless 0  PHQ - 2 Score 0

## 2019-06-04 NOTE — Telephone Encounter (Signed)
Pharmacy called - Rx for methocarbamol doesn't have any directions. Please advise.

## 2019-06-04 NOTE — Telephone Encounter (Signed)
Can you please clarify directions/how she is taking it with patient and then update the pharmacy?  Thanks!

## 2019-06-04 NOTE — Assessment & Plan Note (Signed)
Just increased dose of synthroid 50 mcg daily. Followed by Dr. Buddy Duty.  Follow up labs scheduled for next month. Already feels less fatigued and less fogginess.

## 2019-06-04 NOTE — Telephone Encounter (Signed)
I called pt to clarify directions of medication.  Pt explained that it was previously prescribed for 4 times a day as needed.  Pt said that she did not take it 4 times a day but that was the directions on script.  I called pharmacy and spoke with Caryl Pina to give directions.

## 2019-06-15 DIAGNOSIS — H524 Presbyopia: Secondary | ICD-10-CM | POA: Diagnosis not present

## 2019-06-19 ENCOUNTER — Other Ambulatory Visit (HOSPITAL_COMMUNITY): Payer: Self-pay | Admitting: Neurology

## 2019-06-19 ENCOUNTER — Other Ambulatory Visit: Payer: Self-pay | Admitting: Neurology

## 2019-06-19 DIAGNOSIS — R4189 Other symptoms and signs involving cognitive functions and awareness: Secondary | ICD-10-CM | POA: Diagnosis not present

## 2019-06-22 DIAGNOSIS — Z8349 Family history of other endocrine, nutritional and metabolic diseases: Secondary | ICD-10-CM | POA: Diagnosis not present

## 2019-06-22 DIAGNOSIS — E063 Autoimmune thyroiditis: Secondary | ICD-10-CM | POA: Diagnosis not present

## 2019-06-28 ENCOUNTER — Ambulatory Visit: Payer: 59

## 2019-06-29 ENCOUNTER — Other Ambulatory Visit: Payer: 59

## 2019-07-04 ENCOUNTER — Ambulatory Visit: Admit: 2019-07-04 | Payer: 59 | Admitting: General Surgery

## 2019-07-04 SURGERY — COLONOSCOPY WITH PROPOFOL
Anesthesia: General

## 2019-07-17 ENCOUNTER — Ambulatory Visit: Admission: RE | Admit: 2019-07-17 | Payer: 59 | Source: Ambulatory Visit

## 2019-07-20 DIAGNOSIS — F902 Attention-deficit hyperactivity disorder, combined type: Secondary | ICD-10-CM | POA: Diagnosis not present

## 2019-07-20 DIAGNOSIS — F39 Unspecified mood [affective] disorder: Secondary | ICD-10-CM | POA: Diagnosis not present

## 2019-08-10 DIAGNOSIS — F39 Unspecified mood [affective] disorder: Secondary | ICD-10-CM | POA: Diagnosis not present

## 2019-08-10 DIAGNOSIS — F902 Attention-deficit hyperactivity disorder, combined type: Secondary | ICD-10-CM | POA: Diagnosis not present

## 2019-08-17 DIAGNOSIS — M25552 Pain in left hip: Secondary | ICD-10-CM | POA: Diagnosis not present

## 2019-08-25 DIAGNOSIS — F902 Attention-deficit hyperactivity disorder, combined type: Secondary | ICD-10-CM | POA: Diagnosis not present

## 2019-08-25 DIAGNOSIS — F39 Unspecified mood [affective] disorder: Secondary | ICD-10-CM | POA: Diagnosis not present

## 2019-08-31 DIAGNOSIS — M1711 Unilateral primary osteoarthritis, right knee: Secondary | ICD-10-CM | POA: Diagnosis not present

## 2019-08-31 DIAGNOSIS — M25511 Pain in right shoulder: Secondary | ICD-10-CM | POA: Diagnosis not present

## 2019-08-31 DIAGNOSIS — M25552 Pain in left hip: Secondary | ICD-10-CM | POA: Diagnosis not present

## 2019-08-31 DIAGNOSIS — M7541 Impingement syndrome of right shoulder: Secondary | ICD-10-CM | POA: Diagnosis not present

## 2019-08-31 DIAGNOSIS — M19011 Primary osteoarthritis, right shoulder: Secondary | ICD-10-CM | POA: Diagnosis not present

## 2019-09-13 DIAGNOSIS — M25552 Pain in left hip: Secondary | ICD-10-CM | POA: Insufficient documentation

## 2019-09-14 DIAGNOSIS — M24152 Other articular cartilage disorders, left hip: Secondary | ICD-10-CM | POA: Diagnosis not present

## 2019-09-14 DIAGNOSIS — M25552 Pain in left hip: Secondary | ICD-10-CM | POA: Diagnosis not present

## 2019-10-05 DIAGNOSIS — M25512 Pain in left shoulder: Secondary | ICD-10-CM | POA: Diagnosis not present

## 2019-10-05 DIAGNOSIS — M1711 Unilateral primary osteoarthritis, right knee: Secondary | ICD-10-CM | POA: Diagnosis not present

## 2019-10-05 DIAGNOSIS — M7541 Impingement syndrome of right shoulder: Secondary | ICD-10-CM | POA: Diagnosis not present

## 2019-10-05 DIAGNOSIS — M7542 Impingement syndrome of left shoulder: Secondary | ICD-10-CM | POA: Diagnosis not present

## 2019-10-08 ENCOUNTER — Other Ambulatory Visit: Payer: Self-pay

## 2019-10-08 ENCOUNTER — Ambulatory Visit
Admission: RE | Admit: 2019-10-08 | Discharge: 2019-10-08 | Disposition: A | Discharge status: Home / Self Care | Payer: 59 | Source: Ambulatory Visit | Attending: Neurology | Admitting: Neurology

## 2019-10-08 DIAGNOSIS — R413 Other amnesia: Secondary | ICD-10-CM | POA: Diagnosis not present

## 2019-10-08 DIAGNOSIS — R4189 Other symptoms and signs involving cognitive functions and awareness: Secondary | ICD-10-CM | POA: Insufficient documentation

## 2019-10-08 MED ORDER — GADOBUTROL 1 MMOL/ML IV SOLN
8.0000 mL | Freq: Once | INTRAVENOUS | Status: AC | PRN
  Administered 2019-10-08: 8 mL via INTRAVENOUS

## 2019-10-19 DIAGNOSIS — F902 Attention-deficit hyperactivity disorder, combined type: Secondary | ICD-10-CM | POA: Diagnosis not present

## 2019-10-19 DIAGNOSIS — F39 Unspecified mood [affective] disorder: Secondary | ICD-10-CM | POA: Diagnosis not present

## 2019-11-02 DIAGNOSIS — R4189 Other symptoms and signs involving cognitive functions and awareness: Secondary | ICD-10-CM | POA: Diagnosis not present

## 2019-11-05 DIAGNOSIS — F902 Attention-deficit hyperactivity disorder, combined type: Secondary | ICD-10-CM | POA: Diagnosis not present

## 2019-11-05 DIAGNOSIS — F39 Unspecified mood [affective] disorder: Secondary | ICD-10-CM | POA: Diagnosis not present

## 2019-11-14 DIAGNOSIS — F39 Unspecified mood [affective] disorder: Secondary | ICD-10-CM | POA: Diagnosis not present

## 2019-11-14 DIAGNOSIS — M1711 Unilateral primary osteoarthritis, right knee: Secondary | ICD-10-CM | POA: Diagnosis not present

## 2019-11-19 DIAGNOSIS — F39 Unspecified mood [affective] disorder: Secondary | ICD-10-CM | POA: Diagnosis not present

## 2019-11-21 DIAGNOSIS — M1711 Unilateral primary osteoarthritis, right knee: Secondary | ICD-10-CM | POA: Diagnosis not present

## 2019-11-24 DIAGNOSIS — F39 Unspecified mood [affective] disorder: Secondary | ICD-10-CM | POA: Diagnosis not present

## 2019-11-24 DIAGNOSIS — F902 Attention-deficit hyperactivity disorder, combined type: Secondary | ICD-10-CM | POA: Diagnosis not present

## 2019-11-24 DIAGNOSIS — Z79899 Other long term (current) drug therapy: Secondary | ICD-10-CM | POA: Diagnosis not present

## 2019-11-30 DIAGNOSIS — M1711 Unilateral primary osteoarthritis, right knee: Secondary | ICD-10-CM | POA: Diagnosis not present

## 2019-12-04 DIAGNOSIS — M76892 Other specified enthesopathies of left lower limb, excluding foot: Secondary | ICD-10-CM | POA: Diagnosis not present

## 2019-12-04 DIAGNOSIS — M25552 Pain in left hip: Secondary | ICD-10-CM | POA: Diagnosis not present

## 2019-12-04 DIAGNOSIS — M25551 Pain in right hip: Secondary | ICD-10-CM | POA: Diagnosis not present

## 2019-12-06 ENCOUNTER — Encounter: Payer: Self-pay | Admitting: Radiology

## 2019-12-06 DIAGNOSIS — R4189 Other symptoms and signs involving cognitive functions and awareness: Secondary | ICD-10-CM | POA: Diagnosis not present

## 2019-12-06 DIAGNOSIS — Z006 Encounter for examination for normal comparison and control in clinical research program: Secondary | ICD-10-CM | POA: Diagnosis not present

## 2019-12-06 DIAGNOSIS — Z1231 Encounter for screening mammogram for malignant neoplasm of breast: Secondary | ICD-10-CM | POA: Diagnosis not present

## 2019-12-11 DIAGNOSIS — F411 Generalized anxiety disorder: Secondary | ICD-10-CM | POA: Diagnosis not present

## 2019-12-11 DIAGNOSIS — F902 Attention-deficit hyperactivity disorder, combined type: Secondary | ICD-10-CM | POA: Diagnosis not present

## 2019-12-11 DIAGNOSIS — F39 Unspecified mood [affective] disorder: Secondary | ICD-10-CM | POA: Diagnosis not present

## 2019-12-12 ENCOUNTER — Ambulatory Visit (INDEPENDENT_AMBULATORY_CARE_PROVIDER_SITE_OTHER): Payer: 59 | Admitting: *Deleted

## 2019-12-12 ENCOUNTER — Other Ambulatory Visit (HOSPITAL_COMMUNITY)
Admission: RE | Admit: 2019-12-12 | Discharge: 2019-12-12 | Disposition: A | Discharge status: Home / Self Care | Payer: 59 | Source: Ambulatory Visit | Attending: Family Medicine | Admitting: Family Medicine

## 2019-12-12 ENCOUNTER — Other Ambulatory Visit: Payer: Self-pay

## 2019-12-12 VITALS — BP 116/78 | HR 83

## 2019-12-12 DIAGNOSIS — N898 Other specified noninflammatory disorders of vagina: Secondary | ICD-10-CM | POA: Insufficient documentation

## 2019-12-12 NOTE — Progress Notes (Signed)
Attestation of Attending Supervision of clinical support staff: I agree with the care provided to this patient and was available for any consultation.  I have reviewed the RN's note and chart. I was available for consult and to see the patient if needed.   Modell Fendrick Niles Timberly Yott, MD, MPH, ABFM Attending Physician [facility]- [facility]  

## 2019-12-12 NOTE — Progress Notes (Signed)
SUBJECTIVE:  49 y.o. female complains of vaginal odor and pressure on her left side a few  day(s). Denies abnormal vaginal bleeding or significant pelvic pain or fever. No UTI symptoms. Denies history of known exposure to STD.  No LMP recorded. (Menstrual status: Oral contraceptives).  OBJECTIVE:  She appears well, afebrile. Urine dipstick: not done.  ASSESSMENT:   Vaginal Odor   PLAN:  GC, chlamydia, trichomonas, BVAG, CVAG probe sent to lab. Treatment: To be determined once lab results are received ROV prn if symptoms persist or worsen.

## 2019-12-13 ENCOUNTER — Telehealth: Payer: Self-pay | Admitting: *Deleted

## 2019-12-13 LAB — CERVICOVAGINAL ANCILLARY ONLY
Bacterial Vaginitis (gardnerella): NEGATIVE
Candida Glabrata: NEGATIVE
Candida Vaginitis: POSITIVE — AB
Chlamydia: NEGATIVE
Comment: NEGATIVE
Comment: NEGATIVE
Comment: NEGATIVE
Comment: NEGATIVE
Comment: NEGATIVE
Comment: NORMAL
Neisseria Gonorrhea: NEGATIVE
Trichomonas: NEGATIVE

## 2019-12-13 MED ORDER — FLUCONAZOLE 150 MG PO TABS: 150.0000 mg | ORAL_TABLET | Freq: Once | ORAL | 1 refills | Status: AC

## 2019-12-13 NOTE — Telephone Encounter (Signed)
Pt informed of results and would ike diflucan over the cream. Will send to pharmacy.

## 2019-12-13 NOTE — Telephone Encounter (Signed)
-----   Message from Caren Macadam, MD sent at 12/13/2019  1:36 PM EDT ----- Yeast present please treat per standing order

## 2019-12-19 DIAGNOSIS — M6281 Muscle weakness (generalized): Secondary | ICD-10-CM | POA: Diagnosis not present

## 2019-12-19 DIAGNOSIS — M25551 Pain in right hip: Secondary | ICD-10-CM | POA: Diagnosis not present

## 2019-12-19 DIAGNOSIS — M25552 Pain in left hip: Secondary | ICD-10-CM | POA: Diagnosis not present

## 2019-12-20 DIAGNOSIS — F39 Unspecified mood [affective] disorder: Secondary | ICD-10-CM | POA: Diagnosis not present

## 2020-01-02 ENCOUNTER — Ambulatory Visit: Payer: 59 | Admitting: Advanced Practice Midwife

## 2020-01-04 DIAGNOSIS — M25561 Pain in right knee: Secondary | ICD-10-CM | POA: Diagnosis not present

## 2020-02-01 ENCOUNTER — Other Ambulatory Visit: Payer: Self-pay | Admitting: Internal Medicine

## 2020-02-05 ENCOUNTER — Other Ambulatory Visit: Payer: Self-pay | Admitting: Psychiatry

## 2020-04-21 ENCOUNTER — Other Ambulatory Visit: Payer: Self-pay | Admitting: Family Medicine

## 2020-04-21 DIAGNOSIS — Z3041 Encounter for surveillance of contraceptive pills: Secondary | ICD-10-CM

## 2020-05-09 ENCOUNTER — Other Ambulatory Visit: Payer: Self-pay | Admitting: Psychiatry

## 2020-06-27 ENCOUNTER — Other Ambulatory Visit: Payer: Self-pay | Admitting: Psychiatry

## 2020-07-15 ENCOUNTER — Other Ambulatory Visit: Payer: Self-pay | Admitting: Family Medicine

## 2020-07-25 ENCOUNTER — Other Ambulatory Visit: Payer: Self-pay | Admitting: Internal Medicine

## 2020-07-29 ENCOUNTER — Other Ambulatory Visit: Payer: Self-pay | Admitting: Internal Medicine

## 2020-08-08 ENCOUNTER — Other Ambulatory Visit: Payer: Self-pay | Admitting: Dermatology

## 2020-08-13 ENCOUNTER — Other Ambulatory Visit: Payer: Self-pay | Admitting: Internal Medicine

## 2020-08-22 ENCOUNTER — Other Ambulatory Visit: Payer: Self-pay | Admitting: Internal Medicine

## 2020-08-26 ENCOUNTER — Other Ambulatory Visit: Payer: Self-pay | Admitting: Dermatology

## 2020-09-19 ENCOUNTER — Other Ambulatory Visit: Payer: Self-pay | Admitting: Psychiatry

## 2020-11-12 ENCOUNTER — Other Ambulatory Visit (HOSPITAL_COMMUNITY): Payer: Self-pay | Admitting: Internal Medicine

## 2020-11-12 ENCOUNTER — Other Ambulatory Visit: Payer: Self-pay | Admitting: Internal Medicine

## 2020-11-12 DIAGNOSIS — M79606 Pain in leg, unspecified: Secondary | ICD-10-CM

## 2020-11-12 DIAGNOSIS — E063 Autoimmune thyroiditis: Secondary | ICD-10-CM | POA: Insufficient documentation

## 2020-11-26 DIAGNOSIS — M79642 Pain in left hand: Secondary | ICD-10-CM | POA: Insufficient documentation

## 2020-12-09 ENCOUNTER — Other Ambulatory Visit: Payer: Self-pay | Admitting: Neurology

## 2020-12-09 DIAGNOSIS — R4189 Other symptoms and signs involving cognitive functions and awareness: Secondary | ICD-10-CM

## 2020-12-22 ENCOUNTER — Ambulatory Visit (HOSPITAL_COMMUNITY): Payer: 59

## 2020-12-22 ENCOUNTER — Encounter (HOSPITAL_COMMUNITY): Payer: Self-pay

## 2020-12-26 ENCOUNTER — Other Ambulatory Visit: Payer: Self-pay

## 2020-12-26 ENCOUNTER — Ambulatory Visit: Payer: 59

## 2020-12-26 ENCOUNTER — Ambulatory Visit (HOSPITAL_COMMUNITY)
Admission: RE | Admit: 2020-12-26 | Discharge: 2020-12-26 | Disposition: A | Discharge status: Home / Self Care | Payer: Managed Care, Other (non HMO) | Source: Ambulatory Visit | Attending: Neurology | Admitting: Neurology

## 2020-12-26 DIAGNOSIS — R4189 Other symptoms and signs involving cognitive functions and awareness: Secondary | ICD-10-CM

## 2020-12-26 MED ORDER — GADOBUTROL 1 MMOL/ML IV SOLN
8.0000 mL | Freq: Once | INTRAVENOUS | Status: AC | PRN
  Administered 2020-12-26: 8 mL via INTRAVENOUS

## 2021-01-02 ENCOUNTER — Ambulatory Visit (INDEPENDENT_AMBULATORY_CARE_PROVIDER_SITE_OTHER): Payer: Managed Care, Other (non HMO) | Admitting: Vascular Surgery

## 2021-01-02 ENCOUNTER — Other Ambulatory Visit: Payer: Self-pay

## 2021-01-02 ENCOUNTER — Encounter (INDEPENDENT_AMBULATORY_CARE_PROVIDER_SITE_OTHER): Payer: Self-pay | Admitting: Vascular Surgery

## 2021-01-02 VITALS — BP 120/77 | HR 72 | Ht 62.0 in | Wt 201.0 lb

## 2021-01-02 DIAGNOSIS — I10 Essential (primary) hypertension: Secondary | ICD-10-CM

## 2021-01-02 DIAGNOSIS — R7303 Prediabetes: Secondary | ICD-10-CM | POA: Diagnosis not present

## 2021-01-02 DIAGNOSIS — M7989 Other specified soft tissue disorders: Secondary | ICD-10-CM

## 2021-01-02 NOTE — Assessment & Plan Note (Signed)
blood pressure control important in reducing the progression of atherosclerotic disease. On appropriate oral medications.  

## 2021-01-02 NOTE — Progress Notes (Signed)
Patient ID: Mandy Torres, female   DOB: [birth year], 50 y.o.   MRN: [id number]  Chief Complaint  Patient presents with   New Patient (Initial Visit)    NP Kalisetti. Venous insuff     HPI Mandy Torres is a 50 y.o. female.  I am asked to see the patient by Dr. Nemiah Commander for evaluation of leg swelling.  The patient presents with complaints of swelling over the past 6 months or so.  This started after she got COVID and has not really gotten better.  This profoundly reduced her activity level for several months.  She says she is barely getting back to her baseline now.  Both legs are swelling about the same.  She had an echocardiogram which was unrevealing.  She has no history of lung disease to her knowledge prior to COVID.  She has no history of renal dysfunction to her knowledge.     Past Medical History:  Diagnosis Date   ADHD (attention deficit hyperactivity disorder)    Valinda Hoar   Allergy    Anemia    BRCA negative 05/27/2016   variant of uncertain significance found in BRCA2   Complication of anesthesia    Depression    DJD (degenerative joint disease), cervical    neurosurg - Dr. Darcus Austin [facility]   Foot neuroma    GERD (gastroesophageal reflux disease)    Hiatal hernia    History of breast biopsy 05/2001   Hx MRSA infection    X 2-LAST HAD IN 2015   Hypertension    Hypothyroidism    Migraine    PONV (postoperative nausea and vomiting)    Thyroid disease 2017   TMJ disease     Past Surgical History:  Procedure Laterality Date   36 HOUR PH STUDY N/A 12/31/2015   Procedure: 24 HOUR PH STUDY;  Surgeon: Midge Minium, MD;  Location: [facility] ENDOSCOPY;  Service: Endoscopy;  Laterality: N/A;   APPENDECTOMY     BREAST SURGERY     normal biopsy 2002, left   CHOLECYSTECTOMY     ESOPHAGEAL MANOMETRY N/A 12/17/2015   Procedure: ESOPHAGEAL MANOMETRY (EM);  Surgeon: Midge Minium, MD;  Location: [facility] ENDOSCOPY;  Service: Endoscopy;  Laterality: N/A;   FACIAL  RECONSTRUCTION SURGERY  2/88, 9/88, 2/89   HIATAL HERNIA REPAIR  02/03/2016   Effie Shy, M.D.   KNEE SURGERY  11/97 and 4/93   right   METATARSAL OSTEOTOMY WITH BUNIONECTOMY  2016   NASAL SINUS SURGERY  2006 and 1998   deviated septum, ethmoids, Dr. Sherrie Mustache then Willeen Cass   OOPHORECTOMY Right    Right and Left Foot Surgery  06/2016    Family History  Problem Relation Age of Onset   Cancer Mother 54       Breast   Diabetes Mother    Thyroid disease Mother    Cancer Sister 43       Half sister, Breast   Cancer Maternal Aunt        Breast   Diabetes Maternal Aunt    Thyroid disease Maternal Aunt    Heart disease Maternal Grandmother    Pancreatic cancer Maternal Grandfather       Social History   Tobacco Use   Smoking status: Never   Smokeless tobacco: Never  Vaping Use   Vaping Use: Never used  Substance Use Topics   Alcohol use: No   Drug use: No     Allergies  Allergen Reactions   Diazepam  Other (See Comments)    Other Reaction: Other reaction Hyperactivity  Hyperactivity  Other Reaction: Other reaction   Oxycodone-Acetaminophen Nausea Only and Nausea And Vomiting    Also Endocet Also Endocet   Penicillins Hives and Nausea Only   Amphetamine-Dextroamphetamine Other (See Comments)    Hyperactivity, "extra happy"   Avelox [Moxifloxacin Hcl In Nacl] Hives   Cheese     FETA CHEESE ONLY-MIGRAINES   Endocet [Oxycodone-Acetaminophen] Nausea Only   Epitol [Carbamazepine] Hives    Severe hives and swelling   Levofloxacin Nausea And Vomiting   Levofloxacin Nausea Only   Moxifloxacin Hives   Niacin And Related     ONLY IF LARGE AMOUNTS IN FOOD ONLY-MIGRAINES   Percocet [Oxycodone-Acetaminophen] Nausea And Vomiting    Also Endocet   Ranitidine Nausea And Vomiting   Sulfa Antibiotics Hives   Topamax     congnitive impairmant   Topiramate Other (See Comments)    Cognitive impairment    Valium     Hyperactivity     Current Outpatient Medications   Medication Sig Dispense Refill   albuterol (PROVENTIL) (2.5 MG/3ML) 0.083% nebulizer solution TAKE 3 MLS BY NEBULIZATION EVERY 6 HOURS AS NEEDED FOR WHEEZING 75 mL 0   Amantadine HCl 100 MG tablet Take by mouth.     ARIPiprazole (ABILIFY) 5 MG tablet TAKE 1 TABLET BY MOUTH DAILY 90 tablet 1   ARIPiprazole (ABILIFY) 5 MG tablet TAKE 1 TABLET BY MOUTH DAILY 90 tablet 1   azelastine (ASTELIN) 0.1 % nasal spray   12   azithromycin (ZITHROMAX) 250 MG tablet TAKE 2 TABLETS BY MOUTH ON DAY 1. TAKE 1 TABLET BY MOUTH ON DAYS 2-5. 6 tablet 0   Biotin 1000 MCG tablet Take by mouth.     budesonide (PULMICORT) 1 MG/2ML nebulizer solution INHALE THE CONTENTS OF 1 VIAL IN NEBULIZER 2 TIMES DAILY 60 mL 0   busPIRone (BUSPAR) 10 MG tablet   0   cetirizine (ZYRTEC) 10 MG tablet Take by mouth.     chlorhexidine (PERIDEX) 0.12 % solution   0   citalopram (CELEXA) 20 MG tablet TAKE 1 TABLET BY MOUTH ONCE DAILY 90 tablet 1   citalopram (CELEXA) 40 MG tablet Take 40 mg by mouth daily.     citalopram (CELEXA) 40 MG tablet TAKE 1 TABLET BY MOUTH DAILY WITH BREAKFAST 90 tablet 0   citalopram (CELEXA) 40 MG tablet TAKE 1 TABLET BY MOUTH ONCE DAILY WITH BREAKFAST 90 tablet 0   clindamycin (CLEOCIN) 300 MG capsule clindamycin HCl 300 mg capsule     clobetasol (TEMOVATE) 0.05 % external solution clobetasol 0.05 % scalp solution     clobetasol ointment (TEMOVATE) 9.67 % APPLY ONE APPLICATION TOPICAL 2 TIMES A DAY 45 g 0   CONCERTA 18 MG CR tablet   0   Desoximetasone 0.25 % LIQD Topicort 0.25 % topical spray     dimenhyDRINATE 50 MG CHEW Chew by mouth.     divalproex (DEPAKOTE ER) 500 MG 24 hr tablet 1 tablet     divalproex (DEPAKOTE) 500 MG DR tablet 500 mg 2 (two) times daily.   1   divalproex (DEPAKOTE) 500 MG DR tablet TAKE 1 TABLET BY MOUTH TWICE DAILY 90 tablet 1   divalproex (DEPAKOTE) 500 MG DR tablet TAKE 1 TABLET BY MOUTH TWICE DAILY 90 tablet 1   docusate sodium (COLACE) 100 MG capsule Take by mouth.      doxycycline (VIBRAMYCIN) 100 MG capsule TAKE 1 CAPSULE BY MOUTH TWICE DAILY  FOR 7 DAYS 14 capsule 0   fluticasone (FLONASE) 50 MCG/ACT nasal spray Place into the nose.     furosemide (LASIX) 20 MG tablet TAKE 1 TABLET BY MOUTH ONCE DAILY AS NEEDED FOR EDEMA FOR 3 DAYS AND THEN AS NEEDED ONLY 30 tablet 0   hydrOXYzine (VISTARIL) 25 MG capsule      ibuprofen (ADVIL,MOTRIN) 600 MG tablet Take 600 mg by mouth once.  0   ketoconazole (NIZORAL) 2 % shampoo   6   ketoconazole (NIZORAL) 2 % shampoo WASH SCALP THREE TIMES A WEEK. LEAVE IT IN PLACE FOR 30 MINUTES BEFORE RINSING. 120 mL 0   Lactobacillus Rhamnosus, GG, (CULTURELLE) CAPS Take by mouth.     levothyroxine (SYNTHROID) 50 MCG tablet 1 tablet in the morning on an empty stomach     levothyroxine (SYNTHROID, LEVOTHROID) 25 MCG tablet Take 50 mcg by mouth.      loratadine (CLARITIN) 10 MG tablet Take by mouth.     Loratadine 10 MG CAPS Take by mouth.     memantine (NAMENDA) 5 MG tablet   3   memantine (NAMENDA) 5 MG tablet Take by mouth.     metFORMIN (GLUCOPHAGE-XR) 500 MG 24 hr tablet 2 tablets with evening meal     methocarbamol (ROBAXIN) 750 MG tablet methocarbamol 750 mg tablet 90 tablet 3   norethindrone-ethinyl estradiol (LOESTRIN) 1-20 MG-MCG tablet TAKE 1 TABLET BY MOUTH DAILY. 63 tablet 6   nystatin cream (MYCOSTATIN) Apply 1 application topically 2 (two) times daily.     OLANZapine (ZYPREXA) 2.5 MG tablet Take 2.5 mg by mouth at bedtime.     ondansetron (ZOFRAN) 4 MG tablet Take 4 mg by mouth every 8 (eight) hours as needed for nausea or vomiting.     oxymetazoline (AFRIN NASAL SPRAY) 0.05 % nasal spray Afrin (oxymetazoline) 0.05 % nasal spray  ADMINISTER 1 SPRAY INTO BOTH NOSTRILS TWO TIMES DAILY FOR 3 DAYS     predniSONE (DELTASONE) 20 MG tablet TAKE 2 TABLES BY MOUTH TWICE A DAY FOR 2 DAYS, THEN TAKE 2 TABLETS BY MOUTH ONCE A DAY FOR 8 MORE DAYS 24 tablet 0   Probiotic Product (TRUBIOTICS PO) Take by mouth.     pseudoephedrine  (SUDAFED) 60 MG tablet Take 60 mg by mouth every 4 (four) hours as needed for congestion.     rizatriptan (MAXALT) 10 MG tablet TAKE 1 TABLET BY MOUTH AS NEEDED. REPEAT IN 2 HOURS IF NEEDED. 9 tablet 5   saccharomyces boulardii (FLORASTOR) 250 MG capsule Take by mouth.     SYNTHROID 50 MCG tablet TAKE 1 TABLET BY MOUTH IN THE MORNING ON AN EMPTY STOMACH 30 tablet 0   SYNTHROID 50 MCG tablet TAKE 1 TABLET BY MOUTH IN THE MORNING ON AN EMPTY STOMACH 30 30 tablet 5   traZODone (DESYREL) 50 MG tablet TAKE 1 TABLET BY MOUTH AT BEDTIME AS NEEDED 90 tablet 0   traZODone (DESYREL) 50 MG tablet TAKE 1 TABLET BY MOUTH ONCE DAILY AT BEDTIME AS NEEDED 90 tablet 0   triamcinolone (NASACORT) 55 MCG/ACT AERO nasal inhaler Place into the nose.     triamcinolone (NASACORT) 55 MCG/ACT AERO nasal inhaler 1 puff in each nostril     triamcinolone cream (KENALOG) 0.1 % Apply topically.     No current facility-administered medications for this visit.      REVIEW OF SYSTEMS (Negative unless checked)  Constitutional: _0 Weight loss  _1 Fever  _2 Chills Cardiac: _3 Chest pain   _4 Chest pressure   _5 Palpitations   _6   Shortness of breath when laying flat   _0 Shortness of breath at rest   _1 Shortness of breath with exertion. Vascular:  _2 Pain in legs with walking   _3 Pain in legs at rest   _4 Pain in legs when laying flat   _5 Claudication   _6 Pain in feet when walking  _7 Pain in feet at rest  _8 Pain in feet when laying flat   _9 History of DVT   _10 Phlebitis   _11 Swelling in legs   _12 Varicose veins   _13 Non-healing ulcers Pulmonary:   _14 Uses home oxygen   _15 Productive cough   _16 Hemoptysis   _17 Wheeze  _18 COPD   _19 Asthma Neurologic:  _20 Dizziness  _21 Blackouts   _22 Seizures   _23 History of stroke   _24 History of TIA  _25 Aphasia   _26 Temporary blindness   _27 Dysphagia   _28 Weakness or numbness in arms   _29 Weakness or numbness in legs Musculoskeletal:  _30 Arthritis   _31 Joint swelling   _32 Joint pain   _33 Low back pain Hematologic:  _34 Easy  bruising  _35 Easy bleeding   _36 Hypercoagulable state   _37 Anemic  _38 Hepatitis Gastrointestinal:  _39 Blood in stool   _40 Vomiting blood  _41 Gastroesophageal reflux/heartburn   _42 Abdominal pain Genitourinary:  _43 Chronic kidney disease   _44 Difficult urination  _45 Frequent urination  _46 Burning with urination   _47 Hematuria Skin:  _48 Rashes   _49 Ulcers   _50 Wounds Psychological:  _51 History of anxiety   _52  History of major depression.    Physical Exam BP 120/77   Pulse 72   Ht _53  (1.575 m)   Wt 201 lb (91.2 kg)   BMI 36.76 kg/m  Gen:  WD/WN, NAD Head: [place]/AT, No temporalis wasting.  Ear/Nose/Throat: Hearing grossly intact, dentition good Eyes: Sclera non-icteric. Conjunctiva clear Neck: Supple. Trachea midline Pulmonary:  Good air movement, no use of accessory muscles, respirations not labored.  Cardiac: RRR, No JVD Vascular: Varicosities scattered and measuring up to 1-2 mm in the right lower extremity        Varicosities scattered and measuring up to 1-2 mm in the left lower extremity Vessel Right Left  Radial Palpable Palpable                          PT Palpable Palpable  DP Palpable Palpable   Gastrointestinal: soft, non-tender/non-distended.  Musculoskeletal: M/S 5/5 throughout.   1 + RLE edema.  1 + LLE edema Neurologic: Sensation grossly intact in extremities.  Symmetrical.  Speech is fluent.  Psychiatric: Judgment intact, Mood & affect appropriate for pt's clinical situation. Dermatologic: No rashes or ulcers noted.  No cellulitis or open wounds.    Radiology MR BRAIN W WO CONTRAST  Result Date: 12/27/2020 CLINICAL DATA:  Cognitive impairment.  Neuro quant protocol. EXAM: MRI HEAD WITHOUT AND WITH CONTRAST TECHNIQUE: Multiplanar, multiecho pulse sequences of the brain and surrounding structures were obtained without and with intravenous contrast. CONTRAST:  55m GADAVIST GADOBUTROL 1 MMOL/ML IV SOLN COMPARISON:  MR head without and with contrast 10/08/2019 FINDINGS: Brain: No  acute infarct, hemorrhage, or mass lesion is present. Scattered subcortical T2 hyperintensities are present bilaterally, moderately advanced for age. Mild generalized atrophy is also present. A cavum septum pellucidum is noted. No acute infarct, hemorrhage, or mass lesion is present. Asymmetric right-sided white matter changes are present in the pons. Brainstem and cerebellum are otherwise within normal limits. The internal auditory canals are within normal limits. Postcontrast images demonstrate no pathologic enhancement. Vascular: Flow is present in the major intracranial arteries. Skull and upper cervical spine: The craniocervical junction is  normal. Upper cervical spine is within normal limits. Marrow signal is unremarkable. Sinuses/Orbits: Bilateral maxillary antrostomies and partial ethmoidectomies noted. Minimal mucosal thickening is present without significant residual or recurrent sinus disease. No fluid levels are present. The globes and orbits are within normal limits. Other: NeuroQuant mapping is appropriate. The measured quantitative areas are symmetric right to left. Whole brain volume is at the eleventh percentile for age. The superolateral ventricles are at the ninety-fifth percentile for age. Cortical gray matter is at the fifth percentile for age. The inferolateral ventricles are at the ninetieth percentile for age. White matter hypo intensities are at the seventy-sixth percentile for age. IMPRESSION: 1. Quantitative atrophy of the brain for age with cortical gray matter in the fifth percentile and cerebral white matter in the thirty-sixth percentile. 2. Increased size of the lateral ventricles. 3. Increased cerebral white matter hypo intensities. These are nonspecific, but likely reflect the sequela of chronic microvascular ischemia. 4. No acute intracranial abnormality. Electronically Signed   By: San Morelle M.D.   On: 12/27/2020 15:58    Labs No results found for this or any  previous visit (from the past 2160 hour(s)).  Assessment/Plan:  Swelling of limb I have had a long discussion with the patient regarding swelling and why it  causes symptoms.  Patient will begin wearing graduated compression stockings class 1 (20-30 mmHg) on a daily basis a prescription was given. The patient will  beginning wearing the stockings first thing in the morning and removing them in the evening. The patient is instructed specifically not to sleep in the stockings.   In addition, behavioral modification will be initiated.  This will include frequent elevation, use of over the counter pain medications and exercise such as walking.  I have reviewed systemic causes for chronic edema such as liver, kidney and cardiac etiologies.  The patient denies problems with these organ systems.    Consideration for a lymph pump will also be made based upon the effectiveness of conservative therapy.  This would help to improve the edema control and prevent sequela such as ulcers and infections   Patient should undergo duplex ultrasound of the venous system to ensure that DVT or reflux is not present.  The patient will follow-up with me after the ultrasound.    HTN (hypertension) blood pressure control important in reducing the progression of atherosclerotic disease. On appropriate oral medications.   Prediabetes blood glucose control important in reducing the progression of atherosclerotic disease. Also, involved in wound healing. On appropriate medications.       Leotis Pain 01/02/2021, 1:32 PM   This note was created with Dragon medical transcription system.  Any errors from dictation are unintentional.

## 2021-01-02 NOTE — Assessment & Plan Note (Signed)
blood glucose control important in reducing the progression of atherosclerotic disease. Also, involved in wound healing. On appropriate medications.  

## 2021-01-02 NOTE — Assessment & Plan Note (Signed)

## 2021-01-16 ENCOUNTER — Ambulatory Visit (INDEPENDENT_AMBULATORY_CARE_PROVIDER_SITE_OTHER): Payer: Managed Care, Other (non HMO)

## 2021-01-16 ENCOUNTER — Encounter (INDEPENDENT_AMBULATORY_CARE_PROVIDER_SITE_OTHER): Payer: Self-pay | Admitting: Nurse Practitioner

## 2021-01-16 ENCOUNTER — Other Ambulatory Visit: Payer: Self-pay

## 2021-01-16 ENCOUNTER — Ambulatory Visit (INDEPENDENT_AMBULATORY_CARE_PROVIDER_SITE_OTHER): Payer: Managed Care, Other (non HMO) | Admitting: Nurse Practitioner

## 2021-01-16 VITALS — BP 123/54 | HR 86 | Resp 16 | Wt 199.0 lb

## 2021-01-16 DIAGNOSIS — R600 Localized edema: Secondary | ICD-10-CM | POA: Diagnosis not present

## 2021-01-16 DIAGNOSIS — M7989 Other specified soft tissue disorders: Secondary | ICD-10-CM

## 2021-01-16 DIAGNOSIS — I8312 Varicose veins of left lower extremity with inflammation: Secondary | ICD-10-CM

## 2021-01-16 DIAGNOSIS — I10 Essential (primary) hypertension: Secondary | ICD-10-CM | POA: Diagnosis not present

## 2021-01-16 DIAGNOSIS — E7849 Other hyperlipidemia: Secondary | ICD-10-CM

## 2021-01-17 ENCOUNTER — Encounter (INDEPENDENT_AMBULATORY_CARE_PROVIDER_SITE_OTHER): Payer: Self-pay | Admitting: Nurse Practitioner

## 2021-01-17 NOTE — Progress Notes (Signed)
Subjective:    Patient ID: Mandy Torres, female    DOB: [birth year], 50 y.o.   MRN: [id number] Chief Complaint  Patient presents with  . Follow-up    Ultrasound follow up    Mandy Torres is a 50 year old female who presents today for evaluation of lower extremity edema.  The patient had COVID in January and notes that since that time she has been experiencing heaviness and swelling in both of her legs.  She notes that the left leg always swells slightly worse but the heaviness is the same bilaterally.  The patient notes that due to COVID and her activity level is greatly reduced and she is just now starting to return to baseline.  She denies any DVT or superficial thrombophlebitis.  She has not worn medical grade compression stockings previously.  Today noninvasive studies show no evidence of DVT or superficial thrombophlebitis bilaterally.  No evidence of deep venous insufficiency seen bilaterally.  No evidence of superficial venous reflux seen in the right lower extremity.  The left does have reflux in the great saphenous vein at the proximal thigh extending to the distal thigh.   Review of Systems  Cardiovascular:  Positive for leg swelling.  All other systems reviewed and are negative.     Objective:   Physical Exam Vitals reviewed.  HENT:     Head: Normocephalic.  Cardiovascular:     Rate and Rhythm: Normal rate.     Pulses: Normal pulses.  Pulmonary:     Effort: Pulmonary effort is normal.  Skin:    General: Skin is warm and dry.  Neurological:     Mental Status: She is alert and oriented to person, place, and time.  Psychiatric:        Mood and Affect: Mood normal.        Behavior: Behavior normal.        Thought Content: Thought content normal.        Judgment: Judgment normal.    BP (!) 123/54 (BP Location: Right Arm)   Pulse 86   Resp 16   Wt 199 lb (90.3 kg)   BMI 36.40 kg/m   Past Medical History:  Diagnosis Date  . ADHD (attention deficit  hyperactivity disorder)    Mandy Torres  . Allergy   . Anemia   . BRCA negative 05/27/2016   variant of uncertain significance found in BRCA2  . Complication of anesthesia   . Depression   . DJD (degenerative joint disease), cervical    neurosurg - Dr. Wendall Stade at [facility]  . Foot neuroma   . GERD (gastroesophageal reflux disease)   . Hiatal hernia   . History of breast biopsy 05/2001  . Hx MRSA infection    X 2-LAST HAD IN 2015  . Hypertension   . Hypothyroidism   . Migraine   . PONV (postoperative nausea and vomiting)   . Thyroid disease 2017  . TMJ disease     Social History   Socioeconomic History  . Marital status: Married    Spouse name: Not on file  . Number of children: Not on file  . Years of education: Not on file  . Highest education level: Not on file  Occupational History  . Not on file  Tobacco Use  . Smoking status: Never  . Smokeless tobacco: Never  Vaping Use  . Vaping Use: Never used  Substance and Sexual Activity  . Alcohol use: No  . Drug use: No  . Sexual activity:  Yes    Birth control/protection: Pill  Other Topics Concern  . Not on file  Social History Narrative   Single   0 children   0 pregnancies    First menstrual cycle: 50 yrs old         Social Determinants of Radio broadcast assistant Strain: Not on file  Food Insecurity: Not on file  Transportation Needs: Not on file  Physical Activity: Not on file  Stress: Not on file  Social Connections: Not on file  Intimate Partner Violence: Not on file    Past Surgical History:  Procedure Laterality Date  . [place] STUDY N/A 12/31/2015   Procedure: [place] STUDY;  Surgeon: Lucilla Lame, MD;  Location: [facility] ENDOSCOPY;  Service: Endoscopy;  Laterality: N/A;  . APPENDECTOMY    . BREAST SURGERY     normal biopsy 2002, left  . CHOLECYSTECTOMY    . ESOPHAGEAL MANOMETRY N/A 12/17/2015   Procedure: ESOPHAGEAL MANOMETRY (EM);  Surgeon: Lucilla Lame, MD;  Location: [facility] ENDOSCOPY;   Service: Endoscopy;  Laterality: N/A;  . FACIAL RECONSTRUCTION SURGERY  2/88, 9/88, 2/89  . HIATAL HERNIA REPAIR  02/03/2016   Arletta Bale, M.D.  . KNEE SURGERY  11/97 and 4/93   right  . METATARSAL OSTEOTOMY WITH BUNIONECTOMY  2016  . NASAL SINUS SURGERY  2006 and 1998   deviated septum, ethmoids, Dr. Caryn Section then Richardson Landry  . OOPHORECTOMY Right   . Right and Left Foot Surgery  06/2016    Family History  Problem Relation Age of Onset  . Cancer Mother 76       Breast  . Diabetes Mother   . Thyroid disease Mother   . Cancer Sister 15       Half sister, Breast  . Cancer Maternal Aunt        Breast  . Diabetes Maternal Aunt   . Thyroid disease Maternal Aunt   . Heart disease Maternal Grandmother   . Pancreatic cancer Maternal Grandfather     Allergies  Allergen Reactions  . Diazepam Other (See Comments)    Other Reaction: Other reaction Hyperactivity  Hyperactivity  Other Reaction: Other reaction  . Oxycodone-Acetaminophen Nausea Only and Nausea And Vomiting    Also Endocet Also Endocet  . Penicillins Hives and Nausea Only  . Amphetamine-Dextroamphetamine Other (See Comments)    Hyperactivity, "extra happy"  . Avelox [Moxifloxacin Hcl In Nacl] Hives  . Cheese     FETA CHEESE ONLY-MIGRAINES  . Endocet [Oxycodone-Acetaminophen] Nausea Only  . Epitol [Carbamazepine] Hives    Severe hives and swelling  . Levofloxacin Nausea And Vomiting  . Levofloxacin Nausea Only  . Moxifloxacin Hives  . Niacin And Related     ONLY IF LARGE AMOUNTS IN FOOD ONLY-MIGRAINES  . Percocet [Oxycodone-Acetaminophen] Nausea And Vomiting    Also Endocet  . Ranitidine Nausea And Vomiting  . Sulfa Antibiotics Hives  . Topamax     congnitive impairmant  . Topiramate Other (See Comments)    Cognitive impairment   . Valium     Hyperactivity     CBC Latest Ref Rng & Units 05/04/2019 01/25/2018 11/12/2016  WBC - 6.6 5.7 7.4  Hemoglobin 12.0 - 16.0 14.1 13.6 13.3  Hematocrit 36 - 46 42 39.2  40.8  Platelets 150 - 399 290 320.0 342      CMP     Component Value Date/Time   NA 139 05/04/2019 0000   K 4.6 05/04/2019 0000   CL  97 02/01/2018 1135   CO2 29 02/01/2018 1135   GLUCOSE 84 02/01/2018 1135   BUN 9 05/04/2019 0000   CREATININE 0.9 05/04/2019 0000   CREATININE 0.72 02/01/2018 1135   CREATININE 0.68 11/21/2015 1134   CALCIUM 8.4 02/01/2018 1135   PROT 6.0 01/25/2018 1048   PROT 6.4 11/12/2016 1209   ALBUMIN 3.8 01/25/2018 1048   ALBUMIN 4.0 11/12/2016 1209   AST 15 05/04/2019 0000   ALT 14 05/04/2019 0000   ALKPHOS 53 05/04/2019 0000   BILITOT 0.3 01/25/2018 1048   BILITOT <0.2 11/12/2016 1209   GFRNONAA 71 11/12/2016 1209   GFRAA 81 11/12/2016 1209     No results found.     Assessment & Plan:   1. Varicose veins of left lower extremity with inflammation   Recommend:  The patient has large symptomatic varicose veins that are painful and associated with swelling.  I have had a long discussion with the patient regarding  varicose veins and why they cause symptoms.  Patient will begin wearing graduated compression stockings class 1 on a daily basis, beginning first thing in the morning and removing them in the evening. The patient is instructed specifically not to sleep in the stockings.    The patient  will also begin using over-the-counter analgesics such as Motrin 600 mg po TID to help control the symptoms.    In addition, behavioral modification including elevation during the day will be initiated.    Pending the results of these changes the  patient will be reevaluated in three months.   '  Further plans will be based on the ultrasound results and whether conservative therapies are successful at eliminating the pain and swelling.   2. Primary hypertension Continue antihypertensive medications as already ordered, these medications have been reviewed and there are no changes at this time.   3. Hyperlipidemia, familial, high LDL Continue statin as  ordered and reviewed, no changes at this time    Current Outpatient Medications on File Prior to Visit  Medication Sig Dispense Refill  . albuterol (PROVENTIL) (2.5 MG/3ML) 0.083% nebulizer solution TAKE 3 MLS BY NEBULIZATION EVERY 6 HOURS AS NEEDED FOR WHEEZING 75 mL 0  . Amantadine HCl 100 MG tablet Take by mouth.    . ARIPiprazole (ABILIFY) 5 MG tablet TAKE 1 TABLET BY MOUTH DAILY 90 tablet 1  . ARIPiprazole (ABILIFY) 5 MG tablet TAKE 1 TABLET BY MOUTH DAILY 90 tablet 1  . azelastine (ASTELIN) 0.1 % nasal spray   12  . Biotin 1000 MCG tablet Take by mouth.    . budesonide (PULMICORT) 1 MG/2ML nebulizer solution INHALE THE CONTENTS OF 1 VIAL IN NEBULIZER 2 TIMES DAILY 60 mL 0  . busPIRone (BUSPAR) 10 MG tablet   0  . cetirizine (ZYRTEC) 10 MG tablet Take by mouth.    . chlorhexidine (PERIDEX) 0.12 % solution   0  . citalopram (CELEXA) 20 MG tablet TAKE 1 TABLET BY MOUTH ONCE DAILY 90 tablet 1  . citalopram (CELEXA) 40 MG tablet Take 40 mg by mouth daily.    . citalopram (CELEXA) 40 MG tablet TAKE 1 TABLET BY MOUTH DAILY WITH BREAKFAST 90 tablet 0  . citalopram (CELEXA) 40 MG tablet TAKE 1 TABLET BY MOUTH ONCE DAILY WITH BREAKFAST 90 tablet 0  . clobetasol (TEMOVATE) 0.05 % external solution clobetasol 0.05 % scalp solution    . clobetasol ointment (TEMOVATE) 9.50 % APPLY ONE APPLICATION TOPICAL 2 TIMES A DAY 45 g 0  . CONCERTA  18 MG CR tablet   0  . Desoximetasone 0.25 % LIQD Topicort 0.25 % topical spray    . dimenhyDRINATE 50 MG CHEW Chew by mouth.    . divalproex (DEPAKOTE ER) 500 MG 24 hr tablet 1 tablet    . divalproex (DEPAKOTE) 500 MG DR tablet 500 mg 2 (two) times daily.   1  . divalproex (DEPAKOTE) 500 MG DR tablet TAKE 1 TABLET BY MOUTH TWICE DAILY 90 tablet 1  . divalproex (DEPAKOTE) 500 MG DR tablet TAKE 1 TABLET BY MOUTH TWICE DAILY 90 tablet 1  . docusate sodium (COLACE) 100 MG capsule Take by mouth.    . fluticasone (FLONASE) 50 MCG/ACT nasal spray Place into the nose.     . furosemide (LASIX) 20 MG tablet TAKE 1 TABLET BY MOUTH ONCE DAILY AS NEEDED FOR EDEMA FOR 3 DAYS AND THEN AS NEEDED ONLY 30 tablet 0  . hydrOXYzine (VISTARIL) 25 MG capsule     . ibuprofen (ADVIL,MOTRIN) 600 MG tablet Take 600 mg by mouth once.  0  . ketoconazole (NIZORAL) 2 % shampoo   6  . ketoconazole (NIZORAL) 2 % shampoo WASH SCALP THREE TIMES A WEEK. LEAVE IT IN PLACE FOR 30 MINUTES BEFORE RINSING. 120 mL 0  . Lactobacillus Rhamnosus, GG, (CULTURELLE) CAPS Take by mouth.    . levothyroxine (SYNTHROID) 50 MCG tablet 1 tablet in the morning on an empty stomach    . levothyroxine (SYNTHROID, LEVOTHROID) 25 MCG tablet Take 50 mcg by mouth.     . loratadine (CLARITIN) 10 MG tablet Take by mouth.    . Loratadine 10 MG CAPS Take by mouth.    . memantine (NAMENDA) 5 MG tablet   3  . memantine (NAMENDA) 5 MG tablet Take by mouth.    . metFORMIN (GLUCOPHAGE-XR) 500 MG 24 hr tablet 2 tablets with evening meal    . methocarbamol (ROBAXIN) 750 MG tablet methocarbamol 750 mg tablet 90 tablet 3  . norethindrone-ethinyl estradiol (LOESTRIN) 1-20 MG-MCG tablet TAKE 1 TABLET BY MOUTH DAILY. 63 tablet 6  . nystatin cream (MYCOSTATIN) Apply 1 application topically 2 (two) times daily.    Marland Kitchen OLANZapine (ZYPREXA) 2.5 MG tablet Take 2.5 mg by mouth at bedtime.    . ondansetron (ZOFRAN) 4 MG tablet Take 4 mg by mouth every 8 (eight) hours as needed for nausea or vomiting.    Marland Kitchen oxymetazoline (AFRIN NASAL SPRAY) 0.05 % nasal spray Afrin (oxymetazoline) 0.05 % nasal spray  ADMINISTER 1 SPRAY INTO BOTH NOSTRILS TWO TIMES DAILY FOR 3 DAYS    . predniSONE (DELTASONE) 20 MG tablet TAKE 2 TABLES BY MOUTH TWICE A DAY FOR 2 DAYS, THEN TAKE 2 TABLETS BY MOUTH ONCE A DAY FOR 8 MORE DAYS 24 tablet 0  . Probiotic Product (TRUBIOTICS PO) Take by mouth.    . pseudoephedrine (SUDAFED) 60 MG tablet Take 60 mg by mouth every 4 (four) hours as needed for congestion.    . rizatriptan (MAXALT) 10 MG tablet TAKE 1 TABLET BY MOUTH  AS NEEDED. REPEAT IN 2 HOURS IF NEEDED. 9 tablet 5  . saccharomyces boulardii (FLORASTOR) 250 MG capsule Take by mouth.    . SYNTHROID 50 MCG tablet TAKE 1 TABLET BY MOUTH IN THE MORNING ON AN EMPTY STOMACH 30 tablet 0  . SYNTHROID 50 MCG tablet TAKE 1 TABLET BY MOUTH IN THE MORNING ON AN EMPTY STOMACH 30 30 tablet 5  . traZODone (DESYREL) 50 MG tablet TAKE 1 TABLET BY MOUTH AT BEDTIME  AS NEEDED 90 tablet 0  . traZODone (DESYREL) 50 MG tablet TAKE 1 TABLET BY MOUTH ONCE DAILY AT BEDTIME AS NEEDED 90 tablet 0  . triamcinolone (NASACORT) 55 MCG/ACT AERO nasal inhaler Place into the nose.    . triamcinolone (NASACORT) 55 MCG/ACT AERO nasal inhaler 1 puff in each nostril    . triamcinolone cream (KENALOG) 0.1 % Apply topically.    Marland Kitchen azithromycin (ZITHROMAX) 250 MG tablet TAKE 2 TABLETS BY MOUTH ON DAY 1. TAKE 1 TABLET BY MOUTH ON DAYS 2-5. (Patient not taking: No sig reported) 6 tablet 0  . clindamycin (CLEOCIN) 300 MG capsule clindamycin HCl 300 mg capsule (Patient not taking: No sig reported)    . doxycycline (VIBRAMYCIN) 100 MG capsule TAKE 1 CAPSULE BY MOUTH TWICE DAILY FOR 7 DAYS (Patient not taking: No sig reported) 14 capsule 0   No current facility-administered medications on file prior to visit.    There are no Patient Instructions on file for this visit. No follow-ups on file.   Kris Hartmann, NP

## 2021-02-27 ENCOUNTER — Other Ambulatory Visit: Payer: Self-pay

## 2021-02-27 MED ORDER — LEVOTHYROXINE SODIUM 50 MCG PO TABS
ORAL_TABLET | ORAL | 4 refills | Status: AC
  Filled 2021-02-27: qty 90, 90d supply, fill #0

## 2021-02-27 MED ORDER — METFORMIN HCL ER 500 MG PO TB24
ORAL_TABLET | ORAL | 4 refills | Status: AC
  Filled 2021-02-27: qty 180, 90d supply, fill #0

## 2021-03-10 ENCOUNTER — Other Ambulatory Visit: Payer: Self-pay

## 2021-03-18 DIAGNOSIS — M25519 Pain in unspecified shoulder: Secondary | ICD-10-CM | POA: Insufficient documentation

## 2021-03-25 ENCOUNTER — Ambulatory Visit (INDEPENDENT_AMBULATORY_CARE_PROVIDER_SITE_OTHER): Payer: Managed Care, Other (non HMO) | Admitting: Advanced Practice Midwife

## 2021-03-25 ENCOUNTER — Other Ambulatory Visit (HOSPITAL_COMMUNITY)
Admission: RE | Admit: 2021-03-25 | Discharge: 2021-03-25 | Disposition: A | Discharge status: Home / Self Care | Payer: Managed Care, Other (non HMO) | Source: Ambulatory Visit | Attending: Advanced Practice Midwife | Admitting: Advanced Practice Midwife

## 2021-03-25 ENCOUNTER — Other Ambulatory Visit: Payer: Self-pay

## 2021-03-25 ENCOUNTER — Encounter: Payer: Self-pay | Admitting: Advanced Practice Midwife

## 2021-03-25 VITALS — BP 114/80 | HR 91 | Ht 62.0 in | Wt 196.0 lb

## 2021-03-25 DIAGNOSIS — Z01419 Encounter for gynecological examination (general) (routine) without abnormal findings: Secondary | ICD-10-CM | POA: Insufficient documentation

## 2021-03-25 DIAGNOSIS — Z3041 Encounter for surveillance of contraceptive pills: Secondary | ICD-10-CM | POA: Diagnosis not present

## 2021-03-25 MED ORDER — NORETHINDRONE ACET-ETHINYL EST 1-20 MG-MCG PO TABS: 1.0000 | ORAL_TABLET | Freq: Every day | ORAL | 6 refills | Status: AC

## 2021-03-25 NOTE — Progress Notes (Signed)
Would like to start back on birth control   Mammo 02/2021

## 2021-03-26 NOTE — Progress Notes (Signed)
GYNECOLOGY ANNUAL PREVENTATIVE CARE ENCOUNTER NOTE  History:     Mandy Torres is a 50 y.o. G0P0000 female here for a routine annual gynecologic exam.  Current complaints: None.   Denies abnormal vaginal bleeding, discharge, pelvic pain, problems with intercourse or other gynecologic concerns.    Gynecologic History No LMP recorded. (Menstrual status: Irregular Periods). Contraception:  perimenopausal Last Pap: 2020. Result was normal with negative HPV Last Mammogram: August 2022 [facility]).  Result was normal Last Colonoscopy: July 2022.  Result was normal  Obstetric History OB History  Gravida Para Term Preterm AB Living  0 0 0 0 0 0  SAB IAB Ectopic Multiple Live Births  0 0 0 0 0    Past Medical History:  Diagnosis Date   ADHD (attention deficit hyperactivity disorder)    [employer], broadcasting/film/video   Allergy    Anemia    BRCA negative 05/27/2016   variant of uncertain significance found in BRCA2   Complication of anesthesia    Depression    DJD (degenerative joint disease), cervical    neurosurg - Dr. Wendall Stade at [place] [facility]    GERD (gastroesophageal reflux disease)    Hiatal hernia    History of breast biopsy 05/2001   Hx MRSA infection    X 2-LAST HAD IN 2015   Hypertension    Hypothyroidism    Migraine    PONV (postoperative nausea and vomiting)    Thyroid disease 2017   TMJ disease     Past Surgical History:  Procedure Laterality Date   81 HOUR [place] STUDY N/A 12/31/2015   Procedure: 24 HOUR [place] STUDY;  Surgeon: Lucilla Lame, MD;  Location: [facility] ENDOSCOPY;  Service: Endoscopy;  Laterality: N/A;   APPENDECTOMY     BREAST SURGERY     normal biopsy 2002, left   CHOLECYSTECTOMY     ESOPHAGEAL MANOMETRY N/A 12/17/2015   Procedure: ESOPHAGEAL MANOMETRY (EM);  Surgeon: Lucilla Lame, MD;  Location: [facility] ENDOSCOPY;  Service: Endoscopy;  Laterality: N/A;   FACIAL RECONSTRUCTION SURGERY  2/88, 9/88, 2/89   HIATAL HERNIA REPAIR  02/03/2016   Arletta Bale, M.D.   KNEE  SURGERY  11/97 and 4/93   right   METATARSAL OSTEOTOMY WITH BUNIONECTOMY  2016   NASAL SINUS SURGERY  2006 and 1998   deviated septum, ethmoids, Dr. Caryn Section then Richardson Landry   OOPHORECTOMY Right    Right and Left Foot Surgery  06/2016    Current Outpatient Medications on File Prior to Visit  Medication Sig Dispense Refill   albuterol (PROVENTIL) (2.5 MG/3ML) 0.083% nebulizer solution TAKE 3 MLS BY NEBULIZATION EVERY 6 HOURS AS NEEDED FOR WHEEZING 75 mL 0   ARIPiprazole (ABILIFY) 5 MG tablet TAKE 1 TABLET BY MOUTH DAILY 90 tablet 1   azelastine (ASTELIN) 0.1 % nasal spray   12   cetirizine (ZYRTEC) 10 MG tablet Take by mouth.     citalopram (CELEXA) 40 MG tablet TAKE 1 TABLET BY MOUTH ONCE DAILY WITH BREAKFAST 90 tablet 0   divalproex (DEPAKOTE) 500 MG DR tablet TAKE 1 TABLET BY MOUTH TWICE DAILY 90 tablet 1   levothyroxine (SYNTHROID) 50 MCG tablet take 1 tablet by mouth in the morning on an empty stomach 90 tablet 4   memantine (NAMENDA) 5 MG tablet   3   metFORMIN (GLUCOPHAGE-XR) 500 MG 24 hr tablet 2 tablets with evening meal     methocarbamol (ROBAXIN) 750 MG tablet methocarbamol 750 mg tablet 90 tablet 3  Amantadine HCl 100 MG tablet Take by mouth.     ARIPiprazole (ABILIFY) 5 MG tablet TAKE 1 TABLET BY MOUTH DAILY 90 tablet 1   chlorhexidine (PERIDEX) 0.12 % solution   0   citalopram (CELEXA) 20 MG tablet TAKE 1 TABLET BY MOUTH ONCE DAILY 90 tablet 1   divalproex (DEPAKOTE ER) 500 MG 24 hr tablet 1 tablet     divalproex (DEPAKOTE) 500 MG DR tablet 500 mg 2 (two) times daily.   1   divalproex (DEPAKOTE) 500 MG DR tablet TAKE 1 TABLET BY MOUTH TWICE DAILY 90 tablet 1   docusate sodium (COLACE) 100 MG capsule Take by mouth.     doxycycline (VIBRAMYCIN) 100 MG capsule TAKE 1 CAPSULE BY MOUTH TWICE DAILY FOR 7 DAYS (Patient not taking: No sig reported) 14 capsule 0   fluticasone (FLONASE) 50 MCG/ACT nasal spray Place into the nose.     furosemide (LASIX) 20 MG tablet TAKE 1 TABLET BY  MOUTH ONCE DAILY AS NEEDED FOR EDEMA FOR 3 DAYS AND THEN AS NEEDED ONLY (Patient not taking: Reported on 03/25/2021) 30 tablet 0   hydrOXYzine (VISTARIL) 25 MG capsule  (Patient not taking: Reported on 03/25/2021)     ibuprofen (ADVIL,MOTRIN) 600 MG tablet Take 600 mg by mouth once. (Patient not taking: Reported on 03/25/2021)  0   ketoconazole (NIZORAL) 2 % shampoo  (Patient not taking: Reported on 03/25/2021)  6   ketoconazole (NIZORAL) 2 % shampoo WASH SCALP THREE TIMES A WEEK. LEAVE IT IN PLACE FOR 30 MINUTES BEFORE RINSING. (Patient not taking: Reported on 03/25/2021) 120 mL 0   Lactobacillus Rhamnosus, GG, (CULTURELLE) CAPS Take by mouth. (Patient not taking: Reported on 03/25/2021)     levothyroxine (SYNTHROID) 50 MCG tablet 1 tablet in the morning on an empty stomach     levothyroxine (SYNTHROID, LEVOTHROID) 25 MCG tablet Take 50 mcg by mouth.  (Patient not taking: Reported on 03/25/2021)     loratadine (CLARITIN) 10 MG tablet Take by mouth. (Patient not taking: Reported on 03/25/2021)     Loratadine 10 MG CAPS Take by mouth. (Patient not taking: Reported on 03/25/2021)     memantine (NAMENDA) 5 MG tablet Take by mouth.     metFORMIN (GLUCOPHAGE-XR) 500 MG 24 hr tablet take 2 tablets by mouth with evening meal 180 tablet 4   nystatin cream (MYCOSTATIN) Apply 1 application topically 2 (two) times daily.     OLANZapine (ZYPREXA) 2.5 MG tablet Take 2.5 mg by mouth at bedtime.     ondansetron (ZOFRAN) 4 MG tablet Take 4 mg by mouth every 8 (eight) hours as needed for nausea or vomiting.     oxymetazoline (AFRIN NASAL SPRAY) 0.05 % nasal spray Afrin (oxymetazoline) 0.05 % nasal spray  ADMINISTER 1 SPRAY INTO BOTH NOSTRILS TWO TIMES DAILY FOR 3 DAYS     predniSONE (DELTASONE) 20 MG tablet TAKE 2 TABLES BY MOUTH TWICE A DAY FOR 2 DAYS, THEN TAKE 2 TABLETS BY MOUTH ONCE A DAY FOR 8 MORE DAYS 24 tablet 0   Probiotic Product (TRUBIOTICS PO) Take by mouth.     pseudoephedrine (SUDAFED) 60 MG tablet Take 60 mg  by mouth every 4 (four) hours as needed for congestion.     rizatriptan (MAXALT) 10 MG tablet TAKE 1 TABLET BY MOUTH AS NEEDED. REPEAT IN 2 HOURS IF NEEDED. 9 tablet 5   saccharomyces boulardii (FLORASTOR) 250 MG capsule Take by mouth.     SYNTHROID 50 MCG tablet TAKE 1 TABLET BY  MOUTH IN THE MORNING ON AN EMPTY STOMACH 30 tablet 0   SYNTHROID 50 MCG tablet TAKE 1 TABLET BY MOUTH IN THE MORNING ON AN EMPTY STOMACH 30 30 tablet 5   traZODone (DESYREL) 50 MG tablet TAKE 1 TABLET BY MOUTH AT BEDTIME AS NEEDED 90 tablet 0   traZODone (DESYREL) 50 MG tablet TAKE 1 TABLET BY MOUTH ONCE DAILY AT BEDTIME AS NEEDED 90 tablet 0   triamcinolone (NASACORT) 55 MCG/ACT AERO nasal inhaler Place into the nose.     triamcinolone (NASACORT) 55 MCG/ACT AERO nasal inhaler 1 puff in each nostril     triamcinolone cream (KENALOG) 0.1 % Apply topically.     No current facility-administered medications on file prior to visit.    Allergies  Allergen Reactions   Diazepam Other (See Comments)    Other Reaction: Other reaction Hyperactivity  Hyperactivity  Other Reaction: Other reaction   Oxycodone-Acetaminophen Nausea Only and Nausea And Vomiting    Also Endocet Also Endocet   Penicillins Hives and Nausea Only   Amphetamine-Dextroamphetamine Other (See Comments)    Hyperactivity, "extra happy"   Avelox [Moxifloxacin Hcl In Nacl] Hives   Cheese     FETA CHEESE ONLY-MIGRAINES   Endocet [Oxycodone-Acetaminophen] Nausea Only   Epitol [Carbamazepine] Hives    Severe hives and swelling   Levofloxacin Nausea And Vomiting   Levofloxacin Nausea Only   Moxifloxacin Hives   Niacin And Related     ONLY IF LARGE AMOUNTS IN FOOD ONLY-MIGRAINES   Percocet [Oxycodone-Acetaminophen] Nausea And Vomiting    Also Endocet   Ranitidine Nausea And Vomiting   Sulfa Antibiotics Hives   Topamax     congnitive impairmant   Topiramate Other (See Comments)    Cognitive impairment    Valium     Hyperactivity     Social  History:  reports that she has never smoked. She has never used smokeless tobacco. She reports that she does not drink alcohol and does not use drugs.  Family History  Problem Relation Age of Onset   Cancer Mother 69       Breast   Diabetes Mother    Thyroid disease Mother    Cancer Sister 4       Half sister, Breast   Cancer Maternal Aunt        Breast   Diabetes Maternal Aunt    Thyroid disease Maternal Aunt    Heart disease Maternal Grandmother    Pancreatic cancer Maternal Grandfather     The following portions of the patient's history were reviewed and updated as appropriate: allergies, current medications, past family history, past medical history, past social history, past surgical history and problem list.  Review of Systems Pertinent items noted in HPI and remainder of comprehensive ROS otherwise negative.  Physical Exam:  BP 114/80   Pulse 91   Ht 5' 2"  (1.575 m)   Wt 196 lb (88.9 kg)   BMI 35.85 kg/m  CONSTITUTIONAL: Well-developed, well-nourished female in no acute distress.  HENT:  Normocephalic, atraumatic, External right and left ear normal.  EYES: Conjunctivae and EOM are normal. Pupils are equal, round, and reactive to light. No scleral icterus.  NECK: Normal range of motion, supple, no masses.  Normal thyroid.  SKIN: Skin is warm and dry. No rash noted. Not diaphoretic. No erythema. No pallor. MUSCULOSKELETAL: Normal range of motion. No tenderness.  No cyanosis, clubbing, or edema. NEUROLOGIC: Alert and oriented to person, place, and time. Normal reflexes, muscle tone coordination.  PSYCHIATRIC: Normal mood and affect. Normal behavior. Normal judgment and thought content. CARDIOVASCULAR: Normal heart rate noted, regular rhythm RESPIRATORY: Clear to auscultation bilaterally. Effort and breath sounds normal, no problems with respiration noted. BREASTS: Symmetric in size. No masses, tenderness, skin changes, nipple drainage, or lymphadenopathy bilaterally.  Performed in the presence of a chaperone. ABDOMEN: Soft, no distention noted.  No tenderness, rebound or guarding.  PELVIC: Normal appearing external genitalia and urethral meatus; normal appearing vaginal mucosa and cervix.  No abnormal vaginal discharge noted.  Pap smear obtained.  Performed in the presence of a chaperone.   Assessment and Plan:    1. Well woman exam with routine gynecological exam - No concerning findings on physical exam - Cytology - PAP  2. Encounter for surveillance of contraceptive pills - 3 months on, 3 months off - norethindrone-ethinyl estradiol (LOESTRIN) 1-20 MG-MCG tablet; Take 1 tablet by mouth daily.  Dispense: 63 tablet; Refill: 6  Will follow up results of pap smear and manage accordingly. Mammogram performed last month Colon cancer screening is up to date (managed by PCP) Routine preventative health maintenance measures emphasized. Please refer to After Visit Summary for other counseling recommendations.      Mallie Snooks, MSN, CNM Certified Nurse Midwife, Product/process development scientist for [employer], [place]

## 2021-04-03 LAB — CYTOLOGY - PAP
Comment: NEGATIVE
Comment: NEGATIVE
Diagnosis: UNDETERMINED — AB
HPV 16: NEGATIVE
HPV 18 / 45: NEGATIVE
High risk HPV: POSITIVE — AB

## 2021-04-04 ENCOUNTER — Telehealth: Payer: Self-pay | Admitting: Advanced Practice Midwife

## 2021-04-04 NOTE — Telephone Encounter (Signed)
VLTCB regarding pap results  Mallie Snooks, MSN, CNM Certified Nurse Midwife, [facility] for [employer], [place] 04/04/21 11:11 AM

## 2021-04-04 NOTE — Telephone Encounter (Signed)
Patient returned my call at 1310. Identity confirmed x 2. Reviewed pap results, ASCCP recommendation for repeat pap + HPV in one year. Reassurance attempted. Discussed that I had coordinated next steps with Dr. Ilda Basset. Pt denies questions at end of call.  Mallie Snooks, MSN, CNM Certified Nurse Midwife, [employer] for [employer], [employer] 04/04/21 1:17 PM

## 2021-04-12 ENCOUNTER — Other Ambulatory Visit: Payer: Self-pay

## 2021-04-12 ENCOUNTER — Emergency Department
Admission: EM | Admit: 2021-04-12 | Discharge: 2021-04-12 | Disposition: A | Discharge status: Home / Self Care | Payer: Managed Care, Other (non HMO) | Attending: Emergency Medicine | Admitting: Emergency Medicine

## 2021-04-12 ENCOUNTER — Emergency Department: Payer: Managed Care, Other (non HMO)

## 2021-04-12 DIAGNOSIS — R519 Headache, unspecified: Secondary | ICD-10-CM | POA: Diagnosis present

## 2021-04-12 DIAGNOSIS — Z79899 Other long term (current) drug therapy: Secondary | ICD-10-CM | POA: Diagnosis not present

## 2021-04-12 DIAGNOSIS — T783XXA Angioneurotic edema, initial encounter: Secondary | ICD-10-CM | POA: Insufficient documentation

## 2021-04-12 DIAGNOSIS — E039 Hypothyroidism, unspecified: Secondary | ICD-10-CM | POA: Insufficient documentation

## 2021-04-12 DIAGNOSIS — I10 Essential (primary) hypertension: Secondary | ICD-10-CM | POA: Diagnosis not present

## 2021-04-12 LAB — BASIC METABOLIC PANEL
Anion gap: 15 (ref 5–15)
BUN: 11 mg/dL (ref 6–20)
CO2: 27 mmol/L (ref 22–32)
Calcium: 9.2 mg/dL (ref 8.9–10.3)
Chloride: 91 mmol/L — ABNORMAL LOW (ref 98–111)
Creatinine, Ser: 0.87 mg/dL (ref 0.44–1.00)
GFR, Estimated: >60 mL/min (ref >60)
Glucose, Bld: 104 mg/dL — ABNORMAL HIGH (ref 70–99)
Potassium: 4.3 mmol/L (ref 3.5–5.1)
Sodium: 133 mmol/L — ABNORMAL LOW (ref 135–145)

## 2021-04-12 LAB — CBC WITH DIFFERENTIAL/PLATELET
Abs Immature Granulocytes: 0.05 10*3/uL (ref 0.00–0.07)
Basophils Absolute: 0 10*3/uL (ref 0.0–0.1)
Basophils Relative: 0 %
Eosinophils Absolute: 0 10*3/uL (ref 0.0–0.5)
Eosinophils Relative: 0 %
HCT: 38 % (ref 36.0–46.0)
Hemoglobin: 13.5 g/dL (ref 12.0–15.0)
Immature Granulocytes: 1 %
Lymphocytes Relative: 8 %
Lymphs Abs: 0.9 10*3/uL (ref 0.7–4.0)
MCH: 34 pg (ref 26.0–34.0)
MCHC: 35.5 g/dL (ref 30.0–36.0)
MCV: 95.7 fL (ref 80.0–100.0)
Monocytes Absolute: 0.7 10*3/uL (ref 0.1–1.0)
Monocytes Relative: 6 %
Neutro Abs: 9.5 10*3/uL — ABNORMAL HIGH (ref 1.7–7.7)
Neutrophils Relative %: 85 %
Platelets: 250 10*3/uL (ref 150–400)
RBC: 3.97 MIL/uL (ref 3.87–5.11)
RDW: 12.1 % (ref 11.5–15.5)
WBC: 11.1 10*3/uL — ABNORMAL HIGH (ref 4.0–10.5)
nRBC: 0 % (ref 0.0–0.2)

## 2021-04-12 MED ORDER — DEXAMETHASONE SODIUM PHOSPHATE 10 MG/ML IJ SOLN
10.0000 mg | Freq: Once | INTRAMUSCULAR | Status: AC
  Administered 2021-04-12: 10 mg via INTRAVENOUS
  Filled 2021-04-12: qty 1

## 2021-04-12 MED ORDER — IOHEXOL 350 MG/ML SOLN
60.0000 mL | Freq: Once | INTRAVENOUS | Status: AC | PRN
  Administered 2021-04-12: 60 mL via INTRAVENOUS
  Filled 2021-04-12: qty 60

## 2021-04-12 NOTE — Discharge Instructions (Signed)
Continue with your previously prescribed steroids. Follow-up with your primary provider or return as needed.

## 2021-04-12 NOTE — ED Provider Notes (Signed)
[facility] Emergency Department Provider Note ____________________________________________  Time seen: 1246  I have reviewed the triage vital signs and the nursing notes.  HISTORY  Chief Complaint  Facial Swelling   HPI Mandy Torres is a 50 y.o. female presents to the ED with right-sided facial swelling.  Patient notes onset on Friday but denies any new medicines, no exposure to triggers.  She denies any difficulty breathing, swallowing, or controlling oral secretions.  Patient was diagnosed with COVID on Thursday, and was prescribed steroids as well as Paxlovid.  She took the steroid taper as prescribed, but did not start the Paxlovid, citing concern over reported side effects.  She did however, start with a old prescription of Omnicef which she had for prior sinus infection.  She would note that this was the last time she experienced sudden facial swelling.  Patient denies any frank fevers, chills, sweats, chest pain, or shortness of breath.  He also denies any redness or swelling, or itching to the face or lips.  Past Medical History:  Diagnosis Date   ADHD (attention deficit hyperactivity disorder)    [employer], broadcasting/film/video   Allergy    Anemia    BRCA negative 05/27/2016   variant of uncertain significance found in BRCA2   Complication of anesthesia    Depression    DJD (degenerative joint disease), cervical    neurosurg - Dr. Wendall Stade at [facility]    GERD (gastroesophageal reflux disease)    Hiatal hernia    History of breast biopsy 05/2001   Hx MRSA infection    X 2-LAST HAD IN 2015   Hypertension    Hypothyroidism    Migraine    PONV (postoperative nausea and vomiting)    Thyroid disease 2017   TMJ disease     Patient Active Problem List   Diagnosis Date Noted   Swelling of limb 01/02/2021   Concern about neurological disease without diagnosis 06/04/2019   Mood disorder ([place]) 06/04/2019   Anal skin tag 04/14/2017   Encounter for  screening colonoscopy 04/14/2017   Allergic rhinitis 02/14/2017   Cervical paraspinal muscle spasm 02/14/2017   Family history of breast cancer 11/12/2016   Esophageal reflux    Fatigue 11/21/2014   Enlarged lymph node 04/12/2014   Hypothyroidism 04/02/2014   Prediabetes 01/25/2014   Morbid obesity ([place]) 01/25/2014   Abnormality of rectum 12/31/2011   Hyperlipidemia, familial, high LDL 06/23/2011   ADHD (attention deficit hyperactivity disorder)    Depression    Migraine    HTN (hypertension) 04/03/2011    Past Surgical History:  Procedure Laterality Date   24 HOUR [place] STUDY N/A 12/31/2015   Procedure: 24 HOUR PH STUDY;  Surgeon: Lucilla Lame, MD;  Location: [facility] ENDOSCOPY;  Service: Endoscopy;  Laterality: N/A;   APPENDECTOMY     BREAST SURGERY     normal biopsy 2002, left   CHOLECYSTECTOMY     ESOPHAGEAL MANOMETRY N/A 12/17/2015   Procedure: ESOPHAGEAL MANOMETRY (EM);  Surgeon: Lucilla Lame, MD;  Location: [facility] ENDOSCOPY;  Service: Endoscopy;  Laterality: N/A;   FACIAL RECONSTRUCTION SURGERY  2/88, 9/88, 2/89   HIATAL HERNIA REPAIR  02/03/2016   Arletta Bale, M.D.   KNEE SURGERY  11/97 and 4/93   right   METATARSAL OSTEOTOMY WITH BUNIONECTOMY  2016   NASAL SINUS SURGERY  2006 and 1998   deviated septum, ethmoids, Dr. Caryn Section then Richardson Landry   OOPHORECTOMY Right    Right and Left Foot Surgery  06/2016    Prior to Admission medications   Medication Sig Start Date End Date Taking? Authorizing Provider  albuterol (PROVENTIL) (2.5 MG/3ML) 0.083% nebulizer solution TAKE 3 MLS BY NEBULIZATION EVERY 6 HOURS AS NEEDED FOR WHEEZING 07/29/20 07/29/21  Gladstone Lighter, MD  Amantadine HCl 100 MG tablet Take by mouth. 12/10/20 03/10/21  [provider]  ARIPiprazole (ABILIFY) 5 MG tablet TAKE 1 TABLET BY MOUTH DAILY 09/19/20 09/19/21  Chauncey Mann, MD  ARIPiprazole (ABILIFY) 5 MG tablet TAKE 1 TABLET BY MOUTH DAILY 05/09/20 05/09/21  Chauncey Mann, MD  azelastine (ASTELIN) 0.1 %  nasal spray  05/05/18   [provider]  cetirizine (ZYRTEC) 10 MG tablet Take by mouth.    [provider]  chlorhexidine (PERIDEX) 0.12 % solution  02/03/18   [provider]  citalopram (CELEXA) 20 MG tablet TAKE 1 TABLET BY MOUTH ONCE DAILY 02/05/20 02/04/21  Chauncey Mann, MD  citalopram (CELEXA) 40 MG tablet TAKE 1 TABLET BY MOUTH ONCE DAILY WITH BREAKFAST 06/27/20 06/27/21  Chauncey Mann, MD  divalproex (DEPAKOTE ER) 500 MG 24 hr tablet 1 tablet    [provider]  divalproex (DEPAKOTE) 500 MG DR tablet 500 mg 2 (two) times daily.  04/17/18   [provider]  divalproex (DEPAKOTE) 500 MG DR tablet TAKE 1 TABLET BY MOUTH TWICE DAILY 09/19/20 09/19/21  Chauncey Mann, MD  divalproex (DEPAKOTE) 500 MG DR tablet TAKE 1 TABLET BY MOUTH TWICE DAILY 05/09/20 05/09/21  Chauncey Mann, MD  docusate sodium (COLACE) 100 MG capsule Take by mouth.    [provider]  fluticasone (FLONASE) 50 MCG/ACT nasal spray Place into the nose.    [provider]  Lactobacillus Rhamnosus, GG, (CULTURELLE) CAPS Take by mouth. Patient not taking: Reported on 03/25/2021    [provider]  levothyroxine (SYNTHROID) 50 MCG tablet 1 tablet in the morning on an empty stomach    [provider]  levothyroxine (SYNTHROID) 50 MCG tablet take 1 tablet by mouth in the morning on an empty stomach 02/27/21     levothyroxine (SYNTHROID, LEVOTHROID) 25 MCG tablet Take 50 mcg by mouth.  Patient not taking: Reported on 03/25/2021    [provider]  loratadine (CLARITIN) 10 MG tablet Take by mouth. Patient not taking: Reported on 03/25/2021    [provider]  memantine [facility]) 5 MG tablet  05/26/18   [provider]  memantine (NAMENDA) 5 MG tablet Take by mouth. 12/05/20 03/05/21  [provider]  metFORMIN (GLUCOPHAGE-XR) 500 MG 24 hr tablet 2 tablets with evening meal 08/29/20   [provider]  metFORMIN  (GLUCOPHAGE-XR) 500 MG 24 hr tablet take 2 tablets by mouth with evening meal 02/27/21     methocarbamol (ROBAXIN) 750 MG tablet methocarbamol 750 mg tablet 06/04/19   Lucille Passy, MD  norethindrone-ethinyl estradiol (LOESTRIN) 1-20 MG-MCG tablet Take 1 tablet by mouth daily. 03/25/21   Darlina Rumpf, CNM  nystatin cream (MYCOSTATIN) Apply 1 application topically 2 (two) times daily. 08/08/20   [provider]  OLANZapine (ZYPREXA) 2.5 MG tablet Take 2.5 mg by mouth at bedtime.    [provider]  ondansetron (ZOFRAN) 4 MG tablet Take 4 mg by mouth every 8 (eight) hours as needed for nausea or vomiting.    [provider]  oxymetazoline (AFRIN NASAL SPRAY) 0.05 % nasal spray Afrin (oxymetazoline) 0.05 % nasal spray  ADMINISTER 1 SPRAY INTO BOTH NOSTRILS TWO TIMES DAILY FOR  3 DAYS    [provider]  predniSONE (DELTASONE) 20 MG tablet TAKE 2 TABLES BY MOUTH TWICE A DAY FOR 2 DAYS, THEN TAKE 2 TABLETS BY MOUTH ONCE A DAY FOR 8 MORE DAYS 07/25/20 07/25/21  Gladstone Lighter, MD  Probiotic Product (TRUBIOTICS PO) Take by mouth.    [provider]  pseudoephedrine (SUDAFED) 60 MG tablet Take 60 mg by mouth every 4 (four) hours as needed for congestion.    [provider]  rizatriptan (MAXALT) 10 MG tablet TAKE 1 TABLET BY MOUTH AS NEEDED. REPEAT IN 2 HOURS IF NEEDED. 07/27/18   Lucille Passy, MD  saccharomyces boulardii (FLORASTOR) 250 MG capsule Take by mouth.    [provider]  SYNTHROID 50 MCG tablet TAKE 1 TABLET BY MOUTH IN THE MORNING ON AN EMPTY STOMACH 08/13/20 08/13/21  Delrae Rend, MD  SYNTHROID 50 MCG tablet TAKE 1 TABLET BY MOUTH IN THE MORNING ON AN EMPTY STOMACH 30 02/01/20 01/31/21  Delrae Rend, MD  traZODone (DESYREL) 50 MG tablet TAKE 1 TABLET BY MOUTH AT BEDTIME AS NEEDED 09/19/20 09/19/21  Chauncey Mann, MD  traZODone (DESYREL) 50 MG tablet TAKE 1 TABLET BY MOUTH ONCE DAILY AT BEDTIME AS NEEDED 06/27/20 06/27/21  Chauncey Mann, MD  triamcinolone (NASACORT) 55 MCG/ACT AERO nasal inhaler Place into the nose.    [provider]  triamcinolone (NASACORT) 55 MCG/ACT AERO nasal inhaler 1 puff in each nostril    [provider]  triamcinolone cream (KENALOG) 0.1 % Apply topically. 08/08/20   [provider]    Allergies Diazepam, Oxycodone-acetaminophen, Penicillins, Amphetamine-dextroamphetamine, Avelox [moxifloxacin hcl in nacl], Cheese, Endocet [oxycodone-acetaminophen], Epitol [carbamazepine], Levofloxacin, Levofloxacin, Moxifloxacin, Niacin and related, Percocet [oxycodone-acetaminophen], Ranitidine, Sulfa antibiotics, Topamax, Topiramate, and Valium  Family History  Problem Relation Age of Onset   Cancer Mother 27       Breast   Diabetes Mother    Thyroid disease Mother    Cancer Sister 31       Half sister, Breast   Cancer Maternal Aunt        Breast   Diabetes Maternal Aunt    Thyroid disease Maternal Aunt    Heart disease Maternal Grandmother    Pancreatic cancer Maternal Grandfather     Social History Social History   Tobacco Use   Smoking status: Never   Smokeless tobacco: Never  Vaping Use   Vaping Use: Never used  Substance Use Topics   Alcohol use: No   Drug use: No    Review of Systems  Constitutional: Negative for fever. Eyes: Negative for visual changes. ENT: Negative for sore throat. Cardiovascular: Negative for chest pain. Respiratory: Negative for shortness of breath. Gastrointestinal: Negative for abdominal pain, vomiting and diarrhea. Genitourinary: Negative for dysuria. Musculoskeletal: Negative for back pain. Skin: Negative for rash.  Right facial swelling as above. Neurological: Negative for headaches, focal weakness or numbness. ____________________________________________  PHYSICAL EXAM:  VITAL SIGNS: ED Triage Vitals [04/12/21 1109]  Enc Vitals Group     BP 132/81     Pulse Rate 93     Resp 18     Temp 98 F (36.7 C)      Temp src      SpO2 100 %     Weight      Height      Head Circumference      Peak Flow      Pain Score 5     Pain Loc  Pain Edu?      Excl. in [place]?     Constitutional: Alert and oriented. Well appearing and in no distress. Head: Normocephalic and atraumatic, except for subtle soft tissue swelling to the right nasolabial fold, the right side of the upper lip, and some extension into the lower lip and chin.  Eyes: Conjunctivae are normal. Normal extraocular movements Ears: Canals clear. TMs intact bilaterally. Nose: No congestion/rhinorrhea/epistaxis. Mouth/Throat: Mucous membranes are moist.  Uvula is midline and tonsils are flat.  No oropharyngeal lesions are appreciated.  No focal gum swelling or abscesses appreciated. Neck: Supple. No thyromegaly. Hematological/Lymphatic/Immunological: No cervical lymphadenopathy. Cardiovascular: Normal rate, regular rhythm. Normal distal pulses. Respiratory: Normal respiratory effort. No wheezes/rales/rhonchi. Gastrointestinal: Soft and nontender. No distention. Musculoskeletal: Nontender with normal range of motion in all extremities.  Neurologic:  Normal gait without ataxia. Normal speech and language. No gross focal neurologic deficits are appreciated. Skin:  Skin is warm, dry and intact. No rash noted. Psychiatric: Mood and affect are normal. Patient exhibits appropriate insight and judgment. ____________________________________________    {LABS (pertinent positives/negatives) Labs Reviewed  CBC WITH DIFFERENTIAL/PLATELET - Abnormal; Notable for the following components:      Result Value   WBC 11.1 (*)    Neutro Abs 9.5 (*)    All other components within normal limits  BASIC METABOLIC PANEL - Abnormal; Notable for the following components:   Sodium 133 (*)    Chloride 91 (*)    Glucose, Bld 104 (*)    All other components within normal limits   ____________________________________________  {EKG  ____________________________________________   RADIOLOGY Official radiology report(s): CT Maxillofacial W Contrast  Result Date: 04/12/2021 CLINICAL DATA:  Facial cellulitis. Sinus pressure and facial swelling. EXAM: CT MAXILLOFACIAL WITH CONTRAST TECHNIQUE: Multidetector CT imaging of the maxillofacial structures was performed with intravenous contrast. Multiplanar CT image reconstructions were also generated. CONTRAST:  40m OMNIPAQUE IOHEXOL 350 MG/ML SOLN COMPARISON:  Head MRI 12/26/2020.  Maxillofacial CT 01/25/2018. FINDINGS: Osseous: No acute fracture or destructive osseous process. Orbits: Unremarkable. Sinuses: Postoperative changes are again noted including bilateral maxillary antrostomies, uncinectomies, and ethmoidectomies. Mild mucosal thickening in the maxillary sinuses. No sinus fluid. Clear mastoid air cells. Soft tissues: Mild right-sided facial soft tissue swelling anterior to the maxilla and mandible. No fluid collection identified within limitations of dental streak artifact. Chronic asymmetric fatty replacement of the right parotid gland. Limited intracranial: Unremarkable. IMPRESSION: Right facial soft tissue swelling.  No abscess. Electronically Signed   By: ALogan BoresM.D.   On: 04/12/2021 14:30   ____________________________________________  PROCEDURES  Decadron 10 mg IVP  Procedures ____________________________________________   INITIAL IMPRESSION / ASSESSMENT AND PLAN / ED COURSE  As part of my medical decision making, I reviewed the following data [place] the [place], Radiograph reviewed NAD, and Notes from prior ED visits   DDX: angioedema, facial cellulitis, dental abscess  Patient ED evaluation of sudden facial swelling without airway compromise or anaphylaxis.  Reports the similar episode happened when she had an acute sinus infection.  She is evaluated for  complaints with labs and a CT imaging which not reveal any acute sinusitis or facial abscess.  Benign, soft tissue swelling is reported on CT.  Patient is reassured by the work-up at this time, and is inclined to continue with the steroids as prescribed.  She is encouraged follow with primary provider return to the ED if needed.  MASCENCION STEGNERwas evaluated in Emergency Department on 04/12/2021 for  the symptoms described in the history of present illness. She was evaluated in the context of the global COVID-19 pandemic, which necessitated consideration that the patient might be at risk for infection with the SARS-CoV-2 virus that causes COVID-19. Institutional protocols and algorithms that pertain to the evaluation of patients at risk for COVID-19 are in a state of rapid change based on information released by regulatory bodies including the CDC and federal and state organizations. These policies and algorithms were followed during the patient's care in the ED. ____________________________________________  FINAL CLINICAL IMPRESSION(S) / ED DIAGNOSES  Final diagnoses:  Angioedema, initial encounter      Melvenia Needles, PA-C 04/12/21 1557    Rada Hay, MD 04/13/21 [phone]

## 2021-04-12 NOTE — ED Provider Notes (Signed)
Emergency Medicine Provider Triage Evaluation Note  Mandy Torres , a 50 y.o. female  was evaluated in triage.  Pt complains of sinus pressure, facial swelling and cough.  Diagnosed with COVID last Thursday.  Went to [place] urgent care Friday received IM steroid injection and placed on prednisone, no improvement in symptoms  Review of Systems  Positive: Sinus pressure, facial swelling and cough Negative: Headache, runny nose, ear pain, sore throat, difficulty swallowing, drooling, difficulty breathing or shortness of breath  Physical Exam  There were no vitals taken for this visit. Gen:   Awake, no distress   ENT:  Facial swelling noted Resp:  Normal effort, no audible wheezing or stridor   Medical Decision Making  Medically screening exam initiated at 11:09 AM.  Appropriate orders placed.  Wende Mott was informed that the remainder of the evaluation will be completed by another provider, this initial triage assessment does not replace that evaluation, and the importance of remaining in the ED until their evaluation is complete.     Jearld Fenton, NP 04/12/21 1110    Lucrezia Starch, MD 04/12/21 1524

## 2021-04-12 NOTE — ED Triage Notes (Signed)
Pt comes with c/o right sided facial swelling.  Pt stats she noticed it on Friday. Pt dx with COVID on Thursday. Pt prescribed steroids and doesn't feel it is getting any better.

## 2021-04-12 NOTE — ED Notes (Signed)
Patient given discharge instructions, all questions answered. Patient in possession of all belongings, directed to the discharge area  

## 2021-04-24 ENCOUNTER — Ambulatory Visit (INDEPENDENT_AMBULATORY_CARE_PROVIDER_SITE_OTHER): Payer: Managed Care, Other (non HMO) | Admitting: Nurse Practitioner

## 2022-04-01 ENCOUNTER — Other Ambulatory Visit (HOSPITAL_COMMUNITY)
Admission: RE | Admit: 2022-04-01 | Discharge: 2022-04-01 | Disposition: A | Discharge status: Home / Self Care | Payer: Managed Care, Other (non HMO) | Source: Ambulatory Visit | Attending: Advanced Practice Midwife | Admitting: Advanced Practice Midwife

## 2022-04-01 ENCOUNTER — Encounter: Payer: Self-pay | Admitting: Advanced Practice Midwife

## 2022-04-01 ENCOUNTER — Ambulatory Visit (INDEPENDENT_AMBULATORY_CARE_PROVIDER_SITE_OTHER): Payer: Managed Care, Other (non HMO) | Admitting: Advanced Practice Midwife

## 2022-04-01 VITALS — BP 122/83 | HR 90 | Wt 203.0 lb

## 2022-04-01 DIAGNOSIS — Z113 Encounter for screening for infections with a predominantly sexual mode of transmission: Secondary | ICD-10-CM | POA: Diagnosis present

## 2022-04-01 DIAGNOSIS — Z8742 Personal history of other diseases of the female genital tract: Secondary | ICD-10-CM | POA: Diagnosis not present

## 2022-04-01 DIAGNOSIS — Z124 Encounter for screening for malignant neoplasm of cervix: Secondary | ICD-10-CM | POA: Insufficient documentation

## 2022-04-01 DIAGNOSIS — Z1231 Encounter for screening mammogram for malignant neoplasm of breast: Secondary | ICD-10-CM

## 2022-04-01 DIAGNOSIS — Z01419 Encounter for gynecological examination (general) (routine) without abnormal findings: Secondary | ICD-10-CM

## 2022-04-01 NOTE — Progress Notes (Signed)
Annual   Last Pap: 03/25/21 ASCUS +HPV No cycle x 1 yr.  Mammogram: 2022 WNL would like scheduled at [facility]. On Friday after 12 noon.   STD Screening: Desires   CC: Wants to discuss HPV last pap was abnormal.   Notes vaginal discharge and vaginal odor x 6 months now.   Pt states she uses white dial antibacterial soap.

## 2022-04-02 LAB — RPR: RPR Ser Ql: NONREACTIVE

## 2022-04-02 LAB — HEPATITIS C ANTIBODY: Hep C Virus Ab: NONREACTIVE

## 2022-04-02 LAB — HEPATITIS B SURFACE ANTIGEN: Hepatitis B Surface Ag: NEGATIVE

## 2022-04-02 LAB — HIV ANTIBODY (ROUTINE TESTING W REFLEX): HIV Screen 4th Generation wRfx: NONREACTIVE

## 2022-04-03 NOTE — Progress Notes (Signed)
GYNECOLOGY ANNUAL PREVENTATIVE CARE ENCOUNTER NOTE  History:     Mandy Torres is a 51 y.o. G0P0000 female here for a routine annual gynecologic exam.  Current complaints: six month history of abnormal vaginal discharge.  Patient reports personal cleansing with Dial soap. Denies abnormal vaginal bleeding, discharge, pelvic pain, problems with intercourse or other gynecologic concerns.    Gynecologic History No LMP recorded. (Menstrual status: Irregular Periods). Contraception: post menopausal status Last Pap: 03/25/2021. Result was abnormal ASCUS with positive HR HPV (negative 16, 18/45) Last Mammogram: 02/2021 [facility]).  Result was normal  Obstetric History OB History  Gravida Para Term Preterm AB Living  0 0 0 0 0 0  SAB IAB Ectopic Multiple Live Births  0 0 0 0 0    Past Medical History:  Diagnosis Date   ADHD (attention deficit hyperactivity disorder)    [employer], broadcasting/film/video   Allergy    Anemia    BRCA negative 05/27/2016   variant of uncertain significance found in BRCA2   Complication of anesthesia    Depression    DJD (degenerative joint disease), cervical    neurosurg - Dr. Wendall Stade at [place] [facility]    GERD (gastroesophageal reflux disease)    Hiatal hernia    History of breast biopsy 05/2001   Hx MRSA infection    X 2-LAST HAD IN 2015   Hypertension    Hypothyroidism    Migraine    PONV (postoperative nausea and vomiting)    Thyroid disease 2017   TMJ disease     Past Surgical History:  Procedure Laterality Date   65 HOUR [place] STUDY N/A 12/31/2015   Procedure: 24 HOUR PH STUDY;  Surgeon: Lucilla Lame, MD;  Location: [facility] ENDOSCOPY;  Service: Endoscopy;  Laterality: N/A;   APPENDECTOMY     BREAST SURGERY     normal biopsy 2002, left   CHOLECYSTECTOMY     ESOPHAGEAL MANOMETRY N/A 12/17/2015   Procedure: ESOPHAGEAL MANOMETRY (EM);  Surgeon: Lucilla Lame, MD;  Location: [facility] ENDOSCOPY;  Service: Endoscopy;  Laterality: N/A;   FACIAL RECONSTRUCTION SURGERY   2/88, 9/88, 2/89   HIATAL HERNIA REPAIR  02/03/2016   Arletta Bale, M.D.   KNEE SURGERY  11/97 and 4/93   right   METATARSAL OSTEOTOMY WITH BUNIONECTOMY  2016   NASAL SINUS SURGERY  2006 and 1998   deviated septum, ethmoids, Dr. Caryn Section then Richardson Landry   OOPHORECTOMY Right    Right and Left Foot Surgery  06/2016    Current Outpatient Medications on File Prior to Visit  Medication Sig Dispense Refill   Amantadine HCl 100 MG tablet Take by mouth.     chlorhexidine (PERIDEX) 0.12 % solution   0   divalproex (DEPAKOTE) 500 MG DR tablet 500 mg 2 (two) times daily.   1   fluticasone (FLONASE) 50 MCG/ACT nasal spray Place into the nose.     levothyroxine (SYNTHROID) 50 MCG tablet 1 tablet in the morning on an empty stomach     loratadine (CLARITIN) 10 MG tablet Take by mouth.     memantine (NAMENDA) 5 MG tablet   3   metFORMIN (GLUCOPHAGE-XR) 500 MG 24 hr tablet take 2 tablets by mouth with evening meal 180 tablet 4   methocarbamol (ROBAXIN) 750 MG tablet methocarbamol 750 mg tablet 90 tablet 3   ondansetron (ZOFRAN) 4 MG tablet Take 4 mg by mouth every 8 (eight) hours as needed for nausea or vomiting.     oxymetazoline (  AFRIN NASAL SPRAY) 0.05 % nasal spray Afrin (oxymetazoline) 0.05 % nasal spray  ADMINISTER 1 SPRAY INTO BOTH NOSTRILS TWO TIMES DAILY FOR 3 DAYS     pseudoephedrine (SUDAFED) 60 MG tablet Take 60 mg by mouth every 4 (four) hours as needed for congestion.     rizatriptan (MAXALT) 10 MG tablet TAKE 1 TABLET BY MOUTH AS NEEDED. REPEAT IN 2 HOURS IF NEEDED. 9 tablet 5   albuterol (PROVENTIL) (2.5 MG/3ML) 0.083% nebulizer solution TAKE 3 MLS BY NEBULIZATION EVERY 6 HOURS AS NEEDED FOR WHEEZING 75 mL 0   Amantadine HCl 100 MG tablet Take by mouth.     ARIPiprazole (ABILIFY) 5 MG tablet TAKE 1 TABLET BY MOUTH DAILY 90 tablet 1   ARIPiprazole (ABILIFY) 5 MG tablet TAKE 1 TABLET BY MOUTH DAILY 90 tablet 1   azelastine (ASTELIN) 0.1 % nasal spray  (Patient not taking: Reported on  04/01/2022)  12   cetirizine (ZYRTEC) 10 MG tablet Take by mouth. (Patient not taking: Reported on 04/01/2022)     citalopram (CELEXA) 20 MG tablet TAKE 1 TABLET BY MOUTH ONCE DAILY 90 tablet 1   citalopram (CELEXA) 40 MG tablet TAKE 1 TABLET BY MOUTH ONCE DAILY WITH BREAKFAST 90 tablet 0   divalproex (DEPAKOTE ER) 500 MG 24 hr tablet 1 tablet (Patient not taking: Reported on 04/01/2022)     divalproex (DEPAKOTE) 500 MG DR tablet TAKE 1 TABLET BY MOUTH TWICE DAILY 90 tablet 1   divalproex (DEPAKOTE) 500 MG DR tablet TAKE 1 TABLET BY MOUTH TWICE DAILY 90 tablet 1   docusate sodium (COLACE) 100 MG capsule Take by mouth. (Patient not taking: Reported on 04/01/2022)     Lactobacillus Rhamnosus, GG, (CULTURELLE) CAPS Take by mouth. (Patient not taking: Reported on 03/25/2021)     levothyroxine (SYNTHROID) 50 MCG tablet take 1 tablet by mouth in the morning on an empty stomach 90 tablet 4   levothyroxine (SYNTHROID, LEVOTHROID) 25 MCG tablet Take 50 mcg by mouth.  (Patient not taking: Reported on 03/25/2021)     memantine (NAMENDA) 5 MG tablet Take by mouth.     metFORMIN (GLUCOPHAGE-XR) 500 MG 24 hr tablet 2 tablets with evening meal     norethindrone-ethinyl estradiol (LOESTRIN) 1-20 MG-MCG tablet Take 1 tablet by mouth daily. (Patient not taking: Reported on 04/01/2022) 63 tablet 6   nystatin cream (MYCOSTATIN) Apply 1 application topically 2 (two) times daily. (Patient not taking: Reported on 04/01/2022)     OLANZapine (ZYPREXA) 2.5 MG tablet Take 2.5 mg by mouth at bedtime. (Patient not taking: Reported on 04/01/2022)     Probiotic Product (TRUBIOTICS PO) Take by mouth. (Patient not taking: Reported on 04/01/2022)     saccharomyces boulardii (FLORASTOR) 250 MG capsule Take by mouth. (Patient not taking: Reported on 04/01/2022)     SYNTHROID 50 MCG tablet TAKE 1 TABLET BY MOUTH IN THE MORNING ON AN EMPTY STOMACH 30 tablet 0   SYNTHROID 50 MCG tablet TAKE 1 TABLET BY MOUTH IN THE MORNING ON AN EMPTY STOMACH 30  30 tablet 5   traZODone (DESYREL) 50 MG tablet TAKE 1 TABLET BY MOUTH AT BEDTIME AS NEEDED 90 tablet 0   traZODone (DESYREL) 50 MG tablet TAKE 1 TABLET BY MOUTH ONCE DAILY AT BEDTIME AS NEEDED 90 tablet 0   triamcinolone (NASACORT) 55 MCG/ACT AERO nasal inhaler Place into the nose. (Patient not taking: Reported on 04/01/2022)     triamcinolone (NASACORT) 55 MCG/ACT AERO nasal inhaler 1 puff in each nostril (Patient  not taking: Reported on 04/01/2022)     triamcinolone cream (KENALOG) 0.1 % Apply topically. (Patient not taking: Reported on 04/01/2022)     No current facility-administered medications on file prior to visit.    Allergies  Allergen Reactions   Diazepam Other (See Comments)    Other Reaction: Other reaction Hyperactivity  Hyperactivity  Other Reaction: Other reaction   Oxycodone-Acetaminophen Nausea Only and Nausea And Vomiting    Also Endocet Also Endocet   Penicillins Hives and Nausea Only   Amphetamine-Dextroamphetamine Other (See Comments)    Hyperactivity, "extra happy"   Avelox [Moxifloxacin Hcl In Nacl] Hives   Cheese     FETA CHEESE ONLY-MIGRAINES   Endocet [Oxycodone-Acetaminophen] Nausea Only   Epitol [Carbamazepine] Hives    Severe hives and swelling   Levofloxacin Nausea And Vomiting   Levofloxacin Nausea Only   Moxifloxacin Hives   Niacin And Related     ONLY IF LARGE AMOUNTS IN FOOD ONLY-MIGRAINES   Percocet [Oxycodone-Acetaminophen] Nausea And Vomiting    Also Endocet   Ranitidine Nausea And Vomiting   Sulfa Antibiotics Hives   Topamax     congnitive impairmant   Topiramate Other (See Comments)    Cognitive impairment    Valium     Hyperactivity     Social History:  reports that she has never smoked. She has never used smokeless tobacco. She reports that she does not drink alcohol and does not use drugs.  Family History  Problem Relation Age of Onset   Cancer Mother 47       Breast   Diabetes Mother    Thyroid disease Mother    Cancer  Sister 68       Half sister, Breast   Cancer Maternal Aunt        Breast   Diabetes Maternal Aunt    Thyroid disease Maternal Aunt    Heart disease Maternal Grandmother    Pancreatic cancer Maternal Grandfather     The following portions of the patient's history were reviewed and updated as appropriate: allergies, current medications, past family history, past medical history, past social history, past surgical history and problem list.  Review of Systems Pertinent items noted in HPI and remainder of comprehensive ROS otherwise negative.  Physical Exam:  BP 122/83   Pulse 90   Wt 203 lb (92.1 kg)   BMI 37.13 kg/m  CONSTITUTIONAL: Well-developed, well-nourished female in no acute distress.  HENT:  Normocephalic, atraumatic, External right and left ear normal.  EYES: Conjunctivae and EOM are normal. Pupils are equal, round, and reactive to light. No scleral icterus.  SKIN: Skin is warm and dry. No rash noted. Not diaphoretic. No erythema. No pallor. MUSCULOSKELETAL: Normal range of motion. No tenderness.  No cyanosis, clubbing, or edema. NEUROLOGIC: Alert and oriented to person, place, and time. Normal reflexes, muscle tone coordination.  PSYCHIATRIC: Normal mood and affect. Normal behavior. Normal judgment and thought content. CARDIOVASCULAR: Normal heart rate noted, regular rhythm RESPIRATORY: Clear to auscultation bilaterally. Effort and breath sounds normal, no problems with respiration noted. BREASTS: Symmetric in size. No masses, tenderness, skin changes, nipple drainage, or lymphadenopathy bilaterally. Performed in the presence of a chaperone. ABDOMEN: Soft, no distention noted.  No tenderness, rebound or guarding.  PELVIC: Normal appearing external genitalia and urethral meatus; normal appearing vaginal mucosa and cervix.  Scant thin abnormal vaginal discharge noted.  Pap smear obtained.  Normal uterine size, no other palpable masses, no uterine or adnexal tenderness.  Performed  in the  presence of a chaperone.   Assessment and Plan:    1. Well woman exam with routine gynecological exam - No acute findings  2. Hx of abnormal cervical Pap smear   3. Screening for cervical cancer  - Cytology - PAP  4. Screening for STD (sexually transmitted disease) - Per patient request - Cervicovaginal ancillary only( [place]) - Hepatitis B surface antigen - Hepatitis C antibody - RPR - HIV Antibody (routine testing w rflx)  5. Encounter for screening mammogram for malignant neoplasm of breast  - MM 3D SCREEN BREAST BILATERAL; Future  Will follow up results of pap smear and manage accordingly. Mammogram scheduled Routine preventative health maintenance measures emphasized. Please refer to After Visit Summary for other counseling recommendations.      Mallie Snooks, [place], MSN, CNM Certified Nurse Midwife, Product/process development scientist for [employer], [place]

## 2022-04-04 LAB — CERVICOVAGINAL ANCILLARY ONLY
Bacterial Vaginitis (gardnerella): NEGATIVE
Candida Glabrata: NEGATIVE
Candida Vaginitis: NEGATIVE
Chlamydia: NEGATIVE
Comment: NEGATIVE
Comment: NEGATIVE
Comment: NEGATIVE
Comment: NEGATIVE
Comment: NEGATIVE
Comment: NORMAL
Neisseria Gonorrhea: NEGATIVE
Trichomonas: NEGATIVE

## 2022-04-06 LAB — CYTOLOGY - PAP
Comment: NEGATIVE
Diagnosis: NEGATIVE
Diagnosis: REACTIVE
High risk HPV: NEGATIVE

## 2022-05-10 ENCOUNTER — Encounter (INDEPENDENT_AMBULATORY_CARE_PROVIDER_SITE_OTHER): Payer: Self-pay

## 2022-05-13 ENCOUNTER — Other Ambulatory Visit (HOSPITAL_COMMUNITY): Payer: Self-pay

## 2022-10-20 ENCOUNTER — Encounter: Payer: Self-pay | Admitting: Podiatry

## 2022-10-20 ENCOUNTER — Ambulatory Visit: Payer: Managed Care, Other (non HMO) | Admitting: Podiatry

## 2022-10-20 ENCOUNTER — Ambulatory Visit (INDEPENDENT_AMBULATORY_CARE_PROVIDER_SITE_OTHER): Payer: Managed Care, Other (non HMO)

## 2022-10-20 DIAGNOSIS — M778 Other enthesopathies, not elsewhere classified: Secondary | ICD-10-CM

## 2022-10-20 DIAGNOSIS — G5782 Other specified mononeuropathies of left lower limb: Secondary | ICD-10-CM

## 2022-10-20 MED ORDER — TRIAMCINOLONE ACETONIDE 40 MG/ML IJ SUSP
20.0000 mg | Freq: Once | INTRAMUSCULAR | Status: AC
  Administered 2022-10-20: 20 mg

## 2022-10-20 NOTE — Progress Notes (Signed)
Subjective:  Patient ID: Mandy Torres, female    DOB: 14-Jan-1971,  MRN: [id number] HPI Chief Complaint  Patient presents with   Foot Pain    Plantar forefoot left - sub 4 - aching x 2 months, no injury, no treatment   New Patient (Initial Visit)    Est pt 2020    52 y.o. female presents with the above complaint.   ROS: Denies fever chills nausea vomiting muscle aches pains calf pain back pain chest pain shortness of breath.  Past Medical History:  Diagnosis Date   ADHD (attention deficit hyperactivity disorder)    Valinda HoarMeredith Baker   Allergy    Anemia    BRCA negative 05/27/2016   variant of uncertain significance found in BRCA2   Complication of anesthesia    Depression    DJD (degenerative joint disease), cervical    [facility]   Foot neuroma    GERD (gastroesophageal reflux disease)    Hiatal hernia    History of breast biopsy 05/2001   Hx MRSA infection    X 2-LAST HAD IN 2015   Hypertension    Hypothyroidism    Migraine    PONV (postoperative nausea and vomiting)    Thyroid disease 2017   TMJ disease    Past Surgical History:  Procedure Laterality Date   8724 HOUR PH STUDY N/A 12/31/2015   Procedure: 24 HOUR PH STUDY;  Surgeon: Midge Miniumarren Wohl, MD;  Location: [facility] ENDOSCOPY;  Service: Endoscopy;  Laterality: N/A;   APPENDECTOMY     BREAST SURGERY     normal biopsy 2002, left   CHOLECYSTECTOMY     ESOPHAGEAL MANOMETRY N/A 12/17/2015   Procedure: ESOPHAGEAL MANOMETRY (EM);  Surgeon: Midge Miniumarren Wohl, MD;  Location: [facility] ENDOSCOPY;  Service: Endoscopy;  Laterality: N/A;   FACIAL RECONSTRUCTION SURGERY  2/88, 9/88, 2/89   HIATAL HERNIA REPAIR  02/03/2016   Effie ShyMichael Tyner, M.D.   KNEE SURGERY  11/97 and 4/93   right   METATARSAL OSTEOTOMY WITH BUNIONECTOMY  2016   NASAL SINUS SURGERY  2006 and 1998   deviated septum, ethmoids, Dr. Sherrie MustacheFisher then Willeen CassBennett   OOPHORECTOMY Right    Right and Left Foot Surgery  06/2016    Current Outpatient Medications:     albuterol (PROVENTIL) (2.5 MG/3ML) 0.083% nebulizer solution, TAKE 3 MLS BY NEBULIZATION EVERY 6 HOURS AS NEEDED FOR WHEEZING, Disp: 75 mL, Rfl: 0   Amantadine HCl 100 MG tablet, Take by mouth., Disp: , Rfl:    Amantadine HCl 100 MG tablet, Take by mouth., Disp: , Rfl:    ARIPiprazole (ABILIFY) 5 MG tablet, TAKE 1 TABLET BY MOUTH DAILY, Disp: 90 tablet, Rfl: 1   ARIPiprazole (ABILIFY) 5 MG tablet, TAKE 1 TABLET BY MOUTH DAILY, Disp: 90 tablet, Rfl: 1   azelastine (ASTELIN) 0.1 % nasal spray, , Disp: , Rfl: 12   cetirizine (ZYRTEC) 10 MG tablet, Take by mouth. (Patient not taking: Reported on 04/01/2022), Disp: , Rfl:    chlorhexidine (PERIDEX) 0.12 % solution, , Disp: , Rfl: 0   citalopram (CELEXA) 20 MG tablet, TAKE 1 TABLET BY MOUTH ONCE DAILY, Disp: 90 tablet, Rfl: 1   citalopram (CELEXA) 40 MG tablet, TAKE 1 TABLET BY MOUTH ONCE DAILY WITH BREAKFAST, Disp: 90 tablet, Rfl: 0   divalproex (DEPAKOTE ER) 500 MG 24 hr tablet, 1 tablet (Patient not taking: Reported on 04/01/2022), Disp: , Rfl:    divalproex (DEPAKOTE) 500 MG DR tablet, 500 mg 2 (two) times  daily. , Disp: , Rfl: 1   divalproex (DEPAKOTE) 500 MG DR tablet, TAKE 1 TABLET BY MOUTH TWICE DAILY, Disp: 90 tablet, Rfl: 1   divalproex (DEPAKOTE) 500 MG DR tablet, TAKE 1 TABLET BY MOUTH TWICE DAILY, Disp: 90 tablet, Rfl: 1   docusate sodium (COLACE) 100 MG capsule, Take by mouth. (Patient not taking: Reported on 04/01/2022), Disp: , Rfl:    fluticasone (FLONASE) 50 MCG/ACT nasal spray, Place into the nose., Disp: , Rfl:    Lactobacillus Rhamnosus, GG, (CULTURELLE) CAPS, Take by mouth. (Patient not taking: Reported on 03/25/2021), Disp: , Rfl:    levothyroxine (SYNTHROID) 50 MCG tablet, 1 tablet in the morning on an empty stomach, Disp: , Rfl:    levothyroxine (SYNTHROID) 50 MCG tablet, take 1 tablet by mouth in the morning on an empty stomach, Disp: 90 tablet, Rfl: 4   levothyroxine (SYNTHROID, LEVOTHROID) 25 MCG tablet, Take 50 mcg by mouth.   (Patient not taking: Reported on 03/25/2021), Disp: , Rfl:    loratadine (CLARITIN) 10 MG tablet, Take by mouth., Disp: , Rfl:    memantine (NAMENDA) 5 MG tablet, , Disp: , Rfl: 3   memantine (NAMENDA) 5 MG tablet, Take by mouth., Disp: , Rfl:    metFORMIN (GLUCOPHAGE-XR) 500 MG 24 hr tablet, 2 tablets with evening meal, Disp: , Rfl:    metFORMIN (GLUCOPHAGE-XR) 500 MG 24 hr tablet, take 2 tablets by mouth with evening meal, Disp: 180 tablet, Rfl: 4   methocarbamol (ROBAXIN) 750 MG tablet, methocarbamol 750 mg tablet, Disp: 90 tablet, Rfl: 3   norethindrone-ethinyl estradiol (LOESTRIN) 1-20 MG-MCG tablet, Take 1 tablet by mouth daily. (Patient not taking: Reported on 04/01/2022), Disp: 63 tablet, Rfl: 6   nystatin cream (MYCOSTATIN), Apply 1 application topically 2 (two) times daily. (Patient not taking: Reported on 04/01/2022), Disp: , Rfl:    OLANZapine (ZYPREXA) 2.5 MG tablet, Take 2.5 mg by mouth at bedtime. (Patient not taking: Reported on 04/01/2022), Disp: , Rfl:    ondansetron (ZOFRAN) 4 MG tablet, Take 4 mg by mouth every 8 (eight) hours as needed for nausea or vomiting., Disp: , Rfl:    oxymetazoline (AFRIN NASAL SPRAY) 0.05 % nasal spray, Afrin (oxymetazoline) 0.05 % nasal spray  ADMINISTER 1 SPRAY INTO BOTH NOSTRILS TWO TIMES DAILY FOR 3 DAYS, Disp: , Rfl:    Probiotic Product (TRUBIOTICS PO), Take by mouth. (Patient not taking: Reported on 04/01/2022), Disp: , Rfl:    pseudoephedrine (SUDAFED) 60 MG tablet, Take 60 mg by mouth every 4 (four) hours as needed for congestion., Disp: , Rfl:    rizatriptan (MAXALT) 10 MG tablet, TAKE 1 TABLET BY MOUTH AS NEEDED. REPEAT IN 2 HOURS IF NEEDED., Disp: 9 tablet, Rfl: 5   saccharomyces boulardii (FLORASTOR) 250 MG capsule, Take by mouth. (Patient not taking: Reported on 04/01/2022), Disp: , Rfl:    SYNTHROID 50 MCG tablet, TAKE 1 TABLET BY MOUTH IN THE MORNING ON AN EMPTY STOMACH, Disp: 30 tablet, Rfl: 0   SYNTHROID 50 MCG tablet, TAKE 1 TABLET BY MOUTH  IN THE MORNING ON AN EMPTY STOMACH 30, Disp: 30 tablet, Rfl: 5   traZODone (DESYREL) 50 MG tablet, TAKE 1 TABLET BY MOUTH AT BEDTIME AS NEEDED, Disp: 90 tablet, Rfl: 0   traZODone (DESYREL) 50 MG tablet, TAKE 1 TABLET BY MOUTH ONCE DAILY AT BEDTIME AS NEEDED, Disp: 90 tablet, Rfl: 0   triamcinolone (NASACORT) 55 MCG/ACT AERO nasal inhaler, Place into the nose. (Patient not taking: Reported on 04/01/2022), Disp: ,  Rfl:    triamcinolone (NASACORT) 55 MCG/ACT AERO nasal inhaler, 1 puff in each nostril (Patient not taking: Reported on 04/01/2022), Disp: , Rfl:    triamcinolone cream (KENALOG) 0.1 %, Apply topically. (Patient not taking: Reported on 04/01/2022), Disp: , Rfl:    venlafaxine XR (EFFEXOR-XR) 75 MG 24 hr capsule, Take 75 mg by mouth every morning., Disp: , Rfl:   Allergies  Allergen Reactions   Diazepam Other (See Comments)    Other Reaction: Other reaction Hyperactivity  Hyperactivity  Other Reaction: Other reaction   Oxycodone-Acetaminophen Nausea Only and Nausea And Vomiting    Also Endocet Also Endocet   Penicillins Hives and Nausea Only   Amphetamine-Dextroamphetamine Other (See Comments)    Hyperactivity, "extra happy"   Avelox [Moxifloxacin Hcl In Nacl] Hives   Cheese     FETA CHEESE ONLY-MIGRAINES   Endocet [Oxycodone-Acetaminophen] Nausea Only   Epitol [Carbamazepine] Hives    Severe hives and swelling   Levofloxacin Nausea And Vomiting   Levofloxacin Nausea Only   Moxifloxacin Hives   Niacin And Related     ONLY IF LARGE AMOUNTS IN FOOD ONLY-MIGRAINES   Percocet [Oxycodone-Acetaminophen] Nausea And Vomiting    Also Endocet   Ranitidine Nausea And Vomiting   Sulfa Antibiotics Hives   Topamax     congnitive impairmant   Topiramate Other (See Comments)    Cognitive impairment    Valium     Hyperactivity    Review of Systems Objective:  There were no vitals filed for this visit.  General: Well developed, nourished, in no acute distress, alert and oriented x3    Dermatological: Skin is warm, dry and supple bilateral. Nails x 10 are well maintained; remaining integument appears unremarkable at this time. There are no open sores, no preulcerative lesions, no rash or signs of infection present.  Vascular: Dorsalis Pedis artery and Posterior Tibial artery pedal pulses are 2/4 bilateral with immedate capillary fill time. Pedal hair growth present. No varicosities and no lower extremity edema present bilateral.   Neruologic: Grossly intact via light touch bilateral. Vibratory intact via tuning fork bilateral. Protective threshold with Semmes Wienstein monofilament intact to all pedal sites bilateral. Patellar and Achilles deep tendon reflexes 2+ bilateral. No Babinski or clonus noted bilateral.  Palpable Mulder's click third interdigital space of the left foot.  Musculoskeletal: No gross boney pedal deformities bilateral. No pain, crepitus, or limitation noted with foot and ankle range of motion bilateral. Muscular strength 5/5 in all groups tested bilateral.  Gait: Unassisted, Nonantalgic.    Radiographs:  Radiographs taken today demonstrate osseously mature individual third metatarsal osteotomy with screw fixation is intact.  Assessment & Plan:   Assessment: Probable neuroma third interspace left.  Plan: Injected 10 mg of Kenalog to the point of maximal tenderness today to the third interdigital space of the left foot.  Tolerated procedure well we will follow-up with [facility] on an as-needed basis.     Aster Eckrich T. McAlester, [place]

## 2022-12-27 ENCOUNTER — Other Ambulatory Visit: Payer: Self-pay | Admitting: Internal Medicine

## 2022-12-27 DIAGNOSIS — R519 Headache, unspecified: Secondary | ICD-10-CM

## 2022-12-27 DIAGNOSIS — J329 Chronic sinusitis, unspecified: Secondary | ICD-10-CM

## 2022-12-29 ENCOUNTER — Ambulatory Visit
Admission: RE | Admit: 2022-12-29 | Discharge: 2022-12-29 | Disposition: A | Discharge status: Home / Self Care | Payer: Managed Care, Other (non HMO) | Source: Ambulatory Visit | Attending: Internal Medicine | Admitting: Internal Medicine

## 2022-12-29 DIAGNOSIS — J329 Chronic sinusitis, unspecified: Secondary | ICD-10-CM | POA: Insufficient documentation

## 2022-12-29 DIAGNOSIS — R519 Headache, unspecified: Secondary | ICD-10-CM | POA: Insufficient documentation

## 2023-06-06 ENCOUNTER — Encounter: Payer: Self-pay | Admitting: Occupational Therapy

## 2023-06-06 ENCOUNTER — Ambulatory Visit: Payer: Managed Care, Other (non HMO) | Attending: Orthopedic Surgery | Admitting: Occupational Therapy

## 2023-06-06 DIAGNOSIS — M25641 Stiffness of right hand, not elsewhere classified: Secondary | ICD-10-CM | POA: Insufficient documentation

## 2023-06-06 DIAGNOSIS — M65331 Trigger finger, right middle finger: Secondary | ICD-10-CM | POA: Diagnosis present

## 2023-06-06 DIAGNOSIS — L905 Scar conditions and fibrosis of skin: Secondary | ICD-10-CM | POA: Insufficient documentation

## 2023-06-06 DIAGNOSIS — M79641 Pain in right hand: Secondary | ICD-10-CM | POA: Insufficient documentation

## 2023-06-06 NOTE — Therapy (Signed)
OUTPATIENT OCCUPATIONAL THERAPY ORTHO EVALUATION  Patient Name: Mandy Torres MRN: [id number] DOB:13-Jul-1970, 52 y.o., female Today's Date: 06/06/2023  PCP: Dr Nemiah Commander REFERRING PROVIDER: Dr Rosita Kea  END OF SESSION:  OT End of Session - 06/06/23 1838     Visit Number 1    Number of Visits 8    Date for OT Re-Evaluation 07/18/23    OT Start Time 1645    OT Stop Time 1735    OT Time Calculation (min) 50 min    Activity Tolerance Patient tolerated treatment well    Behavior During Therapy WFL for tasks assessed/performed             Past Medical History:  Diagnosis Date   ADHD (attention deficit hyperactivity disorder)    Valinda Hoar   Allergy    Anemia    BRCA negative 05/27/2016   variant of uncertain significance found in BRCA2   Complication of anesthesia    Depression    DJD (degenerative joint disease), cervical    neurosurg - Dr. Darcus Austin at [facility]   Foot neuroma    GERD (gastroesophageal reflux disease)    Hiatal hernia    History of breast biopsy 05/2001   Hx MRSA infection    X 2-LAST HAD IN 2015   Hypertension    Hypothyroidism    Migraine    PONV (postoperative nausea and vomiting)    Thyroid disease 2017   TMJ disease    Past Surgical History:  Procedure Laterality Date   49 HOUR PH STUDY N/A 12/31/2015   Procedure: 24 HOUR PH STUDY;  Surgeon: Midge Minium, MD;  Location: [facility] ENDOSCOPY;  Service: Endoscopy;  Laterality: N/A;   APPENDECTOMY     BREAST SURGERY     normal biopsy 2002, left   CHOLECYSTECTOMY     ESOPHAGEAL MANOMETRY N/A 12/17/2015   Procedure: ESOPHAGEAL MANOMETRY (EM);  Surgeon: Midge Minium, MD;  Location: [facility] ENDOSCOPY;  Service: Endoscopy;  Laterality: N/A;   FACIAL RECONSTRUCTION SURGERY  2/88, 9/88, 2/89   HIATAL HERNIA REPAIR  02/03/2016   Effie Shy, M.D.   KNEE SURGERY  11/97 and 4/93   right   METATARSAL OSTEOTOMY WITH BUNIONECTOMY  2016   NASAL SINUS SURGERY  2006 and 1998   deviated septum, ethmoids, Dr. Sherrie Mustache  then [facility]   OOPHORECTOMY Right    Right and Left Foot Surgery  06/2016   Patient Active Problem List   Diagnosis Date Noted   Shoulder pain 03/18/2021   Swelling of limb 01/02/2021   Pain of left hand 11/26/2020   Hashimoto's thyroiditis 11/12/2020   Pain of left hip joint 09/13/2019   Concern about neurological disease without diagnosis 06/04/2019   Mood disorder (HCC) 06/04/2019   Ganglion cyst of volar aspect of left wrist 03/30/2019   Anal skin tag 04/14/2017   Encounter for screening colonoscopy 04/14/2017   Allergic rhinitis 02/14/2017   Cervical paraspinal muscle spasm 02/14/2017   Family history of breast cancer 11/12/2016   Esophageal reflux    History of methicillin resistant Staphylococcus aureus infection 11/26/2015   Fatigue 11/21/2014   Enlarged lymph node 04/12/2014   Hypothyroidism 04/02/2014   Prediabetes 01/25/2014   Morbid obesity (HCC) 01/25/2014   Abnormality of rectum 12/31/2011   Hyperlipidemia, familial, high LDL 06/23/2011   ADHD (attention deficit hyperactivity disorder)    Depression    Migraine    HTN (hypertension) 04/03/2011    ONSET DATE: 03/23/23  REFERRING DIAG: R 3rd trigger finger release  THERAPY  DIAG:  Trigger middle finger of right hand  Scar condition and fibrosis of skin  Pain in right hand  Stiffness of right hand, not elsewhere classified  Rationale for Evaluation and Treatment: Rehabilitation  SUBJECTIVE:   SUBJECTIVE STATEMENT: I did not had any trigger finger issues until after my carpal tunnel releases.  That was last year.  Then the right middle finger started triggering.  Had shots.  And then surgery.  And now still bothers me.  The left one wants to bother me too. Pt accompanied by: self  PERTINENT HISTORY: 05/11/23 DR Rosita Kea visit: The patient is a 52 year old female who had a right middle trigger finger release and cyst removal at the A1 pulley by Dr. Kennedy Bucker in the office on 03/23/2023.  Since the  procedure, she has been experiencing a grinding sensation and catching in the affected finger. She received an injection from [facility], [place], which provided very short-term relief, lasting approximately 2 weeks.   She reports pain when the PIP joint moves. She has a right middle finger grinding sensation. Her thumb has started to catch again. An x-ray of her left hand was conducted in 09/2022.  Refer to OT  PRECAUTIONS: None     WEIGHT BEARING RESTRICTIONS: No  PAIN:  Are you having pain? 6/10 at the 3rd PIP   FALLS: Has patient fallen in last 6 months? No  LIVING ENVIRONMENT: Lives with: lives with their spouse   PLOF: Work at [employer] in [place].  On the computer, phone and writing a lot.  Likes to shop in the free time.  PATIENT GOALS: Get the pain and triggering better in my right middle finger.  And prevent it in my left hand wher it is starting.    OBJECTIVE:  Note: Objective measures were completed at Evaluation unless otherwise noted.  HAND DOMINANCE: Right  A UPPER EXTREMITY ROM:     Active ROM Right eval Left eval  Shoulder flexion    Shoulder abduction    Shoulder adduction    Shoulder extension    Shoulder internal rotation    Shoulder external rotation    Elbow flexion    Elbow extension    Wrist flexion 85 90  Wrist extension 70 decrease PROM  70 ( PROM 90)  Wrist ulnar deviation    Wrist radial deviation    Wrist pronation    Wrist supination    (Blank rows = not tested)  Active ROM Right eval Left eval  Thumb MCP (0-60)    Thumb IP (0-80)    Thumb Radial abd/add (0-55)     Thumb Palmar abd/add (0-45)     Thumb Opposition to Small Finger     Index MCP (0-90) 80  90  Index PIP (0-100) 100 100   Index DIP (0-70)      Long MCP (0-90)  86  90  Long PIP (0-100)  100  100  Long DIP (0-70)      Ring MCP (0-90)  90  90  Ring PIP (0-100)  100  100  Ring DIP (0-70)      Little MCP (0-90)  90 90   Little PIP (0-100) 100   100   Little DIP (0-70)      (Blank rows = not tested)    HAND FUNCTION: Grip strength: Right: 45 PAIN  lbs; Left: 46 lbs, Lateral pinch: Right: 12 lbs, Left: 13 lbs, and 3 point pinch: Right: 8 pain  lbs, Left: 10 lbs  COORDINATION: WFL  SENSATION: none  EDEMA:over R 3rd digit and scar at [facility]- and L starting per pt in thumb and 3rd diigt  COGNITION: Overall cognitive status: Within functional limits for tasks assessed     TODAY'S TREATMENT:                                                                                                                              DATE: 06/06/23 Contrast done to right hand while fabricating MC block splint for third digit to use at nighttime as well as during the day for composite gripping to decrease composite fist.  To decrease triggering. Education done for scar massage as well as after contrast MC flexion with IP extension and intrinsic assist blocked 10 reps on both hands Active assisted range of motion for composite wrist and digit extension prayer stretch for bilateral hands pain-free. Reinforced with patient to avoid tight and prolonged grip.  Enlarge her handles and grips as well as using larger joints like palms and forearms when gripping and picking up objects. Isotoner glove in combination with MC block splint at nighttime to decrease triggering and third digit.   PATIENT EDUCATION: Education details: findings of eval and HEP  Person educated: Patient Education method: Explanation, Demonstration, Tactile cues, Verbal cues, and Handouts Education comprehension: verbalized understanding, returned demonstration, verbal cues required, and needs further education   GOALS: Goals reviewed with patient?   LONG TERM GOALS: Target date 6 wks   Patient to be independent in home program including modifications to task to decrease triggering in the mornings. Baseline: Patient no knowledge of home program as well as modifications.  Triggering at  nighttime in the mornings on the right hand. Goal status: INITIAL  2.  Pain in right third digit decreased to the most 2/10. Baseline: Pain at the PIP increases to 6/10 with functional use Goal status: INITIAL  3.  Right digit active range of motion as well as wrist extension increased to within normal limits with pain less than 2/10 Baseline: Right wrist extension 70 active range of motion passive 70 degrees with a pull over palm and carpal tunnel scar as well as trigger finger scar.  Decreased composite flexion with triggering of the third digit.  MC flexion 80-85 and second and third.  Pain 6/10 at third PIP Goal status: INITIAL  4.  Patient verbalized 3 adaptive equipment and modifications to decrease triggering in bilateral hands during the day and nighttime. Baseline: Patient reports her left hand starting to trigger at the thumb and the third digit.  Left third digit triggering with increased use as well as nighttime and mornings.  No knowledge of adaptive equipment or modifications. Goal status: INITIAL   ASSESSMENT:  CLINICAL IMPRESSION: Patient seen today for occupational therapy evaluation for right third digit trigger finger release and cyst on 03/23/2023.  Patient had a shot also in third digit since surgery.  Patient continues to have reports of increased pain  and swelling as well as triggering of third digit.  Pain increases 6/10 at right third digit PIP and tenderness over scar.  Decreased composite flexion with increased triggering in the mornings and with increased use.  Decreased composite wrist and digit extension on the right.  With increased pain with grip and 3-point pinch.  And decreased compared to left hand.  Patient had bilateral carpal tunnel releases in 2023.  Patient limited in functional use of right dominant hand in ADLs and IADLs.  Patient can benefit from skilled OT services to decrease scar tissue and edema and increase range of motion and strength to return to  prior level of function.  PERFORMANCE DEFICITS: in functional skills including ADLs, IADLs, ROM, strength, pain, flexibility, decreased knowledge of use of DME, and UE functional use,   and psychosocial skills including environmental adaptation and routines and behaviors.   IMPAIRMENTS: are limiting patient from ADLs, IADLs, rest and sleep, play, leisure, and social participation.   COMORBIDITIES: has no other co-morbidities that affects occupational performance. Patient will benefit from skilled OT to address above impairments and improve overall function.  MODIFICATION OR ASSISTANCE TO COMPLETE EVALUATION: No modification of tasks or assist necessary to complete an evaluation.  OT OCCUPATIONAL PROFILE AND HISTORY: Problem focused assessment: Including review of records relating to presenting problem.  CLINICAL DECISION MAKING: LOW - limited treatment options, no task modification necessary  REHAB POTENTIAL: Good for goals  EVALUATION COMPLEXITY: Low     CONSULTED AND AGREED WITH PLAN OF CARE: Patient     Oletta Cohn, OTR/L,CLT 06/06/2023, 6:41 PM

## 2023-06-13 ENCOUNTER — Ambulatory Visit: Payer: Managed Care, Other (non HMO) | Admitting: Occupational Therapy

## 2023-06-13 ENCOUNTER — Ambulatory Visit: Payer: Managed Care, Other (non HMO) | Attending: Orthopedic Surgery | Admitting: Occupational Therapy

## 2023-06-13 DIAGNOSIS — M79641 Pain in right hand: Secondary | ICD-10-CM | POA: Diagnosis present

## 2023-06-13 DIAGNOSIS — M65331 Trigger finger, right middle finger: Secondary | ICD-10-CM | POA: Insufficient documentation

## 2023-06-13 DIAGNOSIS — M25641 Stiffness of right hand, not elsewhere classified: Secondary | ICD-10-CM | POA: Insufficient documentation

## 2023-06-13 DIAGNOSIS — L905 Scar conditions and fibrosis of skin: Secondary | ICD-10-CM | POA: Insufficient documentation

## 2023-06-13 NOTE — Therapy (Signed)
OUTPATIENT OCCUPATIONAL THERAPY ORTHO TREATMENT  Patient Name: Mandy Torres MRN: [id number] DOB:January 08, 1971, 52 y.o., female Today's Date: 06/13/2023  PCP: Dr Nemiah Commander REFERRING PROVIDER: Dr Rosita Kea  END OF SESSION:  OT End of Session - 06/13/23 1831     Visit Number 2    Number of Visits 8    Date for OT Re-Evaluation 07/18/23    OT Start Time 1734    OT Stop Time 1814    OT Time Calculation (min) 40 min    Activity Tolerance Patient tolerated treatment well    Behavior During Therapy WFL for tasks assessed/performed             Past Medical History:  Diagnosis Date   ADHD (attention deficit hyperactivity disorder)    Valinda Hoar   Allergy    Anemia    BRCA negative 05/27/2016   variant of uncertain significance found in BRCA2   Complication of anesthesia    Depression    DJD (degenerative joint disease), cervical    neurosurg - Dr. Darcus Austin at [facility]   Foot neuroma    GERD (gastroesophageal reflux disease)    Hiatal hernia    History of breast biopsy 05/2001   Hx MRSA infection    X 2-LAST HAD IN 2015   Hypertension    Hypothyroidism    Migraine    PONV (postoperative nausea and vomiting)    Thyroid disease 2017   TMJ disease    Past Surgical History:  Procedure Laterality Date   80 HOUR PH STUDY N/A 12/31/2015   Procedure: 24 HOUR PH STUDY;  Surgeon: Midge Minium, MD;  Location: [facility] ENDOSCOPY;  Service: Endoscopy;  Laterality: N/A;   APPENDECTOMY     BREAST SURGERY     normal biopsy 2002, left   CHOLECYSTECTOMY     ESOPHAGEAL MANOMETRY N/A 12/17/2015   Procedure: ESOPHAGEAL MANOMETRY (EM);  Surgeon: Midge Minium, MD;  Location: [facility] ENDOSCOPY;  Service: Endoscopy;  Laterality: N/A;   FACIAL RECONSTRUCTION SURGERY  2/88, 9/88, 2/89   HIATAL HERNIA REPAIR  02/03/2016   Effie Shy, M.D.   KNEE SURGERY  11/97 and 4/93   right   METATARSAL OSTEOTOMY WITH BUNIONECTOMY  2016   NASAL SINUS SURGERY  2006 and 1998   deviated septum, ethmoids, Dr. Sherrie Mustache  then [facility]   OOPHORECTOMY Right    Right and Left Foot Surgery  06/2016   Patient Active Problem List   Diagnosis Date Noted   Shoulder pain 03/18/2021   Swelling of limb 01/02/2021   Pain of left hand 11/26/2020   Hashimoto's thyroiditis 11/12/2020   Pain of left hip joint 09/13/2019   Concern about neurological disease without diagnosis 06/04/2019   Mood disorder (HCC) 06/04/2019   Ganglion cyst of volar aspect of left wrist 03/30/2019   Anal skin tag 04/14/2017   Encounter for screening colonoscopy 04/14/2017   Allergic rhinitis 02/14/2017   Cervical paraspinal muscle spasm 02/14/2017   Family history of breast cancer 11/12/2016   Esophageal reflux    History of methicillin resistant Staphylococcus aureus infection 11/26/2015   Fatigue 11/21/2014   Enlarged lymph node 04/12/2014   Hypothyroidism 04/02/2014   Prediabetes 01/25/2014   Morbid obesity (HCC) 01/25/2014   Abnormality of rectum 12/31/2011   Hyperlipidemia, familial, high LDL 06/23/2011   ADHD (attention deficit hyperactivity disorder)    Depression    Migraine    HTN (hypertension) 04/03/2011    ONSET DATE: 03/23/23  REFERRING DIAG: R 3rd trigger finger release  THERAPY  DIAG:  Trigger middle finger of right hand  Scar condition and fibrosis of skin  Stiffness of right hand, not elsewhere classified  Pain in right hand  Rationale for Evaluation and Treatment: Rehabilitation  SUBJECTIVE:   SUBJECTIVE STATEMENT: My finger is still triggering and still hurts at times.  Pain is better 3-4/10.  Mostly weaned driving and holding the steering wheel and nighttime.  At work writing is better with the new pen -and I am trying to make things fatter bigger and using my palms. Pt accompanied by: self  PERTINENT HISTORY: 05/11/23 DR Rosita Kea visit: The patient is a 52 year old female who had a right middle trigger finger release and cyst removal at the A1 pulley by Dr. Kennedy Bucker in the office on  03/23/2023.  Since the procedure, she has been experiencing a grinding sensation and catching in the affected finger. She received an injection from [facility], [place], which provided very short-term relief, lasting approximately 2 weeks.   She reports pain when the PIP joint moves. She has a right middle finger grinding sensation. Her thumb has started to catch again. An x-ray of her left hand was conducted in 09/2022.  Refer to OT  PRECAUTIONS: None     WEIGHT BEARING RESTRICTIONS: No  PAIN:  Are you having pain? 3-4/10 at the 3rd PIP   FALLS: Has patient fallen in last 6 months? No  LIVING ENVIRONMENT: Lives with: lives with their spouse   PLOF: Work at [employer] in [place].  On the computer, phone and writing a lot.  Likes to shop in the free time.  PATIENT GOALS: Get the pain and triggering better in my right middle finger.  And prevent it in my left hand wher it is starting.    OBJECTIVE:  Note: Objective measures were completed at Evaluation unless otherwise noted.  HAND DOMINANCE: Right  A UPPER EXTREMITY ROM:     Active ROM Right eval Left eval  Shoulder flexion    Shoulder abduction    Shoulder adduction    Shoulder extension    Shoulder internal rotation    Shoulder external rotation    Elbow flexion    Elbow extension    Wrist flexion 85 90  Wrist extension 70 decrease PROM  70 ( PROM 90)  Wrist ulnar deviation    Wrist radial deviation    Wrist pronation    Wrist supination    (Blank rows = not tested)  Active ROM Right eval Left eval  Thumb MCP (0-60)    Thumb IP (0-80)    Thumb Radial abd/add (0-55)     Thumb Palmar abd/add (0-45)     Thumb Opposition to Small Finger     Index MCP (0-90) 80  90  Index PIP (0-100) 100 100   Index DIP (0-70)      Long MCP (0-90)  86  90  Long PIP (0-100)  100  100  Long DIP (0-70)      Ring MCP (0-90)  90  90  Ring PIP (0-100)  100  100  Ring DIP (0-70)      Little MCP (0-90)  90 90    Little PIP (0-100) 100   100  Little DIP (0-70)      (Blank rows = not tested)    HAND FUNCTION: Grip strength: Right: 45 PAIN  lbs; Left: 46 lbs, Lateral pinch: Right: 12 lbs, Left: 13 lbs, and 3 point pinch: Right: 8 pain  lbs, Left: 10 lbs  COORDINATION: WFL  SENSATION: none  EDEMA:over R 3rd digit and scar at [facility]- and L starting per pt in thumb and 3rd diigt  COGNITION: Overall cognitive status: Within functional limits for tasks assessed     TODAY'S TREATMENT:                                                                                                                              DATE: 06/13/23 Patient follow-up after being evaluated last week. Patient with pain decreased to 3-4/10 as well as less triggering. Pain not as constant. Patient to continue with Contrast to right hand in the morning and at the end of the day prior to home exercises.   Fabricating MC block splint for left third digit to use at nighttime as well as during the day for composite gripping to decrease composite fist.  To decrease triggering.  Done  scar massage using Graston tool #2 for brushing as well as mini massager over both scars on volar hand prior to  [facility] flexion with IP extension and intrinsic assist blocked 10 reps on both hands With composite fist.  Reinforced with patient light gentle range of motion no forceful gripping or squeezing.  Remind again active assisted range of motion for composite wrist and digit extension prayer stretch for bilateral hands pain-free. Reinforced with patient to avoid tight and prolonged grip.  Enlarge her handles and grips as well as using larger joints like palms and forearms when gripping and picking up objects. Isotoner glove in combination with MC block splint at nighttime to decrease triggering and third digit.   PATIENT EDUCATION: Education details: findings of eval and HEP  Person educated: Patient Education method: Explanation, Demonstration,  Tactile cues, Verbal cues, and Handouts Education comprehension: verbalized understanding, returned demonstration, verbal cues required, and needs further education   GOALS: Goals reviewed with patient?   LONG TERM GOALS: Target date 6 wks   Patient to be independent in home program including modifications to task to decrease triggering in the mornings. Baseline: Patient no knowledge of home program as well as modifications.  Triggering at nighttime in the mornings on the right hand. Goal status: INITIAL  2.  Pain in right third digit decreased to the most 2/10. Baseline: Pain at the PIP increases to 6/10 with functional use Goal status: INITIAL  3.  Right digit active range of motion as well as wrist extension increased to within normal limits with pain less than 2/10 Baseline: Right wrist extension 70 active range of motion passive 70 degrees with a pull over palm and carpal tunnel scar as well as trigger finger scar.  Decreased composite flexion with triggering of the third digit.  MC flexion 80-85 and second and third.  Pain 6/10 at third PIP Goal status: INITIAL  4.  Patient verbalized 3 adaptive equipment and modifications to decrease triggering in bilateral hands during the day and nighttime. Baseline: Patient reports her left hand starting to trigger  at the thumb and the third digit.  Left third digit triggering with increased use as well as nighttime and mornings.  No knowledge of adaptive equipment or modifications. Goal status: INITIAL   ASSESSMENT:  CLINICAL IMPRESSION: Patient seen for occupational therapy for right third digit trigger finger release and cyst on 03/23/2023.  Patient had a shot also in third digit since surgery.  Patient continues to have reports of increased pain and swelling as well as triggering of third digit.  Pain increases 6/10 at right third digit PIP and tenderness over scar.  Decreased composite flexion with increased triggering in the mornings and  with increased use.  Decreased composite wrist and digit extension on the right.  With increased pain with grip and 3-point pinch.  And decreased compared to left hand.  Patient had bilateral carpal tunnel releases in 2023.  Patient present today with pain decreasing as well as less episodes of triggering.  At nighttime left thumb and third digit wants to trigger.  Fabricated patient a MC block splint for third digit and can use a Band-Aid on the thumb for nighttime. Patient limited in functional use of right dominant hand in ADLs and IADLs.  Patient can benefit from skilled OT services to decrease scar tissue and edema and increase range of motion and strength to return to prior level of function.  PERFORMANCE DEFICITS: in functional skills including ADLs, IADLs, ROM, strength, pain, flexibility, decreased knowledge of use of DME, and UE functional use,   and psychosocial skills including environmental adaptation and routines and behaviors.   IMPAIRMENTS: are limiting patient from ADLs, IADLs, rest and sleep, play, leisure, and social participation.   COMORBIDITIES: has no other co-morbidities that affects occupational performance. Patient will benefit from skilled OT to address above impairments and improve overall function.  MODIFICATION OR ASSISTANCE TO COMPLETE EVALUATION: No modification of tasks or assist necessary to complete an evaluation.  OT OCCUPATIONAL PROFILE AND HISTORY: Problem focused assessment: Including review of records relating to presenting problem.  CLINICAL DECISION MAKING: LOW - limited treatment options, no task modification necessary  REHAB POTENTIAL: Good for goals  EVALUATION COMPLEXITY: Low     CONSULTED AND AGREED WITH PLAN OF CARE: Patient     Oletta Cohn, OTR/L,CLT 06/13/2023, 6:32 PM

## 2023-06-16 ENCOUNTER — Ambulatory Visit: Payer: Managed Care, Other (non HMO) | Admitting: Occupational Therapy

## 2023-06-20 ENCOUNTER — Ambulatory Visit: Payer: Managed Care, Other (non HMO) | Admitting: Occupational Therapy

## 2023-06-23 ENCOUNTER — Encounter: Payer: Managed Care, Other (non HMO) | Admitting: Occupational Therapy

## 2023-07-04 ENCOUNTER — Ambulatory Visit: Payer: Managed Care, Other (non HMO) | Admitting: Occupational Therapy

## 2023-08-25 ENCOUNTER — Ambulatory Visit: Payer: Managed Care, Other (non HMO) | Admitting: Obstetrics and Gynecology

## 2023-09-07 ENCOUNTER — Ambulatory Visit: Payer: Managed Care, Other (non HMO) | Admitting: Family Medicine

## 2023-09-28 ENCOUNTER — Ambulatory Visit: Payer: Managed Care, Other (non HMO) | Admitting: Family Medicine

## 2023-11-22 ENCOUNTER — Other Ambulatory Visit: Payer: Self-pay | Admitting: Internal Medicine

## 2023-11-22 DIAGNOSIS — R0609 Other forms of dyspnea: Secondary | ICD-10-CM

## 2023-12-09 ENCOUNTER — Other Ambulatory Visit: Payer: Self-pay | Admitting: Physician Assistant

## 2023-12-09 ENCOUNTER — Ambulatory Visit
Admission: RE | Admit: 2023-12-09 | Discharge: 2023-12-09 | Disposition: A | Discharge status: Home / Self Care | Source: Ambulatory Visit | Attending: Physician Assistant | Admitting: Physician Assistant

## 2023-12-09 ENCOUNTER — Inpatient Hospital Stay: Admission: RE | Admit: 2023-12-09 | Source: Ambulatory Visit

## 2023-12-09 DIAGNOSIS — R1011 Right upper quadrant pain: Secondary | ICD-10-CM | POA: Insufficient documentation

## 2023-12-09 DIAGNOSIS — R0609 Other forms of dyspnea: Secondary | ICD-10-CM

## 2023-12-09 DIAGNOSIS — M549 Dorsalgia, unspecified: Secondary | ICD-10-CM

## 2023-12-09 MED ORDER — IOHEXOL 350 MG/ML SOLN
75.0000 mL | Freq: Once | INTRAVENOUS | Status: AC | PRN
  Administered 2023-12-09: 75 mL via INTRAVENOUS

## 2024-02-02 ENCOUNTER — Encounter: Payer: Self-pay | Admitting: Gastroenterology

## 2024-02-13 ENCOUNTER — Ambulatory Visit

## 2024-02-13 ENCOUNTER — Ambulatory Visit
Admission: RE | Admit: 2024-02-13 | Discharge: 2024-02-13 | Disposition: A | Discharge status: Home / Self Care | Attending: Gastroenterology | Admitting: Gastroenterology

## 2024-02-13 ENCOUNTER — Encounter: Payer: Self-pay | Admitting: Gastroenterology

## 2024-02-13 ENCOUNTER — Encounter
Admission: RE | Disposition: A | Discharge status: Home / Self Care | Payer: Self-pay | Source: Home / Self Care | Attending: Gastroenterology

## 2024-02-13 DIAGNOSIS — E66813 Obesity, class 3: Secondary | ICD-10-CM | POA: Diagnosis not present

## 2024-02-13 DIAGNOSIS — Z7984 Long term (current) use of oral hypoglycemic drugs: Secondary | ICD-10-CM | POA: Diagnosis not present

## 2024-02-13 DIAGNOSIS — Z793 Long term (current) use of hormonal contraceptives: Secondary | ICD-10-CM | POA: Diagnosis not present

## 2024-02-13 DIAGNOSIS — Z79899 Other long term (current) drug therapy: Secondary | ICD-10-CM | POA: Insufficient documentation

## 2024-02-13 DIAGNOSIS — F32A Depression, unspecified: Secondary | ICD-10-CM | POA: Diagnosis not present

## 2024-02-13 DIAGNOSIS — I10 Essential (primary) hypertension: Secondary | ICD-10-CM | POA: Insufficient documentation

## 2024-02-13 DIAGNOSIS — R519 Headache, unspecified: Secondary | ICD-10-CM | POA: Insufficient documentation

## 2024-02-13 DIAGNOSIS — K2100 Gastro-esophageal reflux disease with esophagitis, without bleeding: Secondary | ICD-10-CM | POA: Diagnosis not present

## 2024-02-13 DIAGNOSIS — K224 Dyskinesia of esophagus: Secondary | ICD-10-CM | POA: Insufficient documentation

## 2024-02-13 DIAGNOSIS — Z6839 Body mass index (BMI) 39.0-39.9, adult: Secondary | ICD-10-CM | POA: Diagnosis not present

## 2024-02-13 DIAGNOSIS — K2980 Duodenitis without bleeding: Secondary | ICD-10-CM | POA: Diagnosis not present

## 2024-02-13 DIAGNOSIS — Z9889 Other specified postprocedural states: Secondary | ICD-10-CM | POA: Insufficient documentation

## 2024-02-13 DIAGNOSIS — K449 Diaphragmatic hernia without obstruction or gangrene: Secondary | ICD-10-CM | POA: Diagnosis not present

## 2024-02-13 DIAGNOSIS — K2289 Other specified disease of esophagus: Secondary | ICD-10-CM | POA: Diagnosis not present

## 2024-02-13 DIAGNOSIS — R1310 Dysphagia, unspecified: Secondary | ICD-10-CM | POA: Diagnosis present

## 2024-02-13 DIAGNOSIS — E063 Autoimmune thyroiditis: Secondary | ICD-10-CM | POA: Insufficient documentation

## 2024-02-13 DIAGNOSIS — K2970 Gastritis, unspecified, without bleeding: Secondary | ICD-10-CM | POA: Diagnosis not present

## 2024-02-13 HISTORY — DX: Trigger finger, left middle finger: M65.332

## 2024-02-13 HISTORY — DX: Autoimmune thyroiditis: E06.3

## 2024-02-13 HISTORY — DX: Pure hypercholesterolemia, unspecified: E78.00

## 2024-02-13 HISTORY — DX: Prediabetes: R73.03

## 2024-02-13 HISTORY — PX: MALONEY DILATION: SHX5535

## 2024-02-13 HISTORY — PX: ESOPHAGOGASTRODUODENOSCOPY: SHX5428

## 2024-02-13 HISTORY — DX: Allergic rhinitis, unspecified: J30.9

## 2024-02-13 SURGERY — EGD (ESOPHAGOGASTRODUODENOSCOPY)
Anesthesia: General

## 2024-02-13 MED ORDER — LIDOCAINE HCL (CARDIAC) PF 100 MG/5ML IV SOSY
PREFILLED_SYRINGE | INTRAVENOUS | Status: DC | PRN
  Administered 2024-02-13: 40 mg via INTRAVENOUS

## 2024-02-13 MED ORDER — SODIUM CHLORIDE 0.9 % IV SOLN
INTRAVENOUS | Status: DC
  Administered 2024-02-13: 20 mL/h via INTRAVENOUS

## 2024-02-13 MED ORDER — LIDOCAINE HCL (PF) 2 % IJ SOLN
INTRAMUSCULAR | Status: AC
Start: 2024-02-13 — End: 2024-02-13
  Filled 2024-02-13: qty 5

## 2024-02-13 MED ORDER — GLYCOPYRROLATE 0.2 MG/ML IJ SOLN
INTRAMUSCULAR | Status: AC
  Filled 2024-02-13: qty 1

## 2024-02-13 MED ORDER — PROPOFOL 10 MG/ML IV BOLUS
INTRAVENOUS | Status: DC | PRN
  Administered 2024-02-13: 150 mg via INTRAVENOUS
  Administered 2024-02-13: 50 mg via INTRAVENOUS
  Administered 2024-02-13: 20 mg via INTRAVENOUS

## 2024-02-13 MED ORDER — GLYCOPYRROLATE 0.2 MG/ML IJ SOLN
INTRAMUSCULAR | Status: DC | PRN
  Administered 2024-02-13: .2 mg via INTRAVENOUS

## 2024-02-13 NOTE — Anesthesia Preprocedure Evaluation (Signed)
 Anesthesia Evaluation  Patient identified by MRN, date of birth, ID band Patient awake    Reviewed: Allergy & Precautions, NPO status , Patient's Chart, lab work & pertinent test results  Airway Mallampati: III  TM Distance: <3 FB Neck ROM: full  Mouth opening: Limited Mouth Opening  Dental  (+) Teeth Intact   Pulmonary neg pulmonary ROS   Pulmonary exam normal        Cardiovascular Exercise Tolerance: Good hypertension, Pt. on medications negative cardio ROS Normal cardiovascular exam Rhythm:Regular Rate:Normal     Neuro/Psych  Headaches   Depression    negative neurological ROS  negative psych ROS   GI/Hepatic negative GI ROS, Neg liver ROS, hiatal hernia,GERD  Medicated,,  Endo/Other  negative endocrine ROS  Class 3 obesity  Renal/GU negative Renal ROS  negative genitourinary   Musculoskeletal   Abdominal  (+) + obese  Peds negative pediatric ROS (+)  Hematology negative hematology ROS (+)   Anesthesia Other Findings Past Medical History: No date: Acquired trigger finger of left middle finger No date: ADHD (attention deficit hyperactivity disorder)     Comment:  Hoy Lee No date: Allergic rhinitis No date: Allergy No date: Anemia 05/27/2016: BRCA negative     Comment:  variant of uncertain significance found in BRCA2 No date: Complication of anesthesia No date: Depression No date: DJD (degenerative joint disease), cervical     Comment:  neurosurg - Dr. Rocklin at [facility] No date: Foot neuroma No date: GERD (gastroesophageal reflux disease) No date: Hashimoto's thyroiditis No date: Hiatal hernia 05/2001: History of breast biopsy No date: Hx MRSA infection     Comment:  X 2-LAST HAD IN 2015 No date: Hypercholesterolemia No date: Hypertension No date: Hypothyroidism No date: Migraine No date: PONV (postoperative nausea and vomiting) No date: Pre-diabetes 2017: Thyroid  disease No date: TMJ  disease  Past Surgical History: 12/31/2015: 24 HOUR PH STUDY; N/A     Comment:  Procedure: 24 HOUR PH STUDY;  Surgeon: Rogelia Copping, MD;               Location: [facility] ENDOSCOPY;  Service: Endoscopy;                Laterality: N/A; No date: APPENDECTOMY No date: BREAST SURGERY     Comment:  normal biopsy 2002, left No date: CARPAL TUNNEL RELEASE; Bilateral No date: CHOLECYSTECTOMY No date: DILATION AND CURETTAGE OF UTERUS No date: ESOPHAGASTRIC NISSEN FUNDOPLASTY SINGLE PORT; N/A 12/17/2015: ESOPHAGEAL MANOMETRY; N/A     Comment:  Procedure: ESOPHAGEAL MANOMETRY (EM);  Surgeon: Rogelia Copping, MD;  Location: [facility] ENDOSCOPY;  Service: Endoscopy;              Laterality: N/A; No date: EXTERNAL FRONTAL ETHMOIDECTOMY; N/A 2/88, 9/88, 2/89: FACIAL RECONSTRUCTION SURGERY No date: HALLUX VALGUS CORRECTION; N/A 02/03/2016: HIATAL HERNIA REPAIR     Comment:  Ozell Mano, M.D. No date: HYSTEROSCOPY WITH D & C; N/A 11/97 and 4/93: KNEE SURGERY     Comment:  right No date: METATARSAL OSTEOTOMY; N/A 2016: METATARSAL OSTEOTOMY WITH BUNIONECTOMY 2006 and 1998: NASAL SINUS SURGERY     Comment:  deviated septum, ethmoids, Dr. Gasper then [place] No date: OOPHORECTOMY; Right 06/2016: Right and Left Foot Surgery No date: WISDOM TOOTH EXTRACTION; N/A  BMI    Body Mass Index: 39.25 kg/m      Reproductive/Obstetrics negative OB ROS  Anesthesia Physical Anesthesia Plan  ASA: 3  Anesthesia Plan: General   Post-op Pain Management:    Induction: Intravenous  PONV Risk Score and Plan: Propofol  infusion and TIVA  Airway Management Planned: Natural Airway and Nasal Cannula  Additional Equipment:   Intra-op Plan:   Post-operative Plan:   Informed Consent: I have reviewed the patients History and Physical, chart, labs and discussed the procedure including the risks, benefits and alternatives for the proposed anesthesia  with the patient or authorized representative who has indicated his/her understanding and acceptance.     Dental Advisory Given  Plan Discussed with: CRNA  Anesthesia Plan Comments:         Anesthesia Quick Evaluation

## 2024-02-13 NOTE — H&P (Signed)
 Pre-Procedure H&P   Patient ID: Mandy Torres is a 53 y.o. female.  Gastroenterology Provider: Elspeth Mandy Jungling, DO  Referring Provider: Delmar Torres PCP: Mandy Bail, MD  Date: 02/13/2024  HPI [name] is a 53 y.o. female who presents today for Esophagogastroduodenoscopy for gerd, hiatal hernia.  Some dysphagia to solid food. She notes some discomfort with swallowing and epigastric discomfort as well. Increasing reflux sx despite once daily ppi and nissen. PPI has been held the last 2 weeks in prep for her pH and M&M studies.  Underwent Nissen fundoplication 2017.  She has noted increasing reflux symptoms.  Not taking zepbound  EGD in 2018 normal esophagus and duodenum.  Gastritis present.  Barium swallow in June 2025 demonstrated reflux and esophageal dysmotility  Hemoglobin 12.8 MCV 94 platelet 276   Past Medical History:  Diagnosis Date   Acquired trigger finger of left middle finger    ADHD (attention deficit hyperactivity disorder)    Mandy Torres   Allergic rhinitis    Allergy    Anemia    BRCA negative 05/27/2016   variant of uncertain significance found in BRCA2   Complication of anesthesia    Depression    DJD (degenerative joint disease), cervical    neurosurg - Dr. Rocklin at [facility]   Foot neuroma    GERD (gastroesophageal reflux disease)    Hashimoto's thyroiditis    Hiatal hernia    History of breast biopsy 05/2001   Hx MRSA infection    X 2-LAST HAD IN 2015   Hypercholesterolemia    Hypertension    Hypothyroidism    Migraine    PONV (postoperative nausea and vomiting)    Pre-diabetes    Thyroid  disease 2017   TMJ disease     Past Surgical History:  Procedure Laterality Date   78 HOUR PH STUDY N/A 12/31/2015   Procedure: 24 HOUR PH STUDY;  Surgeon: Mandy Copping, MD;  Location: [facility] ENDOSCOPY;  Service: Endoscopy;  Laterality: N/A;   APPENDECTOMY     BREAST SURGERY     normal biopsy 2002, left   CARPAL  TUNNEL RELEASE Bilateral    CHOLECYSTECTOMY     DILATION AND CURETTAGE OF UTERUS     ESOPHAGASTRIC NISSEN FUNDOPLASTY SINGLE PORT N/A    ESOPHAGEAL MANOMETRY N/A 12/17/2015   Procedure: ESOPHAGEAL MANOMETRY (EM);  Surgeon: Mandy Copping, MD;  Location: [facility] ENDOSCOPY;  Service: Endoscopy;  Laterality: N/A;   EXTERNAL FRONTAL ETHMOIDECTOMY N/A    FACIAL RECONSTRUCTION SURGERY  2/88, 9/88, 2/89   HALLUX VALGUS CORRECTION N/A    HIATAL HERNIA REPAIR  02/03/2016   Mandy Torres, M.D.   HYSTEROSCOPY WITH D & C N/A    KNEE SURGERY  11/97 and 4/93   right   METATARSAL OSTEOTOMY N/A    METATARSAL OSTEOTOMY WITH BUNIONECTOMY  2016   NASAL SINUS SURGERY  2006 and 1998   deviated septum, ethmoids, Dr. Gasper then Mandy Torres   OOPHORECTOMY Right    Right and Left Foot Surgery  06/2016   WISDOM TOOTH EXTRACTION N/A     Family History Maternal aunt- crc No h/o GI disease or malignancy  Review of Systems  Constitutional:  Negative for activity change, appetite change, chills, diaphoresis, fatigue, fever and unexpected weight change.  HENT:  Positive for trouble swallowing. Negative for voice change.   Respiratory:  Negative for shortness of breath and wheezing.   Cardiovascular:  Negative for chest pain, palpitations and leg swelling.  Gastrointestinal:  Negative  for abdominal distention, abdominal pain, anal bleeding, blood in stool, constipation, diarrhea, nausea, rectal pain and vomiting.  Musculoskeletal:  Negative for arthralgias and myalgias.  Skin:  Negative for color change and pallor.  Neurological:  Negative for dizziness, syncope and weakness.  Psychiatric/Behavioral:  Negative for confusion.   All other systems reviewed and are negative.    Medications No current facility-administered medications on file prior to encounter.   Current Outpatient Medications on File Prior to Encounter  Medication Sig Dispense Refill   chlorhexidine (PERIDEX) 0.12 % solution   0   divalproex  (DEPAKOTE) 500 MG DR tablet 500 mg 2 (two) times daily.   1   fluticasone (FLONASE) 50 MCG/ACT nasal spray Place into the nose.     levothyroxine  (SYNTHROID ) 50 MCG tablet 1 tablet in the morning on an empty stomach     loratadine (CLARITIN) 10 MG tablet Take by mouth.     memantine (NAMENDA) 5 MG tablet   3   metFORMIN  (GLUCOPHAGE -XR) 500 MG 24 hr tablet 2 tablets with evening meal     metFORMIN  (GLUCOPHAGE -XR) 500 MG 24 hr tablet take 2 tablets by mouth with evening meal 180 tablet 4   methocarbamol  (ROBAXIN ) 750 MG tablet methocarbamol  750 mg tablet 90 tablet 3   ondansetron  (ZOFRAN ) 4 MG tablet Take 4 mg by mouth every 8 (eight) hours as needed for nausea or vomiting.     oxymetazoline (AFRIN NASAL SPRAY) 0.05 % nasal spray Afrin (oxymetazoline) 0.05 % nasal spray  ADMINISTER 1 SPRAY INTO BOTH NOSTRILS TWO TIMES DAILY FOR 3 DAYS     pseudoephedrine (SUDAFED) 60 MG tablet Take 60 mg by mouth every 4 (four) hours as needed for congestion.     rizatriptan  (MAXALT ) 10 MG tablet TAKE 1 TABLET BY MOUTH AS NEEDED. REPEAT IN 2 HOURS IF NEEDED. 9 tablet 5   venlafaxine  XR (EFFEXOR -XR) 75 MG 24 hr capsule Take 75 mg by mouth every morning.     albuterol (PROVENTIL) (2.5 MG/3ML) 0.083% nebulizer solution TAKE 3 MLS BY NEBULIZATION EVERY 6 HOURS AS NEEDED FOR WHEEZING 75 mL 0   Amantadine HCl 100 MG tablet Take by mouth.     Amantadine HCl 100 MG tablet Take by mouth.     ARIPiprazole (ABILIFY) 5 MG tablet TAKE 1 TABLET BY MOUTH DAILY 90 tablet 1   ARIPiprazole (ABILIFY) 5 MG tablet TAKE 1 TABLET BY MOUTH DAILY 90 tablet 1   azelastine (ASTELIN) 0.1 % nasal spray  (Patient not taking: Reported on 04/01/2022)  12   cetirizine (ZYRTEC) 10 MG tablet Take by mouth. (Patient not taking: Reported on 04/01/2022)     citalopram (CELEXA) 20 MG tablet TAKE 1 TABLET BY MOUTH ONCE DAILY 90 tablet 1   citalopram (CELEXA) 40 MG tablet TAKE 1 TABLET BY MOUTH ONCE DAILY WITH BREAKFAST 90 tablet 0   divalproex (DEPAKOTE  ER) 500 MG 24 hr tablet 1 tablet (Patient not taking: Reported on 04/01/2022)     divalproex (DEPAKOTE) 500 MG DR tablet TAKE 1 TABLET BY MOUTH TWICE DAILY 90 tablet 1   divalproex (DEPAKOTE) 500 MG DR tablet TAKE 1 TABLET BY MOUTH TWICE DAILY 90 tablet 1   docusate sodium (COLACE) 100 MG capsule Take by mouth. (Patient not taking: Reported on 04/01/2022)     Lactobacillus Rhamnosus, GG, (CULTURELLE) CAPS Take by mouth. (Patient not taking: Reported on 03/25/2021)     levothyroxine  (SYNTHROID ) 50 MCG tablet take 1 tablet by mouth in the morning on an empty  stomach 90 tablet 4   levothyroxine  (SYNTHROID , LEVOTHROID) 25 MCG tablet Take 50 mcg by mouth.  (Patient not taking: Reported on 03/25/2021)     memantine (NAMENDA) 5 MG tablet Take by mouth.     norethindrone -ethinyl estradiol (LOESTRIN) 1-20 MG-MCG tablet Take 1 tablet by mouth daily. (Patient not taking: Reported on 04/01/2022) 63 tablet 6   nystatin cream (MYCOSTATIN) Apply 1 application topically 2 (two) times daily. (Patient not taking: Reported on 04/01/2022)     OLANZapine (ZYPREXA) 2.5 MG tablet Take 2.5 mg by mouth at bedtime. (Patient not taking: Reported on 04/01/2022)     Probiotic Product (TRUBIOTICS PO) Take by mouth. (Patient not taking: Reported on 04/01/2022)     saccharomyces boulardii (FLORASTOR) 250 MG capsule Take by mouth. (Patient not taking: Reported on 04/01/2022)     SYNTHROID  50 MCG tablet TAKE 1 TABLET BY MOUTH IN THE MORNING ON AN EMPTY STOMACH 30 tablet 0   SYNTHROID  50 MCG tablet TAKE 1 TABLET BY MOUTH IN THE MORNING ON AN EMPTY STOMACH 30 30 tablet 5   traZODone (DESYREL) 50 MG tablet TAKE 1 TABLET BY MOUTH AT BEDTIME AS NEEDED 90 tablet 0   traZODone (DESYREL) 50 MG tablet TAKE 1 TABLET BY MOUTH ONCE DAILY AT BEDTIME AS NEEDED 90 tablet 0   triamcinolone  (NASACORT ) 55 MCG/ACT AERO nasal inhaler Place into the nose. (Patient not taking: Reported on 04/01/2022)     triamcinolone  (NASACORT ) 55 MCG/ACT AERO nasal inhaler 1  puff in each nostril (Patient not taking: Reported on 04/01/2022)     triamcinolone  cream (KENALOG ) 0.1 % Apply topically. (Patient not taking: Reported on 04/01/2022)      Pertinent medications related to GI and procedure were reviewed by me with the patient prior to the procedure   Current Facility-Administered Medications:    0.9 %  sodium chloride  infusion, , Intravenous, Continuous, Onita Mandy Sharper, DO, Last Rate: 20 mL/hr at 02/13/24 0818, 20 mL/hr at 02/13/24 0818  sodium chloride  20 mL/hr (02/13/24 0818)       Allergies  Allergen Reactions   Diazepam Other (See Comments)    Other Reaction: Other reaction Hyperactivity  Hyperactivity  Other Reaction: Other reaction   Oxycodone-Acetaminophen Nausea Only and Nausea And Vomiting    Also Endocet Also Endocet   Penicillins Hives and Nausea Only   Amphetamine-Dextroamphetamine Other (See Comments)    Hyperactivity, extra happy   Avelox [Moxifloxacin Hcl In Nacl] Hives   Cheese     FETA CHEESE ONLY-MIGRAINES   Endocet [Oxycodone-Acetaminophen] Nausea Only   Epitol  [Carbamazepine ] Hives    Severe hives and swelling   Levofloxacin Nausea And Vomiting   Levofloxacin Nausea Only   Moxifloxacin Hives   Niacin And Related     ONLY IF LARGE AMOUNTS IN FOOD ONLY-MIGRAINES   Percocet [Oxycodone-Acetaminophen] Nausea And Vomiting    Also Endocet   Ranitidine Nausea And Vomiting   Sulfa Antibiotics Hives   Topamax     congnitive impairmant   Topiramate Other (See Comments)    Cognitive impairment    Valium     Hyperactivity    Allergies were reviewed by me prior to the procedure  Objective   Body mass index is 39.25 kg/m. Vitals:   02/13/24 0807  BP: 108/76  Pulse: 83  Resp: 20  Temp: (!) 96 F (35.6 C)  TempSrc: Temporal  SpO2: 100%  Weight: 97.3 kg  Height: 5' 2 (1.575 m)     Physical Exam Vitals and nursing note reviewed.  Constitutional:      General: She is not in acute distress.    Appearance:  Normal appearance. She is obese. She is not ill-appearing, toxic-appearing or diaphoretic.  HENT:     Head: Normocephalic and atraumatic.     Nose: Nose normal.     Mouth/Throat:     Mouth: Mucous membranes are moist.     Pharynx: Oropharynx is clear.  Eyes:     General: No scleral icterus.    Extraocular Movements: Extraocular movements intact.  Cardiovascular:     Rate and Rhythm: Normal rate and regular rhythm.     Heart sounds: Normal heart sounds. No murmur heard.    No friction rub. No gallop.  Pulmonary:     Effort: Pulmonary effort is normal. No respiratory distress.     Breath sounds: Normal breath sounds. No wheezing, rhonchi or rales.  Abdominal:     General: Bowel sounds are normal. There is no distension.     Palpations: Abdomen is soft.     Tenderness: There is no abdominal tenderness. There is no guarding or rebound.  Musculoskeletal:     Cervical back: Neck supple.     Right lower leg: No edema.     Left lower leg: No edema.  Skin:    General: Skin is warm and dry.     Coloration: Skin is not jaundiced or pale.  Neurological:     General: No focal deficit present.     Mental Status: She is alert and oriented to person, place, and time. Mental status is at baseline.  Psychiatric:        Mood and Affect: Mood normal.        Behavior: Behavior normal.        Thought Content: Thought content normal.        Judgment: Judgment normal.      Assessment:  [name] is a 52 y.o. female  who presents today for Esophagogastroduodenoscopy for GERD, hiatal hernia evaluation.  Plan:  Esophagogastroduodenoscopy with possible intervention today  Esophagogastroduodenoscopy with possible biopsy, control of bleeding, polypectomy, and interventions as necessary has been discussed with the patient/patient representative. Informed consent was obtained from the patient/patient representative after explaining the indication, nature, and risks of the procedure  including but not limited to death, bleeding, perforation, missed neoplasm/lesions, cardiorespiratory compromise, and reaction to medications. Opportunity for questions was given and appropriate answers were provided. Patient/patient representative has verbalized understanding is amenable to undergoing the procedure.   Mandy Mandy Jungling, DO  [facility]  Portions of the record may have been created with voice recognition software. Occasional wrong-word or 'sound-a-like' substitutions may have occurred due to the inherent limitations of voice recognition software.  Read the chart carefully and recognize, using context, where substitutions may have occurred.

## 2024-02-13 NOTE — Interval H&P Note (Signed)
 History and Physical Interval Note: Preprocedure H&P from 02/13/24  was reviewed and there was no interval change after seeing and examining the patient.  Written consent was obtained from the patient after discussion of risks, benefits, and alternatives. Patient has consented to proceed with Esophagogastroduodenoscopy with possible intervention   02/13/2024 8:52 AM  Mandy Torres  has presented today for surgery, with the diagnosis of GERD,HIATAL HERNIA.  The various methods of treatment have been discussed with the patient and family. After consideration of risks, benefits and other options for treatment, the patient has consented to  Procedure(s) with comments: EGD (ESOPHAGOGASTRODUODENOSCOPY) (N/A) - DM as a surgical intervention.  The patient's history has been reviewed, patient examined, no change in status, stable for surgery.  I have reviewed the patient's chart and labs.  Questions were answered to the patient's satisfaction.     [name]

## 2024-02-13 NOTE — Op Note (Signed)
 [facility] Patient Name: Mandy Torres Procedure Date: 02/13/2024 8:43 AM MRN: [id number] Account #: [id number] Date of Birth: Dec 08, 1970 Admit Type: Outpatient Age: 53 Room: [facility] ENDO ROOM 2 Gender: Female Note Status: Finalized Instrument Name: Upper Endoscope [id number] Procedure:             Upper GI endoscopy Indications:           Dysphagia, Gastro-esophageal reflux disease Providers:             [name] DO, DO Medicines:             Monitored Anesthesia Care Complications:         No immediate complications. Estimated blood loss:                         Minimal. Procedure:             Pre-Anesthesia Assessment:                        - Prior to the procedure, a History and Physical was                         performed, and patient medications and allergies were                         reviewed. The patient is competent. The risks and                         benefits of the procedure and the sedation options and                         risks were discussed with the patient. All questions                         were answered and informed consent was obtained.                         Patient identification and proposed procedure were                         verified by the physician, the nurse, the anesthetist                         and the technician in the endoscopy suite. Mental                         Status Examination: alert and oriented. Airway                         Examination: normal oropharyngeal airway and neck                         mobility. Respiratory Examination: clear to                         auscultation. CV Examination: RRR, no murmurs, no S3                         or S4. Prophylactic Antibiotics: The patient  does not                         require prophylactic antibiotics. Prior                         Anticoagulants: The patient has taken no anticoagulant                         or antiplatelet  agents. ASA Grade Assessment: III - A                         patient with severe systemic disease. After reviewing                         the risks and benefits, the patient was deemed in                         satisfactory condition to undergo the procedure. The                         anesthesia plan was to use monitored anesthesia care                         (MAC). Immediately prior to administration of                         medications, the patient was re-assessed for adequacy                         to receive sedatives. The heart rate, respiratory                         rate, oxygen saturations, blood pressure, adequacy of                         pulmonary ventilation, and response to care were                         monitored throughout the procedure. The physical                         status of the patient was re-assessed after the                         procedure.                        After obtaining informed consent, the endoscope was                         passed under direct vision. Throughout the procedure,                         the patient's blood pressure, pulse, and oxygen                         saturations were monitored continuously. The Endoscope  was introduced through the mouth, and advanced to the                         second part of duodenum. The upper GI endoscopy was                         accomplished without difficulty. The patient tolerated                         the procedure well. Findings:      Localized mild inflammation characterized by erythema and friability was       found in the second portion of the duodenum. Biopsies for histology were       taken with a cold forceps for evaluation of celiac disease. Estimated       blood loss was minimal.      The exam of the duodenum was otherwise normal.      Scattered moderate inflammation characterized by erosions and erythema       was found in the gastric body and in  the gastric antrum. Biopsies were       taken with a cold forceps for Helicobacter pylori testing. Estimated       blood loss was minimal.      Evidence of a prior Nissen fundoplication was found in the cardia. This       was characterized by healthy appearing mucosa and an intact appearance.       Estimated blood loss: none.      The exam of the stomach was otherwise normal.      The Z-line was variable and was found <1cm in length.      Esophagogastric landmarks were identified: the gastroesophageal junction       was found at 35 cm from the incisors.      Abnormal motility was noted in the esophagus. The cricopharyngeus was       normal. There is spasticity of the esophageal body. The distal       esophagus/lower esophageal sphincter is spastic, but gives up passage to       the endoscope. Biopsies were obtained from the proximal and distal       esophagus with cold forceps for histology of suspected eosinophilic       esophagitis. Estimated blood loss was minimal.      The examined esophagus was normal. The scope was withdrawn. Dilation was       performed with a Maloney dilator with mild resistance at 52 Fr. The       dilation site was examined following endoscope reinsertion and showed no       change. Estimated blood loss: none.      The exam of the esophagus was otherwise normal. Impression:            - Duodenitis. Biopsied.                        - Gastritis. Biopsied.                        - A Nissen fundoplication was found, characterized by                         an intact appearance and healthy appearing mucosa.                        -  Z-line variable, <1cm in length.                        - Esophagogastric landmarks identified.                        - Abnormal esophageal motility, suspicious for                         esophageal spasm.                        - Normal esophagus. Dilated.                        - Biopsies were taken with a cold forceps for                          evaluation of eosinophilic esophagitis. Recommendation:        - Patient has a contact number available for                         emergencies. The signs and symptoms of potential                         delayed complications were discussed with the patient.                         Return to normal activities tomorrow. Written                         discharge instructions were provided to the patient.                        - Discharge patient to home.                        - Resume previous diet.                        - Continue present medications.                        - Await pathology results.                        - Return to GI clinic as previously scheduled.                        - The findings and recommendations were discussed with                         the patient. Procedure Code(s):     --- Professional ---                        [phone], Esophagogastroduodenoscopy, flexible,                         transoral; with biopsy, single or multiple                        [id number],  Dilation of esophagus, by unguided sound or                         bougie, single or multiple passes Diagnosis Code(s):     --- Professional ---                        K29.80, Duodenitis without bleeding                        K29.70, Gastritis, unspecified, without bleeding                        Z98.890, Other specified postprocedural states                        K22.89, Other specified disease of esophagus                        K22.4, Dyskinesia of esophagus                        R13.10, Dysphagia, unspecified                        K21.9, Gastro-esophageal reflux disease without                         esophagitis CPT copyright 2022 [employer] Association. All rights reserved. The codes documented in this report are preliminary and upon coder review may  be revised to meet current compliance requirements. Attending Participation:      I personally performed the entire  procedure. Elspeth Jungling, DO [name] DO, DO 02/13/2024 9:16:05 AM This report has been signed electronically. Number of Addenda: 0 Note Initiated On: 02/13/2024 8:43 AM Estimated Blood Loss:  Estimated blood loss was minimal.      [facility]

## 2024-02-13 NOTE — Transfer of Care (Signed)
 Immediate Anesthesia Transfer of Care Note  Patient: Mandy Torres  Procedure(s) Performed: EGD (ESOPHAGOGASTRODUODENOSCOPY)  Patient Location: Endoscopy Unit  Anesthesia Type:General  Level of Consciousness: drowsy  Airway & Oxygen Therapy: Patient Spontanous Breathing  Post-op Assessment: Report given to RN  Post vital signs: stable  Last Vitals:  Vitals Value Taken Time  BP 131/73 02/13/24 09:12  Temp    Pulse 104 02/13/24 09:12  Resp 12 02/13/24 09:12  SpO2 97 % 02/13/24 09:12  Vitals shown include unfiled device data.  Last Pain:  Vitals:   02/13/24 0912  TempSrc:   PainSc: 0-No pain         Complications: No notable events documented.

## 2024-02-13 NOTE — Anesthesia Postprocedure Evaluation (Signed)
 Anesthesia Post Note  Patient: Mandy Torres  Procedure(s) Performed: EGD (ESOPHAGOGASTRODUODENOSCOPY)  Anesthesia Type: General Anesthetic complications: no   No notable events documented.   Last Vitals:  Vitals:   02/13/24 0912 02/13/24 0920  BP: 131/73   Pulse:    Resp: 16   Temp:  (!) 35.6 C  SpO2: 97%     Last Pain:  Vitals:   02/13/24 0937  TempSrc:   PainSc: 0-No pain                 VAN STAVEREN,Caroleann Casler

## 2024-02-14 ENCOUNTER — Encounter: Payer: Self-pay | Admitting: Gastroenterology

## 2024-02-14 LAB — SURGICAL PATHOLOGY

## 2024-03-10 ENCOUNTER — Encounter (HOSPITAL_BASED_OUTPATIENT_CLINIC_OR_DEPARTMENT_OTHER): Payer: Self-pay | Admitting: Emergency Medicine

## 2024-03-10 ENCOUNTER — Emergency Department (HOSPITAL_BASED_OUTPATIENT_CLINIC_OR_DEPARTMENT_OTHER)
Admission: EM | Admit: 2024-03-10 | Discharge: 2024-03-10 | Disposition: A | Discharge status: Home / Self Care | Attending: Emergency Medicine | Admitting: Emergency Medicine

## 2024-03-10 DIAGNOSIS — M79662 Pain in left lower leg: Secondary | ICD-10-CM | POA: Insufficient documentation

## 2024-03-10 MED ORDER — ENOXAPARIN (LOVENOX) PATIENT EDUCATION KIT: PACK | Freq: Once | Status: DC

## 2024-03-10 MED ORDER — ENOXAPARIN SODIUM 100 MG/ML IJ SOSY
1.0000 mg/kg | PREFILLED_SYRINGE | Freq: Once | INTRAMUSCULAR | Status: AC
  Administered 2024-03-10: 100 mg via SUBCUTANEOUS
  Filled 2024-03-10: qty 1

## 2024-03-10 NOTE — ED Triage Notes (Signed)
 Left hip pain Goes down into calf X 1 week worse today

## 2024-03-10 NOTE — Discharge Instructions (Addendum)
 You were seen in the emergency department for left calf pain We are concerned for a blood clot in the left calf but we do not have a ultrasound here tonight to look for a blood clot We gave you a dose of Lovenox  which is a medication to treat blood clots while you were here This will cover you for tonight but you need to return to this facility tomorrow for an ultrasound We have ultrasound available here from 1:00 PM on Return tomorrow and let them know we have placed the order for ultrasound

## 2024-03-10 NOTE — ED Provider Notes (Signed)
 [place] EMERGENCY DEPARTMENT AT [facility] Provider Note   CSN: [id number] Arrival date & time: 03/10/24  1849     Patient presents with: Hip Pain   Mandy Torres is a 53 y.o. female.  With a history of Hashimoto's and venous insufficiency who presents to the ED for left lower extremity pain.  Chronic pain and left hip which is not new but over the last week she has had left calf pain and swelling which is new.  Not typical for her osteoarthritis.  No inciting injury or trauma.  She is not on any blood thinning medications    Hip Pain       Prior to Admission medications   Medication Sig Start Date End Date Taking? Authorizing Provider  albuterol (PROVENTIL) (2.5 MG/3ML) 0.083% nebulizer solution TAKE 3 MLS BY NEBULIZATION EVERY 6 HOURS AS NEEDED FOR WHEEZING 07/29/20 07/29/21  Sherial Bail, MD  Amantadine HCl 100 MG tablet Take by mouth. 12/10/20 03/10/21  [provider]  Amantadine HCl 100 MG tablet Take by mouth. 03/29/22 04/28/22  [provider]  ARIPiprazole (ABILIFY) 5 MG tablet TAKE 1 TABLET BY MOUTH DAILY 09/19/20 09/19/21  Kapur, Aarti K, MD  ARIPiprazole (ABILIFY) 5 MG tablet TAKE 1 TABLET BY MOUTH DAILY 05/09/20 05/09/21  Kapur, Aarti K, MD  azelastine (ASTELIN) 0.1 % nasal spray  05/05/18   [provider]  cetirizine (ZYRTEC) 10 MG tablet Take by mouth. Patient not taking: Reported on 04/01/2022    [provider]  chlorhexidine (PERIDEX) 0.12 % solution  02/03/18   [provider]  citalopram (CELEXA) 20 MG tablet TAKE 1 TABLET BY MOUTH ONCE DAILY 02/05/20 02/04/21  Kapur, Aarti K, MD  citalopram (CELEXA) 40 MG tablet TAKE 1 TABLET BY MOUTH ONCE DAILY WITH BREAKFAST 06/27/20 06/27/21  Kapur, Aarti K, MD  divalproex (DEPAKOTE ER) 500 MG 24 hr tablet 1 tablet Patient not taking: Reported on 04/01/2022    [provider]  divalproex (DEPAKOTE) 500 MG DR tablet 500 mg 2 (two) times daily.  04/17/18    [provider]  divalproex (DEPAKOTE) 500 MG DR tablet TAKE 1 TABLET BY MOUTH TWICE DAILY 09/19/20 09/19/21  Kapur, Aarti K, MD  divalproex (DEPAKOTE) 500 MG DR tablet TAKE 1 TABLET BY MOUTH TWICE DAILY 05/09/20 05/09/21  Kapur, Aarti K, MD  docusate sodium (COLACE) 100 MG capsule Take by mouth. Patient not taking: Reported on 04/01/2022    [provider]  fluticasone (FLONASE) 50 MCG/ACT nasal spray Place into the nose.    [provider]  Lactobacillus Rhamnosus, GG, (CULTURELLE) CAPS Take by mouth. Patient not taking: Reported on 03/25/2021    [provider]  levothyroxine  (SYNTHROID ) 50 MCG tablet 1 tablet in the morning on an empty stomach    [provider]  levothyroxine  (SYNTHROID ) 50 MCG tablet take 1 tablet by mouth in the morning on an empty stomach 02/27/21     levothyroxine  (SYNTHROID , LEVOTHROID) 25 MCG tablet Take 50 mcg by mouth.  Patient not taking: Reported on 03/25/2021    [provider]  loratadine (CLARITIN) 10 MG tablet Take by mouth.    [provider]  memantine [facility]) 5 MG tablet  05/26/18   [provider]  memantine [facility]) 5 MG tablet Take by mouth. 12/05/20 03/05/21  [provider]  metFORMIN  (GLUCOPHAGE -XR) 500 MG 24 hr tablet 2 tablets with evening meal 08/29/20   [provider]  metFORMIN  (GLUCOPHAGE -XR) 500 MG 24 hr tablet take  2 tablets by mouth with evening meal 02/27/21     methocarbamol  (ROBAXIN ) 750 MG tablet methocarbamol  750 mg tablet 06/04/19   Aron, Talia M, MD  norethindrone -ethinyl estradiol (LOESTRIN) 1-20 MG-MCG tablet Take 1 tablet by mouth daily. Patient not taking: Reported on 04/01/2022 03/25/21   Weinhold, Samantha C, CNM  nystatin cream (MYCOSTATIN) Apply 1 application topically 2 (two) times daily. Patient not taking: Reported on 04/01/2022 08/08/20   [provider]  OLANZapine (ZYPREXA) 2.5 MG tablet Take 2.5 mg by mouth at bedtime. Patient not  taking: Reported on 04/01/2022    [provider]  ondansetron  (ZOFRAN ) 4 MG tablet Take 4 mg by mouth every 8 (eight) hours as needed for nausea or vomiting.    [provider]  oxymetazoline (AFRIN NASAL SPRAY) 0.05 % nasal spray Afrin (oxymetazoline) 0.05 % nasal spray  ADMINISTER 1 SPRAY INTO BOTH NOSTRILS TWO TIMES DAILY FOR 3 DAYS    [provider]  Probiotic Product (TRUBIOTICS PO) Take by mouth. Patient not taking: Reported on 04/01/2022    [provider]  pseudoephedrine (SUDAFED) 60 MG tablet Take 60 mg by mouth every 4 (four) hours as needed for congestion.    [provider]  rizatriptan  (MAXALT ) 10 MG tablet TAKE 1 TABLET BY MOUTH AS NEEDED. REPEAT IN 2 HOURS IF NEEDED. 07/27/18   Aron, Talia M, MD  saccharomyces boulardii (FLORASTOR) 250 MG capsule Take by mouth. Patient not taking: Reported on 04/01/2022    [provider]  SYNTHROID  50 MCG tablet TAKE 1 TABLET BY MOUTH IN THE MORNING ON AN EMPTY STOMACH 08/13/20 08/13/21  Faythe Purchase, MD  SYNTHROID  50 MCG tablet TAKE 1 TABLET BY MOUTH IN THE MORNING ON AN EMPTY STOMACH 30 02/01/20 01/31/21  Faythe Purchase, MD  traZODone (DESYREL) 50 MG tablet TAKE 1 TABLET BY MOUTH AT BEDTIME AS NEEDED 09/19/20 09/19/21  Kapur, Aarti K, MD  traZODone (DESYREL) 50 MG tablet TAKE 1 TABLET BY MOUTH ONCE DAILY AT BEDTIME AS NEEDED 06/27/20 06/27/21  Kapur, Aarti K, MD  triamcinolone  (NASACORT ) 55 MCG/ACT AERO nasal inhaler Place into the nose. Patient not taking: Reported on 04/01/2022    [provider]  triamcinolone  (NASACORT ) 55 MCG/ACT AERO nasal inhaler 1 puff in each nostril Patient not taking: Reported on 04/01/2022    [provider]  triamcinolone  cream (KENALOG ) 0.1 % Apply topically. Patient not taking: Reported on 04/01/2022 08/08/20   [provider]  venlafaxine  XR (EFFEXOR -XR) 75 MG 24 hr capsule Take 75 mg by mouth every morning.    [provider]     Allergies: Diazepam, Oxycodone-acetaminophen, Penicillins, Amphetamine-dextroamphetamine, Avelox [moxifloxacin hcl in nacl], Cheese, Endocet [oxycodone-acetaminophen], Epitol  [carbamazepine ], Levofloxacin, Levofloxacin, Moxifloxacin, Niacin and related, Percocet [oxycodone-acetaminophen], Ranitidine, Sulfa antibiotics, Topamax, Topiramate, and Valium    Review of Systems  Updated Vital Signs BP (!) 159/91 (BP Location: Right Arm)   Pulse (!) 101   Temp 98.4 F (36.9 C) (Oral)   Resp 16   Wt 99.8 kg   LMP 03/09/2021 (Approximate)   SpO2 100%   BMI 40.24 kg/m   Physical Exam Vitals and nursing note reviewed.  HENT:     Head: Normocephalic and atraumatic.  Eyes:     Pupils: Pupils are equal, round, and reactive to light.  Cardiovascular:     Rate and Rhythm: Normal rate and regular rhythm.  Pulmonary:     Effort: Pulmonary effort is normal.     Breath sounds: Normal breath sounds.  Abdominal:  Palpations: Abdomen is soft.     Tenderness: There is no abdominal tenderness.  Musculoskeletal:     Comments: Tenderness to deep palpation over left distal gastrocnemius No palpable cords No ecchymosis No appreciable swelling No erythema or increased warmth 2+ DP pulses bilaterally Full active range of motion with flexion of the knees and both hips.  Skin:    General: Skin is warm and dry.  Neurological:     Mental Status: She is alert.  Psychiatric:        Mood and Affect: Mood normal.     (all labs ordered are listed, but only abnormal results are displayed) Labs Reviewed - No data to display  EKG: None  Radiology: No results found.   Procedures   Medications Ordered in the ED  enoxaparin  (LOVENOX ) patient education kit (has no administration in time range)  enoxaparin  (LOVENOX ) injection 100 mg (has no administration in time range)                                    Medical Decision Making 53 year old female here for atraumatic left calf pain.   Reports pain swelling.  Tenderness on my exam.  No inciting trauma.  No erythema.  Concerning for left lower extremity DVT.  No ultrasound available at this hour at this facility.  Will give a dose of Lovenox  instructed to come back.  Outpatient referral order placed for the site tomorrow  Risk Prescription drug management.        Final diagnoses:  Pain of left calf    ED Discharge Orders          Ordered    Lower Ext Left Venous US        Comments: IMPORTANT PATIENT INSTRUCTIONS:  You have been scheduled for an Outpatient Ultrasound.    Your appointment has been scheduled for:  _______ am/pm on _______________ (date).  If your appointment is scheduled for a Saturday, Sunday or holiday, please go to the [facility] Emergency Department Registration Desk at least 15 minutes prior to your appointment time and tell them you are there for an ultrasound.    If your appointment is scheduled for a weekday (Monday - Friday), please go directly to the [facility] Radiology Department reception area at least 15 minutes prior to your appointment time and tell them you are there for an ultrasound.  Please call [phone] with questions.   03/10/24 2056               Pamella Ozell LABOR, DO 03/10/24 2105

## 2024-03-11 ENCOUNTER — Ambulatory Visit (HOSPITAL_BASED_OUTPATIENT_CLINIC_OR_DEPARTMENT_OTHER)
Admission: RE | Admit: 2024-03-11 | Discharge: 2024-03-11 | Disposition: A | Discharge status: Home / Self Care | Source: Ambulatory Visit | Attending: Emergency Medicine | Admitting: Emergency Medicine

## 2024-03-11 DIAGNOSIS — M79662 Pain in left lower leg: Secondary | ICD-10-CM | POA: Diagnosis present

## 2024-03-11 DIAGNOSIS — M7989 Other specified soft tissue disorders: Secondary | ICD-10-CM | POA: Insufficient documentation

## 2024-04-24 ENCOUNTER — Other Ambulatory Visit: Payer: Self-pay | Admitting: Surgery

## 2024-04-24 DIAGNOSIS — M25561 Pain in right knee: Secondary | ICD-10-CM

## 2024-04-24 DIAGNOSIS — M1731 Unilateral post-traumatic osteoarthritis, right knee: Secondary | ICD-10-CM

## 2024-04-24 DIAGNOSIS — S83231A Complex tear of medial meniscus, current injury, right knee, initial encounter: Secondary | ICD-10-CM

## 2024-04-27 ENCOUNTER — Ambulatory Visit
Admission: RE | Admit: 2024-04-27 | Discharge: 2024-04-27 | Disposition: A | Source: Ambulatory Visit | Attending: Surgery

## 2024-04-27 DIAGNOSIS — S83231A Complex tear of medial meniscus, current injury, right knee, initial encounter: Secondary | ICD-10-CM | POA: Insufficient documentation

## 2024-04-27 DIAGNOSIS — M25561 Pain in right knee: Secondary | ICD-10-CM | POA: Insufficient documentation

## 2024-04-27 DIAGNOSIS — M1731 Unilateral post-traumatic osteoarthritis, right knee: Secondary | ICD-10-CM | POA: Diagnosis present
